# Patient Record
Sex: Male | Born: 1965 | Race: Black or African American | Hispanic: No | Marital: Single | Smoking: Former smoker
Health system: Southern US, Community
[De-identification: ages and names within clinical notes are randomized; demographics above are authoritative.]

## PROBLEM LIST (undated history)

## (undated) DIAGNOSIS — J449 Chronic obstructive pulmonary disease, unspecified: Secondary | ICD-10-CM

## (undated) DIAGNOSIS — I10 Essential (primary) hypertension: Secondary | ICD-10-CM

## (undated) DIAGNOSIS — F32A Depression, unspecified: Secondary | ICD-10-CM

## (undated) DIAGNOSIS — J45909 Unspecified asthma, uncomplicated: Secondary | ICD-10-CM

## (undated) DIAGNOSIS — T7840XA Allergy, unspecified, initial encounter: Secondary | ICD-10-CM

## (undated) HISTORY — DX: Chronic obstructive pulmonary disease, unspecified: J44.9

## (undated) HISTORY — DX: Depression, unspecified: F32.A

## (undated) HISTORY — DX: Unspecified asthma, uncomplicated: J45.909

## (undated) HISTORY — PX: HERNIA REPAIR: SHX51

## (undated) HISTORY — DX: Allergy, unspecified, initial encounter: T78.40XA

---

## 2003-01-01 ENCOUNTER — Emergency Department (HOSPITAL_COMMUNITY): Admission: EM | Admit: 2003-01-01 | Discharge: 2003-01-01 | Payer: Self-pay | Admitting: *Deleted

## 2003-01-02 ENCOUNTER — Inpatient Hospital Stay (HOSPITAL_COMMUNITY): Admission: EM | Admit: 2003-01-02 | Discharge: 2003-01-03 | Payer: Self-pay | Admitting: Internal Medicine

## 2003-03-10 ENCOUNTER — Emergency Department (HOSPITAL_COMMUNITY): Admission: EM | Admit: 2003-03-10 | Discharge: 2003-03-10 | Payer: Self-pay | Admitting: Emergency Medicine

## 2003-05-22 ENCOUNTER — Emergency Department (HOSPITAL_COMMUNITY): Admission: EM | Admit: 2003-05-22 | Discharge: 2003-05-22 | Payer: Self-pay | Admitting: Emergency Medicine

## 2003-05-23 ENCOUNTER — Emergency Department (HOSPITAL_COMMUNITY): Admission: EM | Admit: 2003-05-23 | Discharge: 2003-05-23 | Payer: Self-pay | Admitting: *Deleted

## 2003-05-30 ENCOUNTER — Emergency Department (HOSPITAL_COMMUNITY): Admission: EM | Admit: 2003-05-30 | Discharge: 2003-05-30 | Payer: Self-pay | Admitting: *Deleted

## 2004-05-16 ENCOUNTER — Emergency Department (HOSPITAL_COMMUNITY): Admission: EM | Admit: 2004-05-16 | Discharge: 2004-05-16 | Payer: Self-pay | Admitting: Emergency Medicine

## 2004-07-24 ENCOUNTER — Emergency Department (HOSPITAL_COMMUNITY): Admission: EM | Admit: 2004-07-24 | Discharge: 2004-07-24 | Payer: Self-pay | Admitting: Emergency Medicine

## 2004-09-13 ENCOUNTER — Emergency Department (HOSPITAL_COMMUNITY): Admission: EM | Admit: 2004-09-13 | Discharge: 2004-09-13 | Payer: Self-pay | Admitting: Emergency Medicine

## 2004-09-19 ENCOUNTER — Emergency Department (HOSPITAL_COMMUNITY): Admission: EM | Admit: 2004-09-19 | Discharge: 2004-09-19 | Payer: Self-pay | Admitting: Emergency Medicine

## 2005-03-17 ENCOUNTER — Emergency Department (HOSPITAL_COMMUNITY): Admission: EM | Admit: 2005-03-17 | Discharge: 2005-03-17 | Payer: Self-pay | Admitting: Emergency Medicine

## 2005-06-11 ENCOUNTER — Emergency Department (HOSPITAL_COMMUNITY): Admission: EM | Admit: 2005-06-11 | Discharge: 2005-06-11 | Payer: Self-pay | Admitting: Emergency Medicine

## 2005-10-19 ENCOUNTER — Emergency Department (HOSPITAL_COMMUNITY): Admission: EM | Admit: 2005-10-19 | Discharge: 2005-10-19 | Payer: Self-pay | Admitting: Emergency Medicine

## 2007-04-20 IMAGING — CR DG LUMBAR SPINE COMPLETE 4+V
5 series · 5 of 5 positions shown · non-contrast
Comparison: none

CLINICAL DATA: Low back pain for 3 days.  No known injury.
 THORACIC SPINE - 3 VIEW:
 There is no evidence of thoracic spine fracture.  Alignment is normal.  No other significant bone abnormalities are identified.

[view not recorded (1 of 5)]
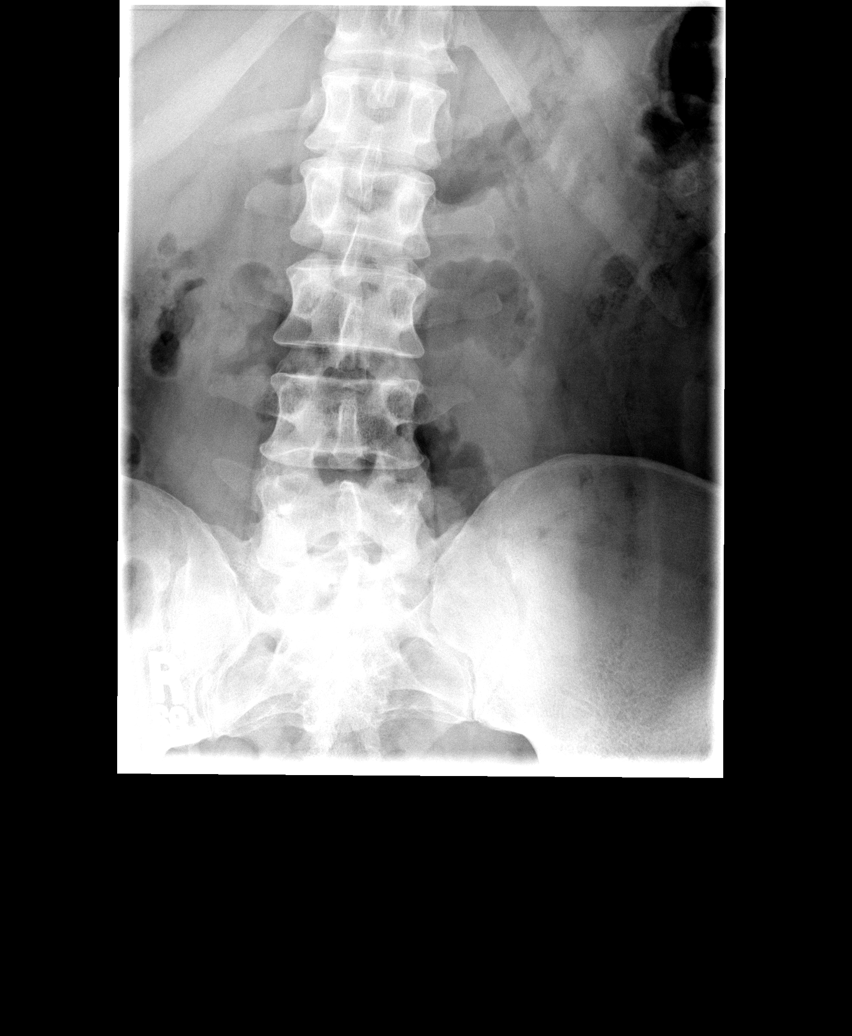

[view not recorded (2 of 5)]
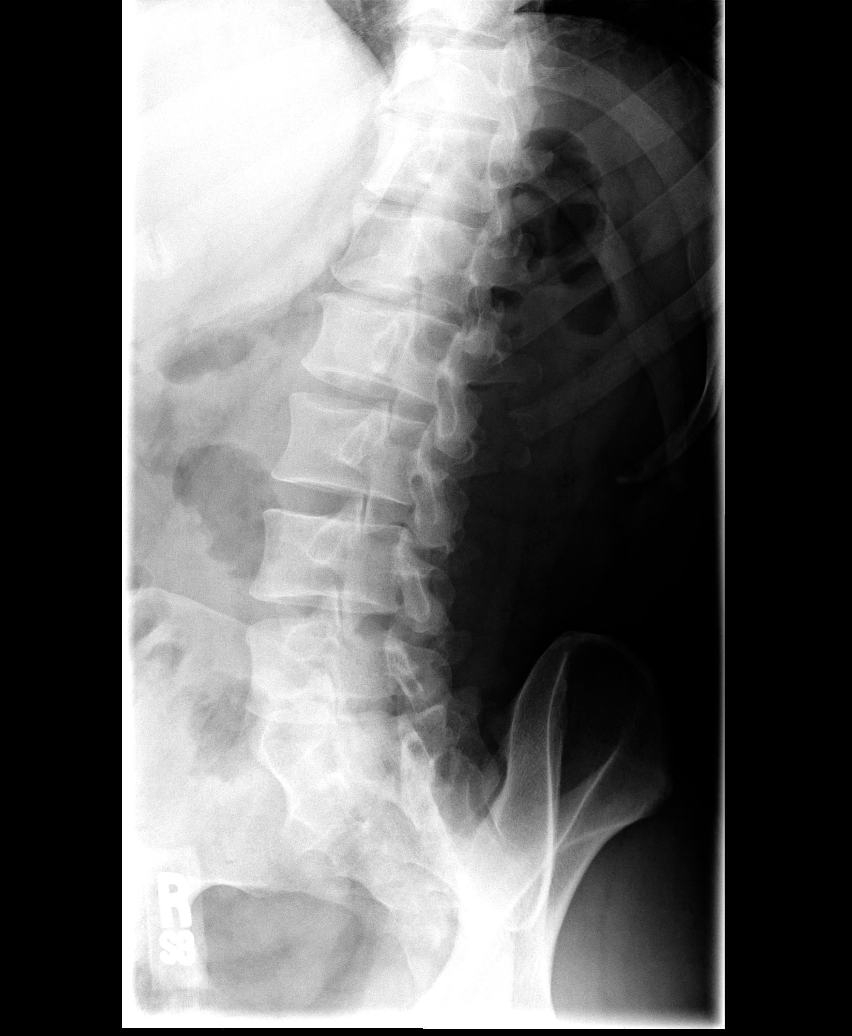

[view not recorded (3 of 5)]
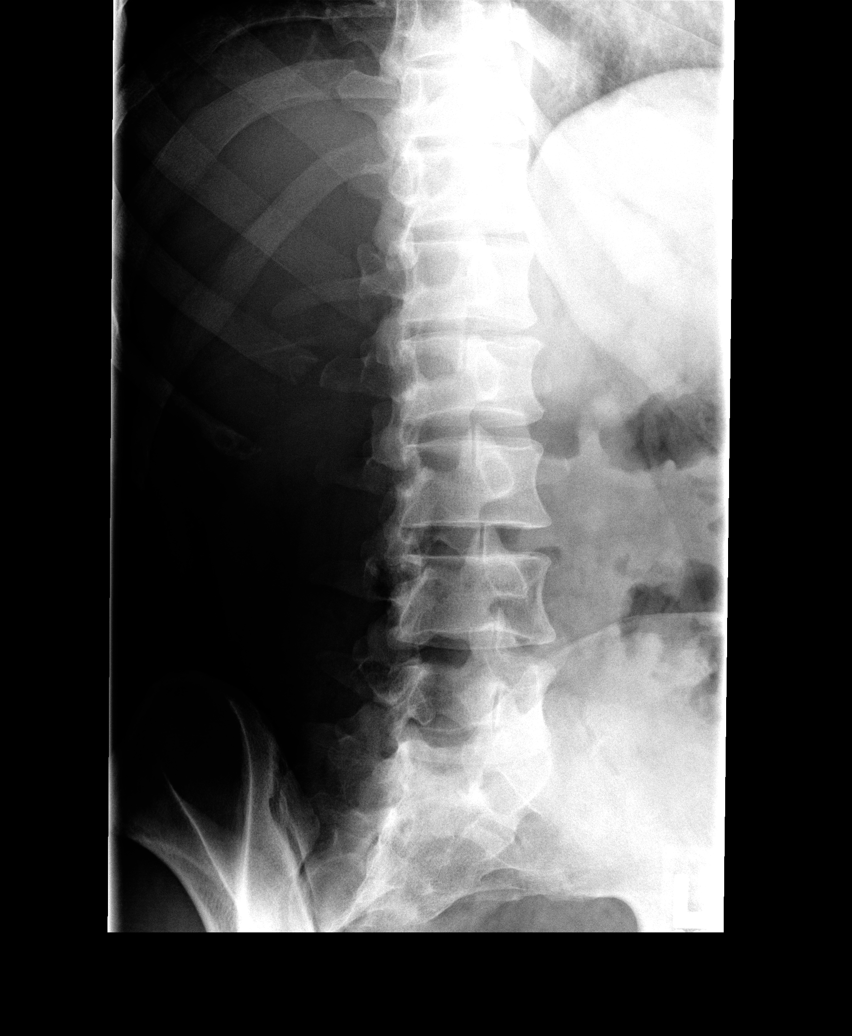

[view not recorded (4 of 5)]
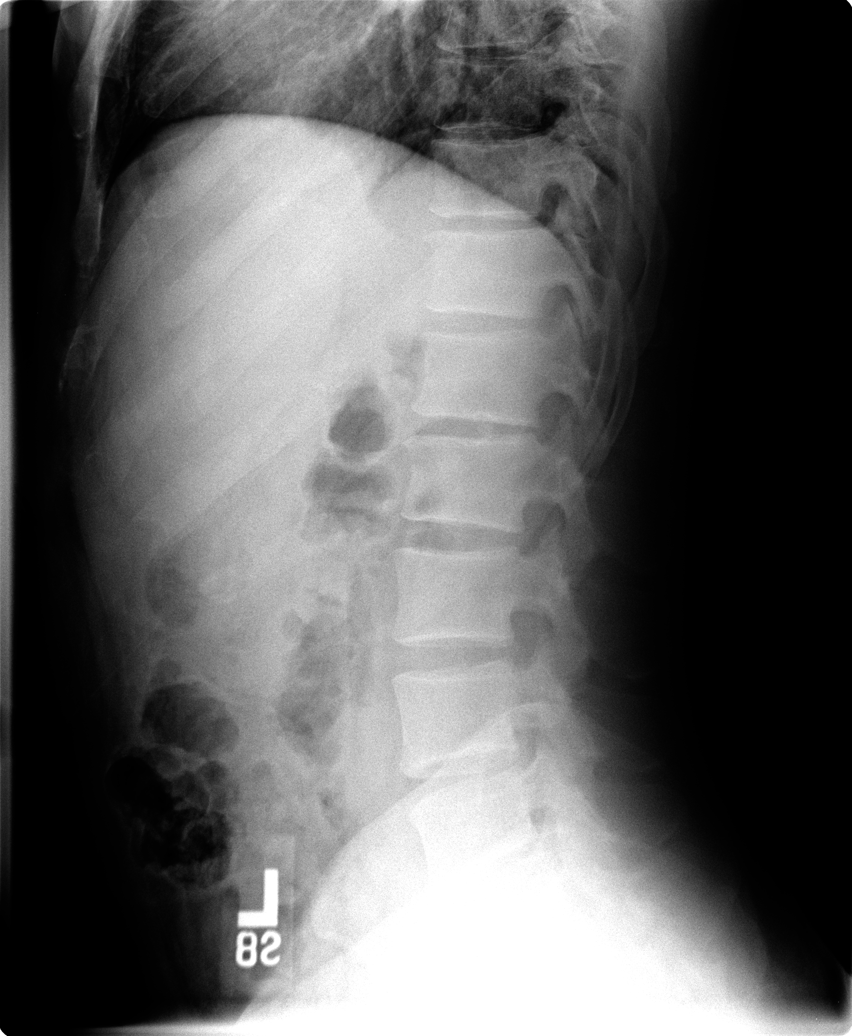

[view not recorded (5 of 5)]
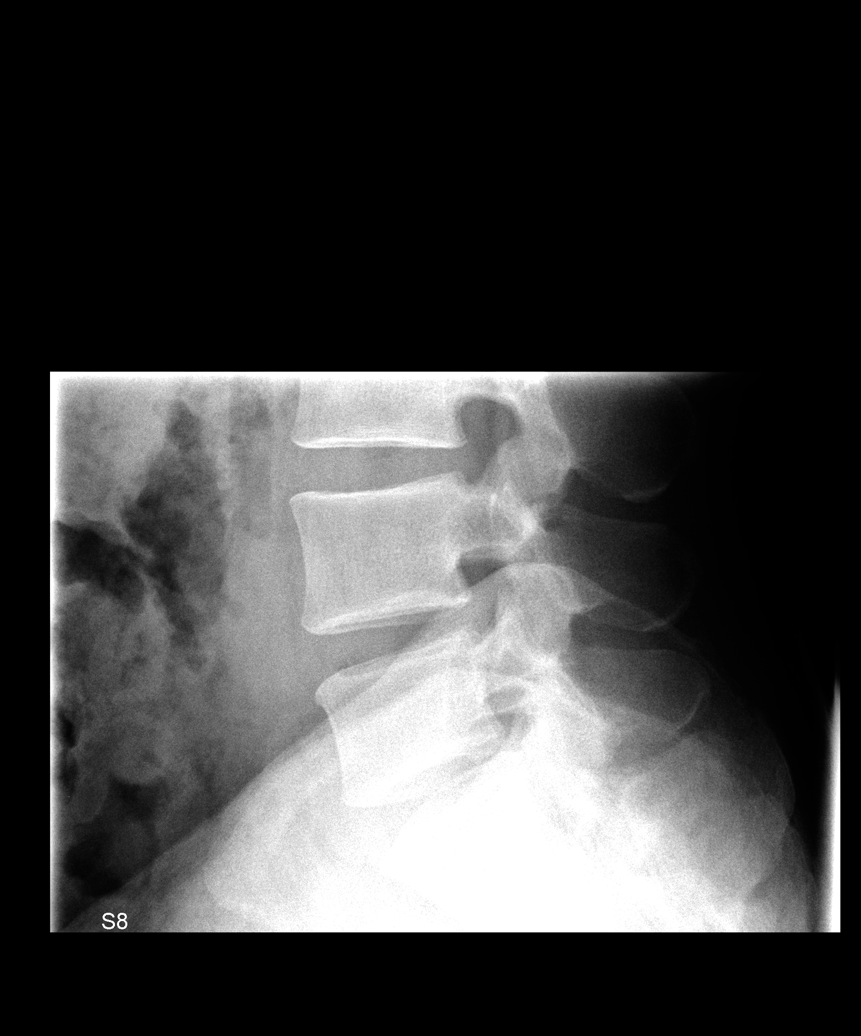

[5 of 5 positions shown; findings below may reference images not displayed]

IMPRESSION: Negative thoracic spine radiographs.
 LUMBAR SPINE - 5 VIEW:
FINDINGS: L1 has a small rudimentary rib on the right.  
 There is no evidence of lumbar spine fracture. Alignment is normal.  Intervertebral disc spaces are maintained, and no other significant bone abnormalities are identified.
IMPRESSION: Negative lumbar spine radiographs.

## 2022-01-04 DIAGNOSIS — I1A0 Resistant hypertension: Secondary | ICD-10-CM | POA: Diagnosis not present

## 2022-01-04 DIAGNOSIS — F4321 Adjustment disorder with depressed mood: Secondary | ICD-10-CM | POA: Diagnosis not present

## 2022-01-04 DIAGNOSIS — F17210 Nicotine dependence, cigarettes, uncomplicated: Secondary | ICD-10-CM | POA: Diagnosis not present

## 2022-01-04 DIAGNOSIS — G4733 Obstructive sleep apnea (adult) (pediatric): Secondary | ICD-10-CM | POA: Diagnosis not present

## 2022-01-04 DIAGNOSIS — M62830 Muscle spasm of back: Secondary | ICD-10-CM | POA: Diagnosis not present

## 2022-01-04 DIAGNOSIS — G8929 Other chronic pain: Secondary | ICD-10-CM | POA: Diagnosis not present

## 2022-01-04 DIAGNOSIS — M545 Low back pain, unspecified: Secondary | ICD-10-CM | POA: Diagnosis not present

## 2022-01-05 DIAGNOSIS — F1090 Alcohol use, unspecified, uncomplicated: Secondary | ICD-10-CM | POA: Diagnosis not present

## 2022-01-05 DIAGNOSIS — F1290 Cannabis use, unspecified, uncomplicated: Secondary | ICD-10-CM | POA: Diagnosis not present

## 2022-01-05 DIAGNOSIS — I1A0 Resistant hypertension: Secondary | ICD-10-CM | POA: Diagnosis not present

## 2022-01-05 DIAGNOSIS — N189 Chronic kidney disease, unspecified: Secondary | ICD-10-CM | POA: Diagnosis not present

## 2022-01-05 DIAGNOSIS — I10 Essential (primary) hypertension: Secondary | ICD-10-CM | POA: Diagnosis not present

## 2022-01-05 DIAGNOSIS — F419 Anxiety disorder, unspecified: Secondary | ICD-10-CM | POA: Diagnosis not present

## 2022-05-12 ENCOUNTER — Emergency Department: Payer: Medicaid - Out of State

## 2022-05-12 ENCOUNTER — Other Ambulatory Visit: Payer: Self-pay

## 2022-05-12 ENCOUNTER — Emergency Department
Admission: EM | Admit: 2022-05-12 | Discharge: 2022-05-12 | Disposition: A | Discharge status: Home / Self Care | Payer: Medicaid - Out of State | Attending: Emergency Medicine | Admitting: Emergency Medicine

## 2022-05-12 DIAGNOSIS — S39012A Strain of muscle, fascia and tendon of lower back, initial encounter: Secondary | ICD-10-CM | POA: Diagnosis not present

## 2022-05-12 DIAGNOSIS — S3992XA Unspecified injury of lower back, initial encounter: Secondary | ICD-10-CM | POA: Diagnosis present

## 2022-05-12 DIAGNOSIS — W1839XA Other fall on same level, initial encounter: Secondary | ICD-10-CM | POA: Diagnosis not present

## 2022-05-12 MED ORDER — KETOROLAC TROMETHAMINE 30 MG/ML IJ SOLN
30.0000 mg | Freq: Once | INTRAMUSCULAR | Status: AC
  Administered 2022-05-12: 30 mg via INTRAMUSCULAR
  Filled 2022-05-12: qty 1

## 2022-05-12 NOTE — Discharge Instructions (Signed)
Please take your Celebrex as prescribed

## 2022-05-12 NOTE — ED Provider Notes (Signed)
   [facility] Provider Note    Event Date/Time   First MD Initiated Contact with Patient 05/12/22 [phone]     (approximate)   History   Back Pain   HPI  Larry Maxwell is a 57 y.o. male who presents with complaints of low back pain.  Patient reports several days ago he had a fall off, fell backwards from standing onto concrete.  Complains of low back pain, does report he has been having back pain prior to that as well.  Occasionally has radiation into his right buttock.  No neurodeficits.  No urinary incontinence.  Complains of pain especially with twisting or bending.  No fevers     Physical Exam   Triage Vital Signs: ED Triage Vitals  Enc Vitals Group     BP 05/12/22 0801 (!) 156/112     Pulse Rate 05/12/22 0801 100     Resp 05/12/22 0801 16     Temp 05/12/22 0802 98.3 F (36.8 C)     Temp Source 05/12/22 0801 Oral     SpO2 05/12/22 0801 100 %     Weight 05/12/22 0800 82.1 kg (181 lb)     Height 05/12/22 0800 1.854 m ( )     Head Circumference --      Peak Flow --      Pain Score --      Pain Loc --      Pain Edu? --      Excl. in GC? --     Most recent vital signs: Vitals:   05/12/22 0801 05/12/22 0802  BP: (!) 156/112   Pulse: 100   Resp: 16   Temp:  98.3 F (36.8 C)  SpO2: 100%      General: Awake, no distress.  CV:  Good peripheral perfusion.  Resp:  Normal effort.  Abd:  No distention.  Other:  Patient ambulating well albeit somewhat stiffly, no vertebral tenderness to palpation, mild paraspinal lumbar tenderness.  Normal strength in the lower extremities.  No saddle anesthesia   ED Results / Procedures / Treatments   Labs (all labs ordered are listed, but only abnormal results are displayed) Labs Reviewed - No data to display   EKG     RADIOLOGY  Lumbar x-ray viewed interpret by me, no acute abnormality   PROCEDURES:  Critical Care performed:   Procedures   MEDICATIONS ORDERED IN ED: Medications   ketorolac (TORADOL) 30 MG/ML injection 30 mg (30 mg Intramuscular Given 05/12/22 0922)     IMPRESSION / MDM / ASSESSMENT AND PLAN / ED COURSE  I reviewed the triage vital signs and the nursing notes. Patient's presentation is most consistent with acute presentation with potential threat to life or bodily function.  Patient presents after a fall with low back pain.  Differential includes fracture, contusion, muscle strain.  Will treat with IM Toradol, obtain x-rays and reevaluate.  ----------------------------------------- 9:55 AM on 05/12/2022 ----------------------------------------- X-rays negative for fracture, patient feeling improved, he has Celebrex at home, he will continue to take this.      FINAL CLINICAL IMPRESSION(S) / ED DIAGNOSES   Final diagnoses:  Strain of lumbar region, initial encounter     Rx / DC Orders   ED Discharge Orders     None        Note:  This document was prepared using Dragon voice recognition software and may include unintentional dictation errors.   Larry Every, MD 05/12/22 [phone]

## 2022-05-12 NOTE — ED Triage Notes (Signed)
Pt states low back pain off and on for 12 years. Pt states he fell on Saturday and now has increased pain. Pt ambulatory without difficulty, denies hematuria, dysuria or fever.

## 2022-06-02 ENCOUNTER — Emergency Department
Admission: EM | Admit: 2022-06-02 | Discharge: 2022-06-02 | Disposition: A | Discharge status: Home / Self Care | Payer: Medicaid - Out of State | Attending: Emergency Medicine | Admitting: Emergency Medicine

## 2022-06-02 ENCOUNTER — Encounter: Payer: Self-pay | Admitting: Emergency Medicine

## 2022-06-02 ENCOUNTER — Other Ambulatory Visit: Payer: Self-pay

## 2022-06-02 ENCOUNTER — Emergency Department: Payer: Medicaid - Out of State

## 2022-06-02 DIAGNOSIS — M7989 Other specified soft tissue disorders: Secondary | ICD-10-CM | POA: Diagnosis present

## 2022-06-02 DIAGNOSIS — N62 Hypertrophy of breast: Secondary | ICD-10-CM | POA: Insufficient documentation

## 2022-06-02 DIAGNOSIS — I10 Essential (primary) hypertension: Secondary | ICD-10-CM | POA: Insufficient documentation

## 2022-06-02 DIAGNOSIS — M7122 Synovial cyst of popliteal space [Baker], left knee: Secondary | ICD-10-CM | POA: Diagnosis not present

## 2022-06-02 DIAGNOSIS — N63 Unspecified lump in unspecified breast: Secondary | ICD-10-CM

## 2022-06-02 HISTORY — DX: Essential (primary) hypertension: I10

## 2022-06-02 MED ORDER — NAPROXEN 500 MG PO TABS: 500.0000 mg | ORAL_TABLET | Freq: Two times a day (BID) | ORAL | 0 refills | Status: AC

## 2022-06-02 MED ORDER — AMLODIPINE BESYLATE 10 MG PO TABS: 10.0000 mg | ORAL_TABLET | Freq: Every day | ORAL | 0 refills | Status: DC

## 2022-06-02 MED ORDER — CHLORTHALIDONE 25 MG PO TABS: 25.0000 mg | ORAL_TABLET | Freq: Every day | ORAL | 0 refills | Status: DC

## 2022-06-02 MED ORDER — KETOROLAC TROMETHAMINE 60 MG/2ML IM SOLN
30.0000 mg | Freq: Once | INTRAMUSCULAR | Status: AC
  Administered 2022-06-02: 30 mg via INTRAMUSCULAR
  Filled 2022-06-02: qty 2

## 2022-06-02 MED ORDER — CARVEDILOL 12.5 MG PO TABS: 12.5000 mg | ORAL_TABLET | Freq: Two times a day (BID) | ORAL | 0 refills | Status: DC

## 2022-06-02 NOTE — ED Provider Notes (Signed)
[facility] Provider Note    Event Date/Time   First MD Initiated Contact with Patient 06/02/22 [phone]     (approximate)   History   Abscess and Leg Pain   HPI  Larry Maxwell is a 57 y.o. male presenting to the emergency department for evaluation of left breast swelling and left knee pain.  He reports that for several months he has noticed some swelling underneath the nipple over his left breast.  He does feel like the swelling is getting worse and he has associated tenderness.  Has not been seen for this previously.   In addition, a few weeks ago he noted some pain behind his left knee that he thinks is related to his "veins".  Denies history of similar.  No identifiable trauma.  Is still ambulatory.  No history of VTE.     Physical Exam   Triage Vital Signs: ED Triage Vitals  Enc Vitals Group     BP 06/02/22 0821 (!) 170/109     Pulse Rate 06/02/22 0821 74     Resp 06/02/22 0821 16     Temp 06/02/22 0821 98.5 F (36.9 C)     Temp Source 06/02/22 0821 Oral     SpO2 06/02/22 0821 99 %     Weight 06/02/22 0823 199 lb (90.3 kg)     Height 06/02/22 0823 6\' 1"  (1.854 m)     Head Circumference --      Peak Flow --      Pain Score 06/02/22 0822 8     Pain Loc --      Pain Edu? --      Excl. in GC? --     Most recent vital signs: Vitals:   06/02/22 0821 06/02/22 1318  BP: (!) 170/109 (!) 185/110  Pulse: 74 60  Resp: 16 15  Temp: 98.5 F (36.9 C) 98.2 F (36.8 C)  SpO2: 99% 100%     General: Awake, interactive  CV:  Regular rate, good peripheral perfusion.  Resp:  Lungs clear, unlabored respirations.  Breast:  There is a well-circumscribed area of swelling underneath the left nipple without significant associated asymmetry.  There is tenderness over the area.  No significant erythema, though limited exam due to patient's skin tone. Abd:  Soft, nondistended.  Neuro:  Symmetric facial movement, fluid speech MSK:   There is tenderness over  the posterior knee with some associated swelling.  Full range of motion.  No significant swelling over the distal extremity.  2+ DP pulses bilaterally.    ED Results / Procedures / Treatments   Labs (all labs ordered are listed, but only abnormal results are displayed) Labs Reviewed - No data to display   RADIOLOGY Imaging independently reviewed and interpreted by myself demonstrates: Lower extremity ultrasound without evidence of DVT.  Breast ultrasound with well-circumscribed area at the location of the pain.  Per radiology read, most consistent with benign gynecomastia, no evidence of abscess.  PROCEDURES:  Critical Care performed: No  Procedures   MEDICATIONS ORDERED IN ED: Medications  ketorolac (TORADOL) injection 30 mg (30 mg Intramuscular Given 06/02/22 1306)     IMPRESSION / MDM / ASSESSMENT AND PLAN / ED COURSE  I reviewed the triage vital signs and the nursing notes.  Differential diagnosis includes, but is not limited to, breast mass including both benign and malignant causes, abscess, for knee, differential includes DVT, SVT, Baker's cyst.   Patient's presentation is most consistent with acute presentation  with potential threat to life or bodily function.  57 year old male presenting with breast and knee pain.  Regarding breast pain, appears to be benign gynecomastia.  Per radiology recommendation, patient instructed to have outpatient diagnostic mammography.  Regarding knee pain, venous ultrasound without evidence of DVT.  Based on clinical location appearance suspect likely Baker's cyst.  Did discuss supportive care.  On reevaluation, patient reports that he has been out of multiple antihypertensive medications.  He was able to pull up the medications that he is supposed to be on his phone.  He was given a prescription for these.  He was also given a prescription for naproxen for management of his Baker's cyst.  Strict return precautions were provided.  Patient was  discharged in stable condition.     FINAL CLINICAL IMPRESSION(S) / ED DIAGNOSES   Final diagnoses:  Gynecomastia  Baker cyst, left  Uncontrolled hypertension     Rx / DC Orders   ED Discharge Orders          Ordered    naproxen (NAPROSYN) 500 MG tablet  2 times daily with meals        06/02/22 1334    chlorthalidone (HYGROTON) 25 MG tablet  Daily        06/02/22 1334    carvedilol (COREG) 12.5 MG tablet  2 times daily        06/02/22 1334    amLODipine (NORVASC) 10 MG tablet  Daily        06/02/22 1334             Note:  This document was prepared using Dragon voice recognition software and may include unintentional dictation errors.   Trinna Post, MD 06/02/22 [phone]

## 2022-06-02 NOTE — ED Notes (Signed)
E-signature pad not working. Pt verbalized understanding of MSE wavier.

## 2022-06-02 NOTE — ED Triage Notes (Signed)
Pt to ED via POV for an abscess on left breast that has been there for about 1 month. Pt reports that it has started to get larger. Pt is also c/o pain behind his left knee. Pt states that he feels like it is "broken veins". Pt reports that he has had the pain for about 1 week. Pt is in NAD.

## 2022-06-02 NOTE — Discharge Instructions (Addendum)
You were seen in the emergency room today for evaluation of swelling of your breast.  Your ultrasound showed that you have what appears to be benign swelling of the breast known as gynecomastia.  However, it is important that you use the number above to arrange follow-up for an outpatient mammogram.  Your ultrasound of your leg did not show signs of a DVT.  I suspect that you likely have pain secondary to what is known as a Baker's cyst.  This does usually improve with time.  I have sent a prescription for an anti-inflammatory to your pharmacy that you can take as needed.  I have also sent refills for your blood pressure medications.  Follow-up with your primary care doctor soon as possible for further evaluation.  Return to the ER for new or worsening symptoms.

## 2022-07-15 NOTE — Progress Notes (Deleted)
Larry Maxwell,acting as a Neurosurgeon for [employer], PA-C.,have documented all relevant documentation on the behalf of Debera Lat, PA-C,as directed by  [employer], PA-C while in the presence of [employer], PA-C.   New patient visit   Patient: Larry Maxwell   DOB: 02/08/1965   57 y.o. Male  MRN: [id number] Visit Date: 07/16/2022  Today's healthcare provider: Debera Lat, PA-C   No chief complaint on file.  Subjective    Larry Maxwell is a 57 y.o. male who presents today as a new patient to establish care.  HPI  ***  Past Medical History:  Diagnosis Date   Hypertension    No past surgical history on file. No family status information on file.   No family history on file. Social History   Socioeconomic History   Marital status: Single    Spouse name: Not on file   Number of children: Not on file   Years of education: Not on file   Highest education level: Not on file  Occupational History   Not on file  Tobacco Use   Smoking status: Never   Smokeless tobacco: Never  Substance and Sexual Activity   Alcohol use: Not Currently   Drug use: Not Currently   Sexual activity: Not on file  Other Topics Concern   Not on file  Social History Narrative   Not on file   Social Determinants of Health   Financial Resource Strain: Not on file  Food Insecurity: Not on file  Transportation Needs: Not on file  Physical Activity: Not on file  Stress: Not on file  Social Connections: Not on file   Outpatient Medications Prior to Visit  Medication Sig   amLODipine (NORVASC) 10 MG tablet Take 1 tablet (10 mg total) by mouth daily for 30 doses.   carvedilol (COREG) 12.5 MG tablet Take 1 tablet (12.5 mg total) by mouth 2 (two) times daily.   chlorthalidone (HYGROTON) 25 MG tablet Take 1 tablet (25 mg total) by mouth daily.   No facility-administered medications prior to visit.   Allergies  Allergen Reactions   Lisinopril      There is no immunization  history on file for this patient.  Health Maintenance  Topic Date Due   COVID-19 Vaccine (1) Never done   HIV Screening  Never done   Hepatitis C Screening  Never done   DTaP/Tdap/Td (1 - Tdap) Never done   Colonoscopy  Never done   Zoster Vaccines- Shingrix (1 of 2) Never done   INFLUENZA VACCINE  08/26/2022   HPV VACCINES  Aged Out    Patient Care Team: Pcp, No as PCP - General  Review of Systems  Constitutional: Negative.   HENT: Negative.    Eyes: Negative.   Respiratory: Negative.    Cardiovascular: Negative.   Gastrointestinal: Negative.   Endocrine: Negative.   Genitourinary: Negative.   Musculoskeletal: Negative.   Skin: Negative.   Allergic/Immunologic: Negative.   Neurological: Negative.   Hematological: Negative.   Psychiatric/Behavioral: Negative.      {Labs  Heme  Chem  Endocrine  Serology  Results Review (optional):23779}   Objective    There were no vitals taken for this visit. {Show previous vital signs (optional):23777}  Physical Exam ***  Depression Screen     No data to display         No results found for any visits on 07/16/22.  Assessment & Plan     ***  No follow-ups  on file.     {provider attestation***:1}   Debera Lat, PA-C  [facility] [phone] (phone) [phone] (fax)  [facility]

## 2022-07-16 ENCOUNTER — Ambulatory Visit: Payer: Self-pay | Admitting: Physician Assistant

## 2022-07-21 ENCOUNTER — Encounter: Payer: Self-pay | Admitting: Family Medicine

## 2022-07-21 ENCOUNTER — Other Ambulatory Visit: Payer: Self-pay | Admitting: Family Medicine

## 2022-07-21 ENCOUNTER — Ambulatory Visit (INDEPENDENT_AMBULATORY_CARE_PROVIDER_SITE_OTHER): Payer: BLUE CROSS/BLUE SHIELD | Admitting: Family Medicine

## 2022-07-21 VITALS — BP 151/92 | HR 80 | Temp 97.8°F | Resp 12 | Ht 73.5 in | Wt 187.5 lb

## 2022-07-21 DIAGNOSIS — F321 Major depressive disorder, single episode, moderate: Secondary | ICD-10-CM | POA: Diagnosis not present

## 2022-07-21 DIAGNOSIS — Z7689 Persons encountering health services in other specified circumstances: Secondary | ICD-10-CM

## 2022-07-21 DIAGNOSIS — N62 Hypertrophy of breast: Secondary | ICD-10-CM

## 2022-07-21 DIAGNOSIS — I10 Essential (primary) hypertension: Secondary | ICD-10-CM | POA: Diagnosis not present

## 2022-07-21 DIAGNOSIS — M5442 Lumbago with sciatica, left side: Secondary | ICD-10-CM | POA: Diagnosis not present

## 2022-07-21 DIAGNOSIS — J4530 Mild persistent asthma, uncomplicated: Secondary | ICD-10-CM | POA: Diagnosis not present

## 2022-07-21 DIAGNOSIS — M79609 Pain in unspecified limb: Secondary | ICD-10-CM

## 2022-07-21 DIAGNOSIS — F339 Major depressive disorder, recurrent, unspecified: Secondary | ICD-10-CM | POA: Insufficient documentation

## 2022-07-21 DIAGNOSIS — R202 Paresthesia of skin: Secondary | ICD-10-CM

## 2022-07-21 DIAGNOSIS — Z1211 Encounter for screening for malignant neoplasm of colon: Secondary | ICD-10-CM | POA: Diagnosis not present

## 2022-07-21 DIAGNOSIS — G8929 Other chronic pain: Secondary | ICD-10-CM

## 2022-07-21 DIAGNOSIS — E782 Mixed hyperlipidemia: Secondary | ICD-10-CM

## 2022-07-21 DIAGNOSIS — Z0000 Encounter for general adult medical examination without abnormal findings: Secondary | ICD-10-CM | POA: Diagnosis not present

## 2022-07-21 HISTORY — DX: Hypertrophy of breast: N62

## 2022-07-21 MED ORDER — ROSUVASTATIN CALCIUM 10 MG PO TABS
10.0000 mg | ORAL_TABLET | Freq: Every day | ORAL | 3 refills | Status: DC
Start: 2022-07-21 — End: 2023-10-02
  Filled 2022-12-20: qty 90, 90d supply, fill #0
  Filled 2023-03-26 – 2023-03-31 (×3): qty 90, 90d supply, fill #1
  Filled 2023-07-07: qty 90, 90d supply, fill #2

## 2022-07-21 MED ORDER — CETIRIZINE HCL 10 MG PO TABS
10.0000 mg | ORAL_TABLET | Freq: Every day | ORAL | 11 refills | Status: DC
Start: 2022-07-21 — End: 2023-03-29

## 2022-07-21 MED ORDER — VALSARTAN 40 MG PO TABS
40.0000 mg | ORAL_TABLET | Freq: Every day | ORAL | 0 refills | Status: DC
Start: 2022-07-21 — End: 2022-08-05

## 2022-07-21 MED ORDER — ESCITALOPRAM OXALATE 10 MG PO TABS
10.0000 mg | ORAL_TABLET | Freq: Every day | ORAL | 0 refills | Status: DC
Start: 2022-07-21 — End: 2022-08-05

## 2022-07-21 MED ORDER — CHLORTHALIDONE 25 MG PO TABS
25.0000 mg | ORAL_TABLET | Freq: Every day | ORAL | 0 refills | Status: DC
Start: 2022-07-21 — End: 2022-08-13

## 2022-07-21 MED ORDER — PREGABALIN 100 MG PO CAPS: 100.0000 mg | ORAL_CAPSULE | Freq: Two times a day (BID) | ORAL | 0 refills | Status: DC

## 2022-07-21 MED ORDER — CARVEDILOL 12.5 MG PO TABS
12.5000 mg | ORAL_TABLET | Freq: Two times a day (BID) | ORAL | 0 refills | Status: AC
Start: 2022-07-21 — End: 2022-08-20

## 2022-07-21 MED ORDER — AMLODIPINE BESYLATE 10 MG PO TABS
10.0000 mg | ORAL_TABLET | Freq: Every day | ORAL | 0 refills | Status: DC
Start: 2022-07-21 — End: 2022-08-05

## 2022-07-21 MED ORDER — ALBUTEROL SULFATE HFA 108 (90 BASE) MCG/ACT IN AERS
1.0000 | INHALATION_SPRAY | Freq: Four times a day (QID) | RESPIRATORY_TRACT | 0 refills | Status: AC | PRN
Start: 2022-07-21 — End: ?

## 2022-07-21 MED ORDER — BUDESONIDE-FORMOTEROL FUMARATE 80-4.5 MCG/ACT IN AERO
2.0000 | INHALATION_SPRAY | Freq: Two times a day (BID) | RESPIRATORY_TRACT | 3 refills | Status: DC
Start: 2022-07-21 — End: 2022-08-17

## 2022-07-21 NOTE — Patient Instructions (Signed)
Only start the valsartan if you blood pressure remains above 125 (top number) in 1 week.

## 2022-07-21 NOTE — Progress Notes (Signed)
I,Larry Maxwell,acting as a Neurosurgeon for [employer], DO.,have documented all relevant documentation on the behalf of [employer], DO,as directed by  [employer], DO while in the presence of Larry Malcomb N Jeremaine Maraj, DO.   New patient visit   Patient: Larry Maxwell   DOB: [birth year]   57 y.o. Male  MRN: [id number] Visit Date: 07/21/2022  Today's healthcare provider: Sherlyn Hay, DO   Chief Complaint  Patient presents with   New Patient (Initial Visit)   Subjective    Larry Maxwell is a 57 y.o. male who presents today as a new patient to establish care.  HPI  Patient moved here from [place] about 2 months ago.   He reports lumps behind his left breast. He reports very painful and swollen. He denies any rash or discharge from nipple. He reports going to ED and was advised to get a diagnostic mammography. First noticed around March 2024; hurts persistently. Alleviated somewhat with heat pack  Patient reports he has been out of all his medication for over a week. He then stated he has some amlodipine left but has not been taking it every day due to spacing it out to try to keep his blood pressure somewhat under control.  He did take it this morning.  Anx/dep: Married 18 years, then he found out approximately 6 to 7 months ago that his wife cheated on him.  - He went with his daughter to [place] to get a cat, which has helped a lot with emotional support -Denies SI/HI  Recent spironolactone use?  Yes until he ran out. Dr. Earl Maxwell in [place] prescribed it. Stays away form salt, doesn't eat pork.   Back pain: Worked for [employer] and lifted a lot of heavy things. Had a lot of back pain. Saw. Dr. Trevor Maxwell and got back injection; had sciatica done left leg as well. - Back hurts regularly; especially after laying down for three hours or more.  Back  injections lasted 20 days Hurts right in crease of leg (posterior to knee) when pain occurs from back  He doesn't remember if he saw  a heart doctor while in [place]  Asthma - at night, gets choked up and can't breathe, so he sits in front a of fan - has significant coughing episodes; tripods in front of fan to catch his breath.  - has not used any other inhalers; this problem occurs about three times per week. - Pollen seems to worsen it - Coughs a whole lot phlegm up ACT score - 16, poorly controlled asthma - Takes claritin right before going outside; sometimes takes two. Not helping  EVALUATE FOR SECONDARY HYPTENTENSION?????     Past Medical History:  Diagnosis Date   Allergy    Asthma    Depression    Hypertension    Past Surgical History:  Procedure Laterality Date   HERNIA REPAIR     No family status information on file.   History reviewed. No pertinent family history. Social History   Socioeconomic History   Marital status: Single    Spouse name: Not on file   Number of children: Not on file   Years of education: Not on file   Highest education level: Not on file  Occupational History   Not on file  Tobacco Use   Smoking status: Former    Packs/day: 1.50    Years: 20.00    Additional pack years: 0.00    Total pack years:  30.00    Types: Cigarettes    Quit date: 12/19/2021    Years since quitting: 0.5   Smokeless tobacco: Never  Vaping Use   Vaping Use: Never used  Substance and Sexual Activity   Alcohol use: Not Currently   Drug use: Not Currently   Sexual activity: Not on file  Other Topics Concern   Not on file  Social History Narrative   Not on file   Social Determinants of Health   Financial Resource Strain: Not on file  Food Insecurity: Not on file  Transportation Needs: Not on file  Physical Activity: Not on file  Stress: Not on file  Social Connections: Not on file   Outpatient Medications Prior to Visit  Medication Sig Note   fluticasone (FLONASE) 50 MCG/ACT nasal spray Place 1 spray into both nostrils daily.    methocarbamol (ROBAXIN) 500 MG tablet Take 500 mg  by mouth 4 (four) times daily.    [DISCONTINUED] albuterol (VENTOLIN HFA) 108 (90 Base) MCG/ACT inhaler Inhale 1 puff into the lungs every 6 (six) hours as needed for wheezing or shortness of breath.    [DISCONTINUED] amLODipine (NORVASC) 10 MG tablet Take 1 tablet (10 mg total) by mouth daily for 30 doses.    [DISCONTINUED] atorvastatin (LIPITOR) 20 MG tablet Take 20 mg by mouth daily.    [DISCONTINUED] carvedilol (COREG) 12.5 MG tablet Take 1 tablet (12.5 mg total) by mouth 2 (two) times daily.    [DISCONTINUED] celecoxib (CELEBREX) 100 MG capsule Take 100 mg by mouth 2 (two) times daily.    [DISCONTINUED] chlorthalidone (HYGROTON) 25 MG tablet Take 1 tablet (25 mg total) by mouth daily.    [DISCONTINUED] loratadine (CLARITIN) 10 MG tablet Take 10 mg by mouth daily.    [DISCONTINUED] naproxen (NAPROSYN) 500 MG tablet Take 500 mg by mouth 2 (two) times daily with a meal.    [DISCONTINUED] pregabalin (LYRICA) 100 MG capsule Take 100 mg by mouth daily.    [DISCONTINUED] spironolactone (ALDACTONE) 100 MG tablet Take 100 mg by mouth daily. 07/21/2022: gynecomastia   No facility-administered medications prior to visit.   Allergies  Allergen Reactions   Lisinopril Swelling    Angioedema and significant swelling in ankles as well     There is no immunization history on file for this patient.  Health Maintenance  Topic Date Due   COVID-19 Vaccine (1) Never done   HIV Screening  Never done   Hepatitis C Screening  Never done   DTaP/Tdap/Td (1 - Tdap) Never done   Colonoscopy  Never done   Lung Cancer Screening  Never done   Zoster Vaccines- Shingrix (1 of 2) Never done   INFLUENZA VACCINE  08/26/2022   HPV VACCINES  Aged Out    Patient Care Team: Larry Maxwell, Larry Blitz, DO as PCP - General (Family Medicine)  Review of Systems  Respiratory:  Positive for cough and choking.   Musculoskeletal:  Positive for back pain.  Allergic/Immunologic: Positive for environmental allergies.  Neurological:   Positive for headaches.  Psychiatric/Behavioral:  Positive for dysphoric mood and sleep disturbance. The patient is nervous/anxious.   All other systems reviewed and are negative.      Objective    BP (!) 151/92 (BP Location: Left Arm, Patient Position: Sitting, Cuff Size: Large)   Pulse 80   Temp 97.8 F (36.6 C) (Temporal)   Resp 12   Ht 6' 1.5" (1.867 m)   Wt 187 lb 8 oz (85 kg)   SpO2 98%  BMI 24.40 kg/m  BP Readings from Last 3 Encounters:  07/21/22 (!) 151/92  06/02/22 (!) 185/110  05/12/22 (!) 156/112   Wt Readings from Last 3 Encounters:  07/21/22 187 lb 8 oz (85 kg)  06/02/22 199 lb (90.3 kg)  05/12/22 181 lb (82.1 kg)      Physical Exam Vitals reviewed.  Constitutional:      General: He is not in acute distress.    Appearance: Normal appearance. He is well-developed. He is not diaphoretic.  HENT:     Head: Normocephalic and atraumatic.     Mouth/Throat:     Pharynx: No oropharyngeal exudate.  Eyes:     General: No scleral icterus.    Conjunctiva/sclera: Conjunctivae normal.  Neck:     Thyroid: No thyromegaly.  Cardiovascular:     Rate and Rhythm: Normal rate and regular rhythm.     Pulses: Normal pulses.     Heart sounds: Normal heart sounds. No murmur heard. Pulmonary:     Effort: Pulmonary effort is normal. No respiratory distress.     Breath sounds: Normal breath sounds. No wheezing, rhonchi or rales.  Chest:  Breasts:    Left: Tenderness present. No mass (Increased size relative to right).  Abdominal:     General: There is no distension.     Palpations: Abdomen is soft.     Tenderness: There is no abdominal tenderness.  Musculoskeletal:        General: No deformity.     Cervical back: Neck supple.     Right lower leg: No edema.     Left lower leg: No edema.  Lymphadenopathy:     Cervical: No cervical adenopathy.  Skin:    General: Skin is warm and dry.     Findings: No rash.  Neurological:     Mental Status: He is alert and  oriented to person, place, and time. Mental status is at baseline.     Gait: Gait normal.  Psychiatric:        Mood and Affect: Mood normal.        Behavior: Behavior normal.        Thought Content: Thought content normal.     Depression Screen    07/21/2022   10:46 AM  PHQ 2/9 Scores  PHQ - 2 Score 4  PHQ- 9 Score 14   No results found for any visits on 07/21/22.  Assessment & Plan     1. Establishing care with new doctor, encounter for  2. Annual physical exam Overall benign physical exam except as noted.  Check routine blood work and screening for HIV and hepatitis C today.  Will request records from previous physicians today. - Comprehensive metabolic panel - Lipid panel - TSH Rfx on Abnormal to Free T4 - HCV Ab w Reflex to Quant PCR - HIV Antibody (routine testing w rflx)  3. Primary hypertension Will check a urine microalbumin/creatinine ratio.  Refilled patient's medications as noted below.  Due to concerns surrounding potential compliance and desire to avoid causing patient to become hypotensive, advised patient to start valsartan only if his systolic pressure remains above 1 1:25 week. - Microalbumin / creatinine urine ratio - amLODipine (NORVASC) 10 MG tablet; Take 1 tablet (10 mg total) by mouth daily for 30 doses.  Dispense: 30 tablet; Refill: 0 - chlorthalidone (HYGROTON) 25 MG tablet; Take 1 tablet (25 mg total) by mouth daily.  Dispense: 30 tablet; Refill: 0 - carvedilol (COREG) 12.5 MG tablet; Take 1 tablet (12.5  mg total) by mouth 2 (two) times daily.  Dispense: 60 tablet; Refill: 0 - valsartan (DIOVAN) 40 MG tablet; Take 1 tablet (40 mg total) by mouth daily. Start taking after one week, if top BP number is >125  Dispense: 30 tablet; Refill: 0  4. Colon cancer screening - Ambulatory referral to Gastroenterology  5. Paresthesia and pain of left extremity Notes numbness behind his left leg. - Vitamin B12  6. Asthma, allergic, mild persistent,  uncomplicated Patient reports history of asthma but has only ever taken an albuterol inhaler.  Prescribed Symbicort for better control, given his frequent and persistent symptoms.  Also started him on cetirizine and advised him that he needs to take it every day to have adequate control, rather than as needed.  Also ordered pulmonary function test for further qualification of his symptoms. - cetirizine (ZYRTEC) 10 MG tablet; Take 1 tablet (10 mg total) by mouth daily.  Dispense: 30 tablet; Refill: 11 - Pulmonary function test - albuterol (VENTOLIN HFA) 108 (90 Base) MCG/ACT inhaler; Inhale 1 puff into the lungs every 6 (six) hours as needed for wheezing or shortness of breath.  Dispense: 1 each; Refill: 0 - budesonide-formoterol (SYMBICORT) 80-4.5 MCG/ACT inhaler; Inhale 2 puffs into the lungs 2 (two) times daily.  Dispense: 1 each; Refill: 3  7. Gynecomastia, male Patient has an area of tenderness with enlargement under his left breast.  Ordered mammogram as noted below. - MM 3D DIAGNOSTIC MAMMOGRAM BILATERAL BREAST; Future  8. Depression, major, single episode, moderate (HCC) Discussed options for treatment with patient, giving a longstanding nature of his experience/current depression.  Patient was amenable to referral for therapy, as well as starting on escitalopram as noted below.  Will reevaluate at next visit. - Ambulatory referral to Psychology - escitalopram (LEXAPRO) 10 MG tablet; Take 1 tablet (10 mg total) by mouth daily.  Dispense: 30 tablet; Refill: 0  9. Mixed hyperlipidemia Will address patient's cholesterol levels by sending rosuvastatin in place of atorvastatin due to potential for increased efficacy and reduced likelihood of side effects. - rosuvastatin (CRESTOR) 10 MG tablet; Take 1 tablet (10 mg total) by mouth daily.  Dispense: 90 tablet; Refill: 3  10. Chronic midline low back pain with left-sided sciatica Will send prescription for pregabalin to address patient's  significant low back pain.  Will also refer to orthopedics for additional treatment options. - pregabalin (LYRICA) 100 MG capsule; Take 1 capsule (100 mg total) by mouth 2 (two) times daily.  Dispense: 60 capsule; Refill: 0 - Ambulatory referral to Orthopedic Surgery   Return in about 2 weeks (around 08/04/2022).      Total time was 75 minutes. That includes chart review before the visit, the actual patient visit, and time spent on documentation after the visit.  This does not include time spent on the physical exam.   The entirety of the information documented in the History of Present Illness, Review of Systems and Physical Exam were personally obtained by me. Portions of this information were initially documented by the CMA, Hyacinth Meeker, and reviewed by me for thoroughness and accuracy.   I discussed the assessment and treatment plan with the patient  The patient was provided an opportunity to ask questions and all were answered. The patient agreed with the plan and demonstrated an understanding of the instructions.   The patient was advised to call back or seek an in-person evaluation if the symptoms worsen or if the condition fails to improve as anticipated.  Larry Hay, DO  [facility] : [facility] [phone] (phone) [phone] (fax)  [facility]

## 2022-07-22 ENCOUNTER — Encounter: Payer: Self-pay | Admitting: Family Medicine

## 2022-07-22 ENCOUNTER — Telehealth: Payer: Self-pay | Admitting: Family Medicine

## 2022-07-22 LAB — MICROALBUMIN / CREATININE URINE RATIO
Creatinine, Urine: 45.4 mg/dL
Microalb/Creat Ratio: <7 mg/g creat (ref 0–29)
Microalbumin, Urine: <3 ug/mL

## 2022-07-22 NOTE — Telephone Encounter (Signed)
Patient called in stating [facility] is still waiting for a prior authorization approval for pregabalin (LYRICA) 100 MG capsule. Spoke with Jaclynn Guarneri at [facility], and per Jaclynn Guarneri, all medications for patient will be ready for pick up in 2 hours with the exception of Lyrica. Per Jaclynn Guarneri, [facility] is still waiting for the prior auth for medication. Patient is aware of the above.  Please document the status of the PA for medication and please notify the patient of the update.    Patients contact #: [phone]     [id number] [place], [place] - [address]  Phone:   [phone] Fax:  [phone]

## 2022-07-23 DIAGNOSIS — M79609 Pain in unspecified limb: Secondary | ICD-10-CM | POA: Diagnosis not present

## 2022-07-23 DIAGNOSIS — Z0000 Encounter for general adult medical examination without abnormal findings: Secondary | ICD-10-CM | POA: Diagnosis not present

## 2022-07-23 DIAGNOSIS — R202 Paresthesia of skin: Secondary | ICD-10-CM | POA: Diagnosis not present

## 2022-07-24 LAB — COMPREHENSIVE METABOLIC PANEL
ALT: 19 IU/L (ref 0–44)
AST: 16 IU/L (ref 0–40)
Albumin: 4.4 g/dL (ref 3.8–4.9)
Alkaline Phosphatase: 98 IU/L (ref 44–121)
BUN/Creatinine Ratio: 11 (ref 9–20)
BUN: 18 mg/dL (ref 6–24)
Bilirubin Total: 0.3 mg/dL (ref 0.0–1.2)
CO2: 23 mmol/L (ref 20–29)
Calcium: 9.5 mg/dL (ref 8.7–10.2)
Chloride: 97 mmol/L (ref 96–106)
Creatinine, Ser: 1.58 mg/dL — ABNORMAL HIGH (ref 0.76–1.27)
Globulin, Total: 2.7 g/dL (ref 1.5–4.5)
Glucose: 115 mg/dL — ABNORMAL HIGH (ref 70–99)
Potassium: 4.3 mmol/L (ref 3.5–5.2)
Sodium: 135 mmol/L (ref 134–144)
Total Protein: 7.1 g/dL (ref 6.0–8.5)
eGFR: 51 mL/min/{1.73_m2} — ABNORMAL LOW (ref >59)

## 2022-07-24 LAB — LIPID PANEL
Chol/HDL Ratio: 4.6 ratio (ref 0.0–5.0)
Cholesterol, Total: 214 mg/dL — ABNORMAL HIGH (ref 100–199)
HDL: 47 mg/dL (ref >39)
LDL Chol Calc (NIH): 125 mg/dL — ABNORMAL HIGH (ref 0–99)
Triglycerides: 240 mg/dL — ABNORMAL HIGH (ref 0–149)
VLDL Cholesterol Cal: 42 mg/dL — ABNORMAL HIGH (ref 5–40)

## 2022-07-24 LAB — TSH RFX ON ABNORMAL TO FREE T4: TSH: 0.936 u[IU]/mL (ref 0.450–4.500)

## 2022-07-24 LAB — HCV INTERPRETATION

## 2022-07-24 LAB — HCV AB W REFLEX TO QUANT PCR: HCV Ab: NONREACTIVE

## 2022-07-24 LAB — HIV ANTIBODY (ROUTINE TESTING W REFLEX): HIV Screen 4th Generation wRfx: NONREACTIVE

## 2022-07-24 LAB — VITAMIN B12: Vitamin B-12: 539 pg/mL (ref 232–1245)

## 2022-07-28 ENCOUNTER — Other Ambulatory Visit: Payer: Self-pay | Admitting: Family Medicine

## 2022-07-28 ENCOUNTER — Ambulatory Visit
Admission: RE | Admit: 2022-07-28 | Discharge: 2022-07-28 | Discharge status: Home / Self Care | Payer: Medicaid - Out of State | Source: Ambulatory Visit | Attending: Family Medicine | Admitting: Family Medicine

## 2022-07-28 ENCOUNTER — Ambulatory Visit
Admission: RE | Admit: 2022-07-28 | Discharge: 2022-07-28 | Disposition: A | Discharge status: Home / Self Care | Payer: Medicaid - Out of State | Source: Ambulatory Visit | Attending: Family Medicine | Admitting: Family Medicine

## 2022-07-28 DIAGNOSIS — N62 Hypertrophy of breast: Secondary | ICD-10-CM | POA: Insufficient documentation

## 2022-07-28 DIAGNOSIS — J4530 Mild persistent asthma, uncomplicated: Secondary | ICD-10-CM

## 2022-08-02 ENCOUNTER — Encounter: Payer: Self-pay | Admitting: Family Medicine

## 2022-08-02 ENCOUNTER — Encounter: Payer: Self-pay | Admitting: *Deleted

## 2022-08-02 DIAGNOSIS — R7303 Prediabetes: Secondary | ICD-10-CM | POA: Insufficient documentation

## 2022-08-04 ENCOUNTER — Ambulatory Visit (INDEPENDENT_AMBULATORY_CARE_PROVIDER_SITE_OTHER): Payer: BLUE CROSS/BLUE SHIELD | Admitting: Family Medicine

## 2022-08-04 ENCOUNTER — Encounter: Payer: Self-pay | Admitting: Family Medicine

## 2022-08-04 ENCOUNTER — Other Ambulatory Visit: Payer: Self-pay | Admitting: Family Medicine

## 2022-08-04 VITALS — BP 136/80 | HR 84 | Temp 97.7°F | Resp 12 | Ht 73.5 in | Wt 189.6 lb

## 2022-08-04 DIAGNOSIS — F17200 Nicotine dependence, unspecified, uncomplicated: Secondary | ICD-10-CM | POA: Diagnosis not present

## 2022-08-04 DIAGNOSIS — Z122 Encounter for screening for malignant neoplasm of respiratory organs: Secondary | ICD-10-CM | POA: Diagnosis not present

## 2022-08-04 DIAGNOSIS — N62 Hypertrophy of breast: Secondary | ICD-10-CM | POA: Diagnosis not present

## 2022-08-04 DIAGNOSIS — Z8719 Personal history of other diseases of the digestive system: Secondary | ICD-10-CM

## 2022-08-04 DIAGNOSIS — R234 Changes in skin texture: Secondary | ICD-10-CM

## 2022-08-04 DIAGNOSIS — F321 Major depressive disorder, single episode, moderate: Secondary | ICD-10-CM

## 2022-08-04 DIAGNOSIS — I10 Essential (primary) hypertension: Secondary | ICD-10-CM

## 2022-08-04 DIAGNOSIS — J4530 Mild persistent asthma, uncomplicated: Secondary | ICD-10-CM | POA: Diagnosis not present

## 2022-08-04 DIAGNOSIS — M5442 Lumbago with sciatica, left side: Secondary | ICD-10-CM

## 2022-08-04 DIAGNOSIS — G8929 Other chronic pain: Secondary | ICD-10-CM

## 2022-08-04 MED ORDER — CELECOXIB 100 MG PO CAPS
100.0000 mg | ORAL_CAPSULE | Freq: Two times a day (BID) | ORAL | 2 refills | Status: DC
Start: 2022-08-04 — End: 2023-03-26
  Filled 2022-12-20: qty 60, 30d supply, fill #0
  Filled 2023-01-18: qty 60, 30d supply, fill #1

## 2022-08-04 MED ORDER — AIRSUPRA 90-80 MCG/ACT IN AERO
2.0000 | INHALATION_SPRAY | RESPIRATORY_TRACT | 1 refills | Status: DC | PRN
Start: 2022-08-04 — End: 2022-11-04

## 2022-08-04 MED ORDER — SPACER/AERO-HOLDING CHAMBERS DEVI
1.0000 | 1 refills | Status: DC
Start: 2022-08-04 — End: 2022-08-17

## 2022-08-04 NOTE — Progress Notes (Signed)
Established patient visit   Patient: Larry Maxwell   DOB: [birth year]   57 y.o. Male  MRN: [id number] Visit Date: 08/04/2022  Today's healthcare provider: Sherlyn Hay, DO   Chief Complaint  Patient presents with   Medical Management of Chronic Issues   Subjective    HPI HPI   2 week fu. Patient reports good tolerance and compliance with medications.  PHQ-9 15 Last edited by Myles Lipps, CMA on 08/04/2022  9:17 AM.     BP -  Started his medication with him systolic but pressures in the 782N per patient recollection, then trended down with more days of medication. Has not started valsartan yet.  08/18/22 - appointment with psych [facility]). Unsure if can go to [place]. Drives to appointments but doesn't think can go there due to gas needed to go Call to see if can be telehealth  He notes that the back of his left leg has been hurting.  He does note that this is in association with his low back pain and that it shoots down to that region of his leg. - He is concerned because he has varicose veins there and is worried they are related  connected to sciatica Varicose veins there PT at a [facility] in 2021 or 2022  Mom was somewhere between 38-40 when diagnosed with breast cancer Dad died of lung cancer   PPSV23 - received 10/25/2018   Medications: Outpatient Medications Prior to Visit  Medication Sig   albuterol (VENTOLIN HFA) 108 (90 Base) MCG/ACT inhaler Inhale 1 puff into the lungs every 6 (six) hours as needed for wheezing or shortness of breath.   amLODipine (NORVASC) 10 MG tablet Take 1 tablet (10 mg total) by mouth daily for 30 doses.   budesonide-formoterol (SYMBICORT) 80-4.5 MCG/ACT inhaler Inhale 2 puffs into the lungs 2 (two) times daily.   carvedilol (COREG) 12.5 MG tablet Take 1 tablet (12.5 mg total) by mouth 2 (two) times daily.   cetirizine (ZYRTEC) 10 MG tablet Take 1 tablet (10 mg total) by mouth daily.    chlorthalidone (HYGROTON) 25 MG tablet Take 1 tablet (25 mg total) by mouth daily.   escitalopram (LEXAPRO) 10 MG tablet Take 1 tablet (10 mg total) by mouth daily.   fluticasone (FLONASE) 50 MCG/ACT nasal spray Place 1 spray into both nostrils daily.   methocarbamol (ROBAXIN) 500 MG tablet Take 500 mg by mouth 4 (four) times daily.   pregabalin (LYRICA) 100 MG capsule Take 1 capsule (100 mg total) by mouth 2 (two) times daily.   rosuvastatin (CRESTOR) 10 MG tablet Take 1 tablet (10 mg total) by mouth daily.   valsartan (DIOVAN) 40 MG tablet Take 1 tablet (40 mg total) by mouth daily. Start taking after one week, if top BP number is >125   No facility-administered medications prior to visit.    Review of Systems  Constitutional:  Negative for appetite change, chills and fever.  Respiratory:  Negative for chest tightness, shortness of breath and wheezing.   Cardiovascular:  Negative for chest pain and palpitations.  Gastrointestinal:  Negative for abdominal pain, nausea and vomiting.  Skin:  Negative for color change and rash.  Psychiatric/Behavioral:  The patient is nervous/anxious.        Objective    BP 136/80 (BP Location: Left Arm, Cuff Size: Normal)   Pulse 84   Temp 97.7 F (36.5 C) (Temporal)   Resp 12   Ht 6' 1.5" (1.867  m)   Wt 189 lb 9.6 oz (86 kg)   SpO2 96%   BMI 24.68 kg/m    Physical Exam Vitals and nursing note reviewed.  Constitutional:      General: He is not in acute distress.    Appearance: Normal appearance.  HENT:     Head: Normocephalic and atraumatic.  Eyes:     General: No scleral icterus.    Conjunctiva/sclera: Conjunctivae normal.  Cardiovascular:     Rate and Rhythm: Normal rate.  Pulmonary:     Effort: Pulmonary effort is normal.  Skin:    General: Skin is warm and moist.     Coloration: Skin is not ashen or jaundiced.     Findings: No rash.          Comments: Varicose veins in locations noted Small 0.5 cm raised black plaque with  small open center (scab per pt; has been scratching at it)  Neurological:     Mental Status: He is alert and oriented to person, place, and time. Mental status is at baseline.  Psychiatric:        Mood and Affect: Mood normal.        Behavior: Behavior normal.      Results for orders placed or performed in visit on 08/04/22  Testosterone,Free and Total  Result Value Ref Range   Testosterone 418 264 - 916 ng/dL   Testosterone, Free WILL FOLLOW   Luteinizing hormone  Result Value Ref Range   LH 4.7 1.7 - 8.6 mIU/mL  Estradiol  Result Value Ref Range   Estradiol 22.2 7.6 - 42.6 pg/mL  hCG, serum, qualitative  Result Value Ref Range   hCG,Beta Subunit,Qual,Serum Negative mIU/mL    Assessment & Plan    1. Gynecomastia, male Discussed patient's reassuring mammogram result with him.  Also discussed that his spironolactone use is the most likely cause of his gynecomastia.  Discussed that the gynecomastia typically resolves after stopping spironolactone and that it should improve over time.  For thoroughness, we will go ahead and check hormone levels as noted below, to ensure this is not an alternative source of his gynecomastia. - Testosterone,Free and Total - Luteinizing hormone - Estradiol - hCG, serum, qualitative  2. Asthma, allergic, mild persistent, uncomplicated Patient has mild persistent asthma and was not able to get the Symbicort inhaler prescribed on his last visit.  Will prescribe air supra as rescue inhaler instead of albuterol as noted below.  Gave patient pharmacy coupon and a sample. - Albuterol-Budesonide (AIRSUPRA) 90-80 MCG/ACT AERO; Inhale 2 puffs into the lungs every 4 (four) hours as needed (shortness of breath/wheezing/coughing).  Dispense: 32.1 g; Refill: 1 - Spacer/Aero-Holding Chambers DEVI; 1 each by Does not apply route as directed. For use with inhalers  Dispense: 1 each; Refill: 1  3. Nicotine dependence with current use - Low Dose CT Chest w/o Contrast  for Lung Cancer Screening [[id number]]; Future  4. Screening for lung cancer Will screen patient for lung cancer given his longstanding use of inhaled tobacco.  5. Chronic midline low back pain with left-sided sciatica Refilled patient's celecoxib as noted below.  Of note, patient is seeking an evaluation for disability regarding his back.  Discussed that, given the relatively new patient-provider relationship, I am unable to provide this.  Advised that Social Security is able to provide appointments for disability evaluation for purposes of his paperwork.  Also encouraged him to contact both the Social Security office and his lawyer to figure out and get  the paperwork he was theoretically supposed to have received already. - celecoxib (CELEBREX) 100 MG capsule; Take 1 capsule (100 mg total) by mouth 2 (two) times daily.  Dispense: 60 capsule; Refill: 2  6. Primary hypertension Advised patient to go ahead and start his valsartan.  Will plan to have him follow-up in 2 weeks to recheck his pressure at that time.  7. Scab Patient has a small scab to the right side of his forehead.  Advised him to monitor it to ensure it heals properly and that it does not seem to be growing in size or becoming irregular in shape.  8.  History of diverticulosis Noted in old record patient showed on his phone.  No acute concerns.    Return in about 2 weeks (around 08/18/2022) for HTN, Anx/Dep.      The entirety of the information documented in the History of Present Illness, Review of Systems and Physical Exam were personally obtained by me. Portions of this information were initially documented by the CMA, Hyacinth Meeker, and reviewed by me for thoroughness and accuracy.   I discussed the assessment and treatment plan with the patient  The patient was provided an opportunity to ask questions and all were answered. The patient agreed with the plan and demonstrated an understanding of the instructions.   The  patient was advised to call back or seek an in-person evaluation if the symptoms worsen or if the condition fails to improve as anticipated.  Total time was 75 minutes. That includes chart review before the visit, the actual patient visit, and time spent on documentation after the visit.    Sherlyn Hay, DO  [facility] [phone] (phone) [phone] (fax)  [facility]

## 2022-08-04 NOTE — Patient Instructions (Addendum)
Contact your insurance to see if they'll cover the Shingrix (shingles) and (if you haven't had it) Hepatitis B vaccines.  Contact Social Security regarding the paperwork you were supposed to receive; also ask if they can arrange a consultative evaluation for disability.

## 2022-08-06 LAB — LUTEINIZING HORMONE: LH: 4.7 m[IU]/mL (ref 1.7–8.6)

## 2022-08-06 LAB — ESTRADIOL: Estradiol: 22.2 pg/mL (ref 7.6–42.6)

## 2022-08-06 LAB — HCG, SERUM, QUALITATIVE: hCG,Beta Subunit,Qual,Serum: NEGATIVE m[IU]/mL

## 2022-08-06 LAB — TESTOSTERONE,FREE AND TOTAL
Testosterone, Free: 4.7 pg/mL — ABNORMAL LOW (ref 7.2–24.0)
Testosterone: 418 ng/dL (ref 264–916)

## 2022-08-09 DIAGNOSIS — Z8719 Personal history of other diseases of the digestive system: Secondary | ICD-10-CM | POA: Insufficient documentation

## 2022-08-10 ENCOUNTER — Ambulatory Visit: Payer: PRIVATE HEALTH INSURANCE | Admitting: Family Medicine

## 2022-08-13 ENCOUNTER — Other Ambulatory Visit: Payer: Self-pay | Admitting: Family Medicine

## 2022-08-13 DIAGNOSIS — I10 Essential (primary) hypertension: Secondary | ICD-10-CM

## 2022-08-17 ENCOUNTER — Encounter: Payer: Self-pay | Admitting: Family Medicine

## 2022-08-17 ENCOUNTER — Ambulatory Visit (INDEPENDENT_AMBULATORY_CARE_PROVIDER_SITE_OTHER): Payer: 59 | Admitting: Family Medicine

## 2022-08-17 VITALS — BP 135/98 | HR 61 | Ht 73.0 in | Wt 186.0 lb

## 2022-08-17 DIAGNOSIS — G8929 Other chronic pain: Secondary | ICD-10-CM

## 2022-08-17 DIAGNOSIS — J4530 Mild persistent asthma, uncomplicated: Secondary | ICD-10-CM

## 2022-08-17 DIAGNOSIS — R202 Paresthesia of skin: Secondary | ICD-10-CM | POA: Diagnosis not present

## 2022-08-17 DIAGNOSIS — I10 Essential (primary) hypertension: Secondary | ICD-10-CM

## 2022-08-17 DIAGNOSIS — M5442 Lumbago with sciatica, left side: Secondary | ICD-10-CM | POA: Diagnosis not present

## 2022-08-17 DIAGNOSIS — F321 Major depressive disorder, single episode, moderate: Secondary | ICD-10-CM

## 2022-08-17 MED ORDER — GABAPENTIN 100 MG PO CAPS
100.0000 mg | ORAL_CAPSULE | Freq: Three times a day (TID) | ORAL | 3 refills | Status: DC
Start: 2022-08-17 — End: 2022-08-25

## 2022-08-17 NOTE — Assessment & Plan Note (Addendum)
Will start short-term treatment with gabapentin until pregabalin can be obtained as this is what patient has historically used with success; unclear if covered by insurance as system suggests it is not.

## 2022-08-17 NOTE — Patient Instructions (Addendum)
Contact [facility] this week about scheduling your visit.  This is time sensitive!  Follow-up with the GI doctor for your colonoscopy ([facility])   Contact [facility] to confirm insurance and reschedule.

## 2022-08-17 NOTE — Assessment & Plan Note (Addendum)
On amlodipine 10mg  daily, coreg 12.5mg  bid, chlorthalidone 25mg , and valsartan 40mg  daily. Patient's blood pressure does remain elevated today.  However, his significant discomfort in his arm may be the source of his elevated blood pressure,so I will not be making an adjustment to his regimen today and will reevaluate on the next visit after addressing this pain.

## 2022-08-17 NOTE — Progress Notes (Signed)
Established patient visit   Patient: Larry Maxwell   DOB: 10-17-1965   57 y.o. Male  MRN: [id number] Visit Date: 08/17/2022  Today's healthcare provider: Sherlyn Hay, DO   Chief Complaint  Patient presents with   Medical Management of Chronic Issues    Patient is present for 2 week f/u. Patient reports he last checked BP Saturday. He reports pain in arm since having labs drawn. Reports it is a aching pain and states it hurts bad like a tooth ache pain. Patient reports he has not taken anything for pain.   Subjective    Hypertension This is a chronic problem. Pertinent negatives include no chest pain, palpitations or shortness of breath. Past treatments include beta blockers, calcium channel blockers, angiotensin blockers and diuretics. The current treatment provides moderate improvement. Hypertensive end-organ damage includes kidney disease.   HPI     Medical Management of Chronic Issues    Additional comments: Patient is present for 2 week f/u. Patient reports he last checked BP Saturday. He reports pain in arm since having labs drawn. Reports it is a aching pain and states it hurts bad like a tooth ache pain. Patient reports he has not taken anything for pain.      Last edited by Acey Lav, CMA on 08/17/2022 10:45 AM.      Larey Seat last week down hill in front of sister's house (slip and fell because of dew on grass). Right forearm pain since blood draw 08/04/2022 - applied Bengay cream and wrapped it in an ACE bandage (careful to avoid it being too tight or too loose).  - cream did not help  - elevates it at night  - pain extends down anterior forearm and around to dorsal hand (no pain on ventral hand)  - tries to avoid tylenol, ibuprofen and aleve due to them causing stomach discomfort even when taken with food. Throbbing pain, pins and needles type sensation Soaked his arm in epsom salt in hot water, which helped it while it was in the  water.    Medications: Outpatient Medications Prior to Visit  Medication Sig   Albuterol-Budesonide (AIRSUPRA) 90-80 MCG/ACT AERO Inhale 2 puffs into the lungs every 4 (four) hours as needed (shortness of breath/wheezing/coughing).   amLODipine (NORVASC) 10 MG tablet TAKE 1 TABLET (10 MG TOTAL) BY MOUTH DAILY FOR 30 DOSES   carvedilol (COREG) 12.5 MG tablet TAKE 1 TABLET BY MOUTH 2 TIMES DAILY.   celecoxib (CELEBREX) 100 MG capsule Take 1 capsule (100 mg total) by mouth 2 (two) times daily.   cetirizine (ZYRTEC) 10 MG tablet Take 1 tablet (10 mg total) by mouth daily.   chlorthalidone (HYGROTON) 25 MG tablet TAKE 1 TABLET (25 MG TOTAL) BY MOUTH DAILY.   escitalopram (LEXAPRO) 10 MG tablet TAKE 1 TABLET BY MOUTH EVERY DAY   fluticasone (FLONASE) 50 MCG/ACT nasal spray Place 1 spray into both nostrils daily.   methocarbamol (ROBAXIN) 500 MG tablet Take 500 mg by mouth 4 (four) times daily.   pregabalin (LYRICA) 100 MG capsule Take 1 capsule (100 mg total) by mouth 2 (two) times daily.   rosuvastatin (CRESTOR) 10 MG tablet Take 1 tablet (10 mg total) by mouth daily.   valsartan (DIOVAN) 40 MG tablet TAKE 1 TABLET (40 MG TOTAL) BY MOUTH DAILY. START TAKING AFTER ONE WEEK, IF TOP BP NUMBER IS >125   albuterol (VENTOLIN HFA) 108 (90 Base) MCG/ACT inhaler Inhale 1 puff into the lungs every 6 (six)  hours as needed for wheezing or shortness of breath.   [DISCONTINUED] budesonide-formoterol (SYMBICORT) 80-4.5 MCG/ACT inhaler Inhale 2 puffs into the lungs 2 (two) times daily. (Patient not taking: Reported on 08/17/2022)   [DISCONTINUED] Spacer/Aero-Holding Deretha Emory DEVI 1 each by Does not apply route as directed. For use with inhalers (Patient not taking: Reported on 08/17/2022)   No facility-administered medications prior to visit.    Review of Systems  Constitutional:  Negative for appetite change, chills and fever.  Respiratory:  Negative for chest tightness, shortness of breath and wheezing.    Cardiovascular:  Negative for chest pain and palpitations.  Gastrointestinal:  Negative for abdominal pain, nausea and vomiting.  Skin:  Negative for color change, pallor, rash and wound.  Neurological:  Positive for numbness. Negative for weakness.         Objective    BP (!) 135/98 (BP Location: Right Arm, Patient Position: Sitting, Cuff Size: Normal)   Pulse 61   Ht 6\' 1"  (1.854 m)   Wt 186 lb (84.4 kg)   SpO2 100%   BMI 24.54 kg/m      Physical Exam Vitals and nursing note reviewed.  Constitutional:      General: He is not in acute distress.    Appearance: Normal appearance.  HENT:     Head: Normocephalic and atraumatic.  Eyes:     General: No scleral icterus.    Conjunctiva/sclera: Conjunctivae normal.  Cardiovascular:     Rate and Rhythm: Normal rate.  Pulmonary:     Effort: Pulmonary effort is normal.  Musculoskeletal:       Arms:     Comments: Altered sensation in areas noted Blue denotes general vicinity of blood draw access point  Neurological:     Mental Status: He is alert and oriented to person, place, and time. Mental status is at baseline.     Sensory: Sensory deficit (altered sensation in ventral aspect of forearm, down to dorsal hand) present.  Psychiatric:        Mood and Affect: Mood normal.        Behavior: Behavior normal.      No results found for any visits on 08/17/22.  Assessment & Plan    Primary hypertension Assessment & Plan: On amlodipine 10mg  daily, coreg 12.5mg  bid, chlorthalidone 25mg , and valsartan 40mg  daily. Patient's blood pressure does remain elevated today.  However, his significant discomfort in his arm may be the source of his elevated blood pressure,so I will not be making an adjustment to his regimen today and will reevaluate on the next visit after addressing this pain.     Paresthesia of right upper extremity Assessment & Plan: Will start short-term treatment with gabapentin until pregabalin can be obtained  as this is what patient has historically used with success; unclear if covered by insurance as system suggests it is not.  Orders: -     Gabapentin; Take 1 capsule (100 mg total) by mouth 3 (three) times daily.  Dispense: 90 capsule; Refill: 3  Asthma, allergic, mild persistent, uncomplicated Assessment & Plan: Patient's insurance did not cover the [place].  It also did not cover the Symbicort and/or had too high a co-pay.  Patient is currently using the years to provide samples given to him at the previous visit and reports good control.  Still awaiting patient's PFT at this time.  Patient does have albuterol prescription available at his pharmacy if needed, which she states he is aware of and will get prior to running  out of the [place] sample.   Chronic midline low back pain with left-sided sciatica Assessment & Plan: Orthopedics was not able to contact the patient.  Advised him (and included on AVS) that he needed to contact them after leaving the office today to schedule his appointment.  He expressed understanding.   Depression, major, single episode, moderate (HCC) Assessment & Plan: Patient was supposed to have an appointment with [facility], but they contacted him yesterday and told him that his insurance was not valid.  I encouraged him to call the office back and confirm that they have the correct information on file and to reschedule.     Return in about 2 months (around 10/25/2022) for HTN, Anx/Dep, paresthesia.      I discussed the assessment and treatment plan with the patient  The patient was provided an opportunity to ask questions and all were answered. The patient agreed with the plan and demonstrated an understanding of the instructions.   The patient was advised to call back or seek an in-person evaluation if the symptoms worsen or if the condition fails to improve as anticipated.    Sherlyn Hay, DO  [facility] [phone] (phone) [phone] (fax)  [facility]

## 2022-08-18 ENCOUNTER — Ambulatory Visit: Payer: PRIVATE HEALTH INSURANCE | Admitting: Family Medicine

## 2022-08-20 ENCOUNTER — Telehealth: Payer: Self-pay

## 2022-08-20 NOTE — Telephone Encounter (Signed)
PA for Lyrica has been denied

## 2022-08-24 ENCOUNTER — Ambulatory Visit: Payer: Medicaid - Out of State | Attending: Family Medicine

## 2022-08-24 DIAGNOSIS — R0609 Other forms of dyspnea: Secondary | ICD-10-CM | POA: Insufficient documentation

## 2022-08-24 DIAGNOSIS — Z87891 Personal history of nicotine dependence: Secondary | ICD-10-CM | POA: Insufficient documentation

## 2022-08-24 DIAGNOSIS — J4530 Mild persistent asthma, uncomplicated: Secondary | ICD-10-CM | POA: Diagnosis present

## 2022-08-24 MED ORDER — ALBUTEROL SULFATE (2.5 MG/3ML) 0.083% IN NEBU
2.5000 mg | INHALATION_SOLUTION | Freq: Once | RESPIRATORY_TRACT | Status: AC
  Administered 2022-08-24: 2.5 mg via RESPIRATORY_TRACT
  Filled 2022-08-24: qty 3

## 2022-08-25 ENCOUNTER — Ambulatory Visit (INDEPENDENT_AMBULATORY_CARE_PROVIDER_SITE_OTHER): Payer: BLUE CROSS/BLUE SHIELD | Admitting: Family Medicine

## 2022-08-25 ENCOUNTER — Encounter: Payer: Self-pay | Admitting: Family Medicine

## 2022-08-25 VITALS — BP 129/97 | HR 65 | Temp 98.6°F | Ht 73.0 in | Wt 187.0 lb

## 2022-08-25 DIAGNOSIS — F321 Major depressive disorder, single episode, moderate: Secondary | ICD-10-CM

## 2022-08-25 DIAGNOSIS — M5442 Lumbago with sciatica, left side: Secondary | ICD-10-CM | POA: Diagnosis not present

## 2022-08-25 DIAGNOSIS — R202 Paresthesia of skin: Secondary | ICD-10-CM | POA: Diagnosis not present

## 2022-08-25 DIAGNOSIS — J984 Other disorders of lung: Secondary | ICD-10-CM

## 2022-08-25 DIAGNOSIS — I10 Essential (primary) hypertension: Secondary | ICD-10-CM

## 2022-08-25 DIAGNOSIS — G8929 Other chronic pain: Secondary | ICD-10-CM

## 2022-08-25 MED ORDER — ESCITALOPRAM OXALATE 20 MG PO TABS
20.0000 mg | ORAL_TABLET | Freq: Every day | ORAL | 3 refills | Status: DC
Start: 2022-08-25 — End: 2022-11-04

## 2022-08-25 MED ORDER — GABAPENTIN 300 MG PO CAPS
ORAL_CAPSULE | ORAL | 0 refills | Status: DC
Start: 2022-08-25 — End: 2022-11-24

## 2022-08-25 NOTE — Progress Notes (Signed)
Established patient visit   Patient: Larry Maxwell   DOB: 05/06/1965   57 y.o. Male  MRN: [id number] Visit Date: 08/25/2022  Today's healthcare provider: Sherlyn Hay, DO   Chief Complaint  Patient presents with   Hypertension    Patient states he is feeling well other than being under a lot of stress.  He reports he does not check his blood pressure outside of our office.   While here he would like to get the results of his pulmonary testing that was done yesterday   Subjective    HPI HPI     Hypertension    Additional comments: Patient states he is feeling well other than being under a lot of stress.  He reports he does not check his blood pressure outside of our office.   While here he would like to get the results of his pulmonary testing that was done yesterday      Last edited by Adline Peals, CMA on 08/25/2022  3:21 PM.      Patient presents today for follow-up regarding his blood pressure.  It is significantly improved today. His numbness and tingling in his right upper extremity has also improved somewhat but continues to be uncomfortable.  He continues to treated by putting it in warm water.  Patient also inquired about the results of his pulmonary function test he had yesterday.  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 Worked there for two years, then worked at [employer] for 12 years.  He felt there was a lot of dust there as well, and that it was worse than his job in [employer].   He has his appointment scheduled at [facility] 09/02/2022 for his back and leg; he notes that the pain is very agrravating.  Depression/anxiety: His therapy appointment had to be rescheduled to late August, as the office originally canceled the appointment having possessed the wrong insurance information and thinking his insurance did not cover it.  He still  has a sample of the [place], which has relieved/improved his shortness of breath and cough, when needed.  The albuterol inhaler is still available at the pharmacy for him to pick up.     Medications: Outpatient Medications Prior to Visit  Medication Sig   albuterol (VENTOLIN HFA) 108 (90 Base) MCG/ACT inhaler Inhale 1 puff into the lungs every 6 (six) hours as needed for wheezing or shortness of breath.   Albuterol-Budesonide (AIRSUPRA) 90-80 MCG/ACT AERO Inhale 2 puffs into the lungs every 4 (four) hours as needed (shortness of breath/wheezing/coughing).   amLODipine (NORVASC) 10 MG tablet TAKE 1 TABLET (10 MG TOTAL) BY MOUTH DAILY FOR 30 DOSES   carvedilol (COREG) 12.5 MG tablet TAKE 1 TABLET BY MOUTH 2 TIMES DAILY.   celecoxib (CELEBREX) 100 MG capsule Take 1 capsule (100 mg total) by mouth 2 (two) times daily.   cetirizine (ZYRTEC) 10 MG tablet Take 1 tablet (10 mg total) by mouth daily.   chlorthalidone (HYGROTON) 25 MG tablet TAKE 1 TABLET (25 MG TOTAL) BY MOUTH DAILY.   fluticasone (FLONASE) 50 MCG/ACT nasal spray Place 1 spray into both nostrils daily.   methocarbamol (ROBAXIN) 500 MG tablet Take 500 mg by mouth 4 (four) times daily.   rosuvastatin (CRESTOR) 10 MG tablet Take 1 tablet (10 mg total) by mouth daily.  valsartan (DIOVAN) 40 MG tablet TAKE 1 TABLET (40 MG TOTAL) BY MOUTH DAILY. START TAKING AFTER ONE WEEK, IF TOP BP NUMBER IS >125   [DISCONTINUED] escitalopram (LEXAPRO) 10 MG tablet TAKE 1 TABLET BY MOUTH EVERY DAY   [DISCONTINUED] gabapentin (NEURONTIN) 100 MG capsule Take 1 capsule (100 mg total) by mouth 3 (three) times daily.   [DISCONTINUED] pregabalin (LYRICA) 100 MG capsule Take 1 capsule (100 mg total) by mouth 2 (two) times daily.   No facility-administered medications prior to visit.    Review of Systems  Constitutional:  Negative for appetite change, chills and fever.  Respiratory:  Negative for chest tightness, shortness of breath and wheezing.    Cardiovascular:  Negative for chest pain, palpitations and leg swelling.  Gastrointestinal:  Negative for abdominal pain, nausea and vomiting.  Musculoskeletal:  Positive for arthralgias and back pain.  Neurological:  Positive for numbness. Negative for dizziness, light-headedness and headaches.  Psychiatric/Behavioral:  Positive for dysphoric mood. The patient is nervous/anxious.           Objective    BP (!) 129/97 (BP Location: Left Arm, Patient Position: Sitting, Cuff Size: Normal)   Pulse 65   Temp 98.6 F (37 C) (Oral)   Ht 6\' 1"  (1.854 m)   Wt 187 lb (84.8 kg)   SpO2 100%   BMI 24.67 kg/m      Physical Exam Vitals and nursing note reviewed.  Constitutional:      General: He is not in acute distress.    Appearance: Normal appearance.  HENT:     Head: Normocephalic and atraumatic.  Eyes:     General: No scleral icterus.    Conjunctiva/sclera: Conjunctivae normal.  Cardiovascular:     Rate and Rhythm: Normal rate.  Pulmonary:     Effort: Pulmonary effort is normal.  Neurological:     Mental Status: He is alert and oriented to person, place, and time. Mental status is at baseline.  Psychiatric:        Mood and Affect: Mood normal.        Behavior: Behavior normal.      No results found for any visits on 08/25/22.  Assessment & Plan    Restrictive lung disease Assessment & Plan: I discussed with patient that his pulmonary function testing showed restrictive lung disease.  I am referring him to pulmonology for additional workup and treatment recommendations.  Orders: -     Ambulatory referral to Pulmonology  Primary hypertension Assessment & Plan: Significantly improved today.  Will continue patient on his current dosing.  Encouraged patient to get a blood pressure cuff and check his blood pressures at home.  Advised him to bring his log and the blood pressure cuff with him at the next visit, so we can compare it against our cuff in the clinic. May  consider increasing his valsartan dose at the next visit if his blood pressure remains at the same level, particularly if his home blood pressures are consistent with this.   Paresthesia of right upper extremity Assessment & Plan: Patient is still having significant pain.  His pregabalin was declined by his insurance company.  Will increase his gabapentin as noted for both his right upper extremity paresthesia and his back pain with sciatica.  Orders: -     Gabapentin; Take 1 tab morning, 1 tab at midday, and 2 tabs before bedtime  Dispense: 120 capsule; Refill: 0  Chronic midline low back pain with left-sided sciatica Assessment & Plan: Addressed  as noted above.  Patient will be seeing orthopedics on 09/02/2022.  Orders: -     Gabapentin; Take 1 tab morning, 1 tab at midday, and 2 tabs before bedtime  Dispense: 120 capsule; Refill: 0  Depression, major, single episode, moderate (HCC) Assessment & Plan: Patient feels he is deriving benefit from the escitalopram but feels there is room for improvement.  Will go ahead and increase his escitalopram to 20 mg today.  Orders: -     Escitalopram Oxalate; Take 1 tablet (20 mg total) by mouth daily.  Dispense: 30 tablet; Refill: 3    Return in about 2 months (around 10/25/2022).      I discussed the assessment and treatment plan with the patient  The patient was provided an opportunity to ask questions and all were answered. The patient agreed with the plan and demonstrated an understanding of the instructions.   The patient was advised to call back or seek an in-person evaluation if the symptoms worsen or if the condition fails to improve as anticipated.    Sherlyn Hay, DO  [facility] [phone] (phone) [phone] (fax)  [facility]

## 2022-08-25 NOTE — Assessment & Plan Note (Signed)
Patient's insurance did not cover the [place].  It also did not cover the Symbicort and/or had too high a co-pay.  Patient is currently using the years to provide samples given to him at the previous visit and reports good control.  Still awaiting patient's PFT at this time.  Patient does have albuterol prescription available at his pharmacy if needed, which she states he is aware of and will get prior to running out of the [place] sample.

## 2022-08-25 NOTE — Patient Instructions (Signed)
   Gabapentin: Day 1: Start by taking 1 tablet at lunch and 1 tab before bed Day 2: Take 1 tab at breakfast, 1 tab at lunch and 1 tab before bed Day 3: start dosing on bottle (1 tab at breakfast, 1 tab at lunch and 2 tabs before bed)

## 2022-08-25 NOTE — Assessment & Plan Note (Signed)
Patient was supposed to have an appointment with [facility], but they contacted him yesterday and told him that his insurance was not valid.  I encouraged him to call the office back and confirm that they have the correct information on file and to reschedule.

## 2022-08-25 NOTE — Assessment & Plan Note (Signed)
Orthopedics was not able to contact the patient.  Advised him (and included on AVS) that he needed to contact them after leaving the office today to schedule his appointment.  He expressed understanding.

## 2022-08-27 NOTE — Assessment & Plan Note (Addendum)
Significantly improved today.  Will continue patient on his current dosing.  Encouraged patient to get a blood pressure cuff and check his blood pressures at home.  Advised him to bring his log and the blood pressure cuff with him at the next visit, so we can compare it against our cuff in the clinic. May consider increasing his valsartan dose at the next visit if his blood pressure remains at the same level, particularly if his home blood pressures are consistent with this.

## 2022-08-27 NOTE — Assessment & Plan Note (Signed)
Patient feels he is deriving benefit from the escitalopram but feels there is room for improvement.  Will go ahead and increase his escitalopram to 20 mg today.

## 2022-08-27 NOTE — Assessment & Plan Note (Addendum)
Addressed as noted above.  Patient will be seeing orthopedics on 09/02/2022.

## 2022-08-27 NOTE — Assessment & Plan Note (Signed)
Patient is still having significant pain.  His pregabalin was declined by his insurance company.  Will increase his gabapentin as noted for both his right upper extremity paresthesia and his back pain with sciatica.

## 2022-08-27 NOTE — Assessment & Plan Note (Signed)
I discussed with patient that his pulmonary function testing showed restrictive lung disease.  I am referring him to pulmonology for additional workup and treatment recommendations.

## 2022-08-31 ENCOUNTER — Encounter: Payer: Self-pay | Admitting: *Deleted

## 2022-09-01 ENCOUNTER — Encounter: Payer: Self-pay | Admitting: Pulmonary Disease

## 2022-09-01 ENCOUNTER — Ambulatory Visit (INDEPENDENT_AMBULATORY_CARE_PROVIDER_SITE_OTHER): Payer: 59 | Admitting: Pulmonary Disease

## 2022-09-01 ENCOUNTER — Other Ambulatory Visit
Admission: RE | Admit: 2022-09-01 | Discharge: 2022-09-01 | Disposition: A | Discharge status: Home / Self Care | Payer: 59 | Attending: Pulmonary Disease | Admitting: Pulmonary Disease

## 2022-09-01 VITALS — BP 122/82 | HR 65 | Temp 97.9°F | Ht 73.0 in | Wt 187.2 lb

## 2022-09-01 DIAGNOSIS — J984 Other disorders of lung: Secondary | ICD-10-CM

## 2022-09-01 LAB — CBC WITH DIFFERENTIAL/PLATELET
Abs Immature Granulocytes: 0.03 10*3/uL (ref 0.00–0.07)
Basophils Absolute: 0 10*3/uL (ref 0.0–0.1)
Basophils Relative: 1 %
Eosinophils Absolute: 0.2 10*3/uL (ref 0.0–0.5)
Eosinophils Relative: 3 %
HCT: 46.3 % (ref 39.0–52.0)
Hemoglobin: 16.5 g/dL (ref 13.0–17.0)
Immature Granulocytes: 0 %
Lymphocytes Relative: 32 %
Lymphs Abs: 2.3 10*3/uL (ref 0.7–4.0)
MCH: 33.7 pg (ref 26.0–34.0)
MCHC: 35.6 g/dL (ref 30.0–36.0)
MCV: 94.5 fL (ref 80.0–100.0)
Monocytes Absolute: 0.5 10*3/uL (ref 0.1–1.0)
Monocytes Relative: 7 %
Neutro Abs: 4.3 10*3/uL (ref 1.7–7.7)
Neutrophils Relative %: 57 %
Platelets: 206 10*3/uL (ref 150–400)
RBC: 4.9 MIL/uL (ref 4.22–5.81)
RDW: 11.8 % (ref 11.5–15.5)
WBC: 7.4 10*3/uL (ref 4.0–10.5)
nRBC: 0 % (ref 0.0–0.2)

## 2022-09-01 LAB — CK: Total CK: 205 U/L (ref 49–397)

## 2022-09-01 LAB — SEDIMENTATION RATE: Sed Rate: 4 mm/hr (ref 0–20)

## 2022-09-01 LAB — C-REACTIVE PROTEIN: CRP: 0.5 mg/dL (ref <1.0)

## 2022-09-01 MED ORDER — BREZTRI AEROSPHERE 160-9-4.8 MCG/ACT IN AERO: 2.0000 | INHALATION_SPRAY | Freq: Two times a day (BID) | RESPIRATORY_TRACT | Status: DC

## 2022-09-01 NOTE — Addendum Note (Signed)
Addended by: Lajoyce Lauber A on: 09/01/2022 09:39 AM   Modules accepted: Orders

## 2022-09-01 NOTE — Addendum Note (Signed)
Addended by: Lajoyce Lauber A on: 09/01/2022 11:01 AM   Modules accepted: Orders

## 2022-09-01 NOTE — Progress Notes (Addendum)
Synopsis: Referred in by Sherlyn Hay, DO   Subjective:   PATIENT ID: Larry Maxwell GENDER: male DOB: Oct 22, 1965, MRN: [id number]  Chief Complaint  Patient presents with   pulmonary consult    SOB with exertion, prod cough with yellow sputum and wheezing.     HPI Larry Maxwell is a 57 years old male patient with a past medical history of essential hypertension, HLD, MDD and sciatica presenting today to the pulmonary clinic after a pulmonary function test showing restrictive lung disease.   He is complaining from shortness of breath for about a year now. Worse on exertion and uphill. Also worse when laying flat. It is associated with a productive cough of clear sputum for also about year. He does report some chest pain when he coughs a lot. He denies any chills or nightsweats. Denies any dysphagia or acid reflux. Also denies any stiff joints or rashes but does have chronic back pain with sciatica. He had PFTs done by his primary care provider that revealed restrictive lung disease with reduced DLCO. He has a cxr from 2006 on file that shows reticular opacities at the bases bilaterally.    FH: His Father passed away of lung cancer. His mother had breastcancer.   SH: Exsmoker quit 1 year ago smoked about 1 PPD for 42 years. He denies alcohol use. Denies any illicit drug use but smokes marijuana occasionally. Currently unemployed, worked in a wearhouse for about 15 years. He has 1 cat at home. No birds or dogs. No travel outside the [place].   ROS All systems were reviewed and are negative except for the above.  Objective:   Vitals:   09/01/22 0829  BP: 122/82  Pulse: 65  Temp: 97.9 F (36.6 C)  TempSrc: Temporal  SpO2: 97%  Weight: 187 lb 3.2 oz (84.9 kg)  Height: 6\' 1"  (1.854 m)   97% on RA BMI Readings from Last 3 Encounters:  09/01/22 24.70 kg/m  08/25/22 24.67 kg/m  08/17/22 24.54 kg/m   Wt Readings from Last 3 Encounters:  09/01/22 187 lb 3.2 oz (84.9 kg)  08/25/22  187 lb (84.8 kg)  08/17/22 186 lb (84.4 kg)    Physical Exam GEN: NAD HEENT: Supple Neck, Reactive Pupils, EOMI  CVS: Normal S1, Normal S2, RRR, No murmurs or ES appreciated  Lungs: Decrease air entry at the bases with faint crackles. No wheezing appreciated.  Abdomen: Soft, non tender, non distended, + BS  Extremities: Warm and well perfused, No edema  Skin: No suspicious lesions appreciated  Psych: Normal Affect  Ancillary Information   CBC No results found for: "WBC", "RBC", "HGB", "HCT", "PLT", "MCV", "MCH", "MCHC", "RDW", "LYMPHSABS", "MONOABS", "EOSABS", "BASOSABS"  Imaging  CXR 2006: Chronic lung changes - no acute process.   PFTs 08/24/2022: FVC 2.3 L 43% of predicted FEV1 1.19 L 28% of predicted FEV1 to FVC ratio 51 with lower limit of normal 67.  Total lung capacity was reduced at 4.59 L 60% of predicted.  DLCO was reduced at 50% of predicted.     Latest Ref Rng & Units 08/24/2022    3:28 PM  PFT Results  FVC-Pre L 2.34   FVC-Predicted Pre % 43   FVC-Post L 2.90   FVC-Predicted Post % 53   Pre FEV1/FVC % % 51   Post FEV1/FCV % % 61   FEV1-Pre L 1.19   FEV1-Predicted Pre % 28   FEV1-Post L 1.78   DLCO uncorrected ml/min/mmHg 15.57   DLCO  [facility]% % 50   DLVA Predicted % 90   TLC L 4.59   TLC % Predicted % 60   RV % Predicted % 97      Assessment & Plan:  Larry Maxwell is a 57 years old male patient with a past medical history of essential hypertension, HLD, MDD, tobacco use in the past and sciatica presenting today to the pulmonary clinic after a pulmonary function test showing restrictive lung disease.   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 Possible UIP but does not fall in the correct age range, fibrotic NSIP is possible however no apparent risk factors. Furthermore CTD-ILD is possible however also no apparent systemic  symptoms.   []  High-resolution CT chest.  []  6 min walk test.  []  ANA, RF, CCP, CRP, ESR, CK, Myomarker panel, Sjogren Antibodies and Scleroderma Antibodies.  []  Start budesonide-glycopyrrolate-formoterol [Breztri] 160-9- 4.8 2 puffs twice a day. Advised to rinse mouth with water after each use.   #Tobacco use disorder Quit a year ago. Counseled on importance of continuing with abstinence from smoking.  Return in about 4 weeks (around 09/29/2022).  I spent 60 minutes caring for this patient today, including preparing to see the patient, obtaining a medical history , reviewing a separately obtained history, performing a medically appropriate examination and/or evaluation, counseling and educating the patient/family/caregiver, ordering medications, tests, or procedures, documenting clinical information in the electronic health record, and independently interpreting results (not separately reported/billed) and communicating results to the patient/family/caregiver  Janann Colonel, MD [facility] 09/01/2022 9:08 AM

## 2022-09-05 ENCOUNTER — Encounter: Payer: Self-pay | Admitting: Pulmonary Disease

## 2022-09-06 ENCOUNTER — Telehealth: Payer: Self-pay | Admitting: Pulmonary Disease

## 2022-09-06 NOTE — Telephone Encounter (Signed)
Patient has Larry Maxwell of [place] plan and [facility] is not in network. I have left a message asking the patient to call to get the Provider phone number from the back of the card. I have CXL the CT appt on 09/08/22 since we are not in network

## 2022-09-07 ENCOUNTER — Telehealth: Payer: Self-pay

## 2022-09-07 NOTE — Telephone Encounter (Signed)
Did you call this patient?

## 2022-09-07 NOTE — Telephone Encounter (Signed)
Patient returned call today.  When asked about his arm he said it still get numb and at times it hurts really bad.  He describes that at night he sleeps with a fan and if his arm gets cold he has to wrap it up and keep it warm or it will hurt bad.  I asked him about any trauma to that arm in the past that may have set him up for some arthritis but he said no.  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  He has an appointment in mid Sept.  Do you need to see him sooner.

## 2022-09-07 NOTE — Telephone Encounter (Signed)
We technically are not in network with his insurance either.  He is CXL the [employer] plan which will term 09/25/22. We have now rescheduled his CT for 09/28/22 @ 8:00am [facility] [employer]

## 2022-09-07 NOTE — Telephone Encounter (Signed)
Copied from CRM [id number]. Topic: General - Call Back - No Documentation >> Sep 07, 2022 12:32 PM Ja-Kwan M wrote: Reason for CRM: Pt stated he had a missed call from the office so he was returning the call. Pt stated he thinks the call may have been from Maralyn Sago

## 2022-09-08 ENCOUNTER — Ambulatory Visit: Payer: 59

## 2022-09-08 ENCOUNTER — Telehealth: Payer: Self-pay

## 2022-09-08 NOTE — Telephone Encounter (Signed)
Message left for patient to return my call.  

## 2022-09-08 NOTE — Telephone Encounter (Signed)
Pt received the message about scheduling his colonoscopy. Please call patient today or tomorrow between 9:00am - 11:00am.

## 2022-09-10 NOTE — Telephone Encounter (Signed)
Message left for patient to return my call.  

## 2022-09-13 ENCOUNTER — Encounter: Payer: Self-pay | Admitting: Pulmonary Disease

## 2022-09-14 ENCOUNTER — Encounter: Payer: Self-pay | Admitting: *Deleted

## 2022-09-15 ENCOUNTER — Encounter: Payer: Self-pay | Admitting: Pulmonary Disease

## 2022-09-15 ENCOUNTER — Telehealth: Payer: Self-pay

## 2022-09-15 NOTE — Telephone Encounter (Signed)
Copied from CRM [id number]. Topic: General - Other >> Sep 14, 2022  8:50 AM Franchot Heidelberg wrote: Reason for CRM:   Angie calling from [place] partners called to report that they have been unable to contact the patient. No answer when they call and voicemail box is full.   Best contact: [phone]

## 2022-09-16 NOTE — Telephone Encounter (Signed)
FYI

## 2022-09-28 ENCOUNTER — Ambulatory Visit
Admission: RE | Admit: 2022-09-28 | Discharge: 2022-09-28 | Disposition: A | Discharge status: Home / Self Care | Payer: 59 | Source: Ambulatory Visit | Attending: Pulmonary Disease | Admitting: Pulmonary Disease

## 2022-09-28 ENCOUNTER — Other Ambulatory Visit: Payer: Self-pay | Admitting: Family Medicine

## 2022-09-28 DIAGNOSIS — F321 Major depressive disorder, single episode, moderate: Secondary | ICD-10-CM

## 2022-09-28 DIAGNOSIS — I7121 Aneurysm of the ascending aorta, without rupture: Secondary | ICD-10-CM | POA: Diagnosis not present

## 2022-09-28 DIAGNOSIS — J984 Other disorders of lung: Secondary | ICD-10-CM

## 2022-09-28 DIAGNOSIS — I10 Essential (primary) hypertension: Secondary | ICD-10-CM

## 2022-09-28 DIAGNOSIS — R918 Other nonspecific abnormal finding of lung field: Secondary | ICD-10-CM | POA: Diagnosis not present

## 2022-09-28 DIAGNOSIS — J439 Emphysema, unspecified: Secondary | ICD-10-CM | POA: Diagnosis not present

## 2022-09-28 DIAGNOSIS — R0602 Shortness of breath: Secondary | ICD-10-CM | POA: Diagnosis not present

## 2022-09-28 DIAGNOSIS — J432 Centrilobular emphysema: Secondary | ICD-10-CM | POA: Diagnosis not present

## 2022-09-30 NOTE — Telephone Encounter (Signed)
Requested Prescriptions  Pending Prescriptions Disp Refills   carvedilol (COREG) 12.5 MG tablet [Pharmacy Med Name: CARVEDILOL 12.5 MG TABLET] 60 tablet 0    Sig: TAKE 1 TABLET BY MOUTH TWICE A DAY     Cardiovascular: Beta Blockers 3 Failed - 09/28/2022  4:18 PM      Failed - Cr in normal range and within 360 days    Creatinine, Ser  Date Value Ref Range Status  07/23/2022 1.58 (H) 0.76 - 1.27 mg/dL Final         Passed - AST in normal range and within 360 days    AST  Date Value Ref Range Status  07/23/2022 16 0 - 40 IU/L Final         Passed - ALT in normal range and within 360 days    ALT  Date Value Ref Range Status  07/23/2022 19 0 - 44 IU/L Final         Passed - Last BP in normal range    BP Readings from Last 1 Encounters:  09/01/22 122/82         Passed - Last Heart Rate in normal range    Pulse Readings from Last 1 Encounters:  09/01/22 65         Passed - Valid encounter within last 6 months    Recent Outpatient Visits           1 month ago Restrictive lung disease   [facility] [place], Monico Blitz, DO   1 month ago Primary hypertension   [place] [facility] [place], Monico Blitz, DO   1 month ago Gynecomastia, male   [facility] [place], Monico Blitz, DO   2 months ago Annual physical exam   [facility] [place], Monico Blitz, DO       Future Appointments             In 3 weeks [place], Monico Blitz, DO [place]             escitalopram (LEXAPRO) 10 MG tablet [Pharmacy Med Name: ESCITALOPRAM 10 MG TABLET] 30 tablet     Sig: TAKE 1 TABLET BY MOUTH EVERY DAY     Psychiatry:  Antidepressants - SSRI Passed - 09/28/2022  4:18 PM      Passed - Completed PHQ-2 or PHQ-9 in the last 360 days      Passed - Valid encounter within last 6 months    Recent Outpatient Visits           1 month ago Restrictive lung disease   [place] [facility] [place], Monico Blitz, DO   1 month ago Primary hypertension   [place] [facility] [place], Monico Blitz, DO   1 month ago Gynecomastia, male   [facility] [place], Monico Blitz, DO   2 months ago Annual physical exam   [facility] [place], Monico Blitz, DO       Future Appointments             In 3 weeks [place], Monico Blitz, DO [facility]             amLODipine (NORVASC) 10 MG tablet [Pharmacy Med Name: AMLODIPINE BESYLATE 10 MG TAB] 30 tablet     Sig: TAKE 1 TABLET (10 MG TOTAL) BY MOUTH DAILY FOR 30 DOSES  Cardiovascular: Calcium Channel Blockers 2 Passed - 09/28/2022  4:18 PM      Passed - Last BP in normal range    BP Readings from Last 1 Encounters:  09/01/22 122/82         Passed - Last Heart Rate in normal range    Pulse Readings from Last 1 Encounters:  09/01/22 65         Passed - Valid encounter within last 6 months    Recent Outpatient Visits           1 month ago Restrictive lung disease   [facility] [place], Monico Blitz, DO   1 month ago Primary hypertension   [place] [facility] [place], Monico Blitz, DO   1 month ago Gynecomastia, male   [facility] [place], Monico Blitz, DO   2 months ago Annual physical exam   [facility] [place], Monico Blitz, DO       Future Appointments             In 3 weeks [place], Monico Blitz, DO [facility], [facility]

## 2022-09-30 NOTE — Telephone Encounter (Signed)
Requested medication (s) are due for refill today: ?  Requested medication (s) are on the active medication list: present but end date was written for 09/04/22  Last refill:  08/05/22  Future visit scheduled: yes  Notes to clinic:  limited amount ordered- NT unable to reorder   Requested Prescriptions  Pending Prescriptions Disp Refills   amLODipine (NORVASC) 10 MG tablet [Pharmacy Med Name: AMLODIPINE BESYLATE 10 MG TAB] 30 tablet     Sig: TAKE 1 TABLET (10 MG TOTAL) BY MOUTH DAILY FOR 30 DOSES     Cardiovascular: Calcium Channel Blockers 2 Passed - 09/28/2022  4:18 PM      Passed - Last BP in normal range    BP Readings from Last 1 Encounters:  09/01/22 122/82         Passed - Last Heart Rate in normal range    Pulse Readings from Last 1 Encounters:  09/01/22 65         Passed - Valid encounter within last 6 months    Recent Outpatient Visits           1 month ago Restrictive lung disease   [place] [facility] [place], Monico Blitz, DO   1 month ago Primary hypertension   [place] [facility] [place], Monico Blitz, DO   1 month ago Gynecomastia, male   [facility] [place], Monico Blitz, DO   2 months ago Annual physical exam   [facility] [place], Monico Blitz, DO       Future Appointments             In 3 weeks [place], Monico Blitz, DO [place] [facility], [facility]            Signed Prescriptions Disp Refills   carvedilol (COREG) 12.5 MG tablet 60 tablet 0    Sig: TAKE 1 TABLET BY MOUTH TWICE A DAY     Cardiovascular: Beta Blockers 3 Failed - 09/28/2022  4:18 PM      Failed - Cr in normal range and within 360 days    Creatinine, Ser  Date Value Ref Range Status  07/23/2022 1.58 (H) 0.76 - 1.27 mg/dL Final         Passed - AST in normal range and within 360 days    AST  Date Value Ref Range Status  07/23/2022 16 0 - 40 IU/L Final         Passed - ALT in normal range and  within 360 days    ALT  Date Value Ref Range Status  07/23/2022 19 0 - 44 IU/L Final         Passed - Last BP in normal range    BP Readings from Last 1 Encounters:  09/01/22 122/82         Passed - Last Heart Rate in normal range    Pulse Readings from Last 1 Encounters:  09/01/22 65         Passed - Valid encounter within last 6 months    Recent Outpatient Visits           1 month ago Restrictive lung disease   [place] [facility], Monico Blitz, DO   1 month ago Primary hypertension   [facility] [place], Monico Blitz, DO   1 month ago Gynecomastia, male   [facility] [place], Monico Blitz, DO   2 months ago  Annual physical exam   [facility] [place], Monico Blitz, DO       Future Appointments             In 3 weeks [place], Monico Blitz, DO [place] [facility]            Refused Prescriptions Disp Refills   escitalopram (LEXAPRO) 10 MG tablet [Pharmacy Med Name: ESCITALOPRAM 10 MG TABLET] 30 tablet     Sig: TAKE 1 TABLET BY MOUTH EVERY DAY     Psychiatry:  Antidepressants - SSRI Passed - 09/28/2022  4:18 PM      Passed - Completed PHQ-2 or PHQ-9 in the last 360 days      Passed - Valid encounter within last 6 months    Recent Outpatient Visits           1 month ago Restrictive lung disease   [facility] [place], Monico Blitz, DO   1 month ago Primary hypertension   [facility] [place], Monico Blitz, DO   1 month ago Gynecomastia, male   [facility] [place], Monico Blitz, DO   2 months ago Annual physical exam   [facility] [place], Monico Blitz, DO       Future Appointments             In 3 weeks [place], Monico Blitz, DO [facility], [facility]

## 2022-10-01 NOTE — Telephone Encounter (Signed)
Patient change insurance to [facility] [place] approved Drucie Opitz # [id number] valid 09/28/22 to 03/28/2023

## 2022-10-14 ENCOUNTER — Ambulatory Visit (INDEPENDENT_AMBULATORY_CARE_PROVIDER_SITE_OTHER): Payer: 59 | Admitting: Pulmonary Disease

## 2022-10-14 ENCOUNTER — Encounter: Payer: Self-pay | Admitting: Pulmonary Disease

## 2022-10-14 VITALS — BP 106/68 | HR 78 | Temp 97.6°F | Ht 73.0 in | Wt 187.2 lb

## 2022-10-14 DIAGNOSIS — J441 Chronic obstructive pulmonary disease with (acute) exacerbation: Secondary | ICD-10-CM | POA: Diagnosis not present

## 2022-10-14 MED ORDER — TRELEGY ELLIPTA 100-62.5-25 MCG/ACT IN AEPB: 1.0000 | INHALATION_SPRAY | Freq: Every day | RESPIRATORY_TRACT | Status: DC

## 2022-10-14 MED ORDER — TRELEGY ELLIPTA 100-62.5-25 MCG/ACT IN AEPB
1.0000 | INHALATION_SPRAY | Freq: Every day | RESPIRATORY_TRACT | 6 refills | Status: DC
Start: 2022-10-14 — End: 2022-11-04

## 2022-10-14 NOTE — Progress Notes (Signed)
Synopsis: Referred in by Sherlyn Hay, DO   Subjective:   PATIENT ID: Larry Maxwell GENDER: male DOB: [birth year], MRN: [id number]  Chief Complaint  Patient presents with   Follow-up    Wheezing and shortness of breath at night. Reports using Airsupra and Ventolin, reports moderate symptom control.     HPI Larry Maxwell is a 57 years old male patient with a past medical history of essential hypertension, HLD, MDD and sciatica presenting today to the pulmonary clinic for a follow up visit regarding his mixed restrictive obstructive lung disease.   He presented on 08/07 with complaints of shortness of breath for about a year now. Worse on exertion and uphill. Also worse when laying flat. It is associated with a productive cough of clear sputum for also about year. He does report some chest pain when he coughs a lot. He denies any chills or nightsweats. Denies any dysphagia or acid reflux. Also denies any stiff joints or rashes but does have chronic back pain with sciatica. He had PFTs done by his primary care provider that revealed mixed obstructive and restrictive lung disease with reduced DLCO. He has a cxr from 2006 on file that shows reticular opacities at the bases bilaterally.   In the interim he underwent a High Res CT chest on 09/28/2022 that showed Mild Centrilobular and paraseptal emphysema. Negative for ILD. ANA mildly positive but profile negative. Sjogren antibodies negative, scleroderma antibodies negative. Myomarker panel negative. Anti-CCP and rheumatoid factor negative.  Today, he reports having significant shortness of breath specifically when he tries to lay down at night. He has to sleep with a fan on. Sleep is poor. He does report cough with sputum production specifically in the morning.    FH: His Father passed away of lung cancer. His mother had breastcancer.   SH: Exsmoker quit 1 year ago smoked about 1 PPD for 42 years. He denies alcohol use. Denies any illicit  drug use but smokes marijuana occasionally. Currently unemployed, worked in a wearhouse for about 15 years. He has 1 cat at home. No birds or dogs. No travel outside the [place].   ROS All systems were reviewed and are negative except for the above.  Objective:   Vitals:   10/14/22 0839  Weight: 187 lb 3.2 oz (84.9 kg)  Height: 6\' 1"  (1.854 m)     on RA BMI Readings from Last 3 Encounters:  10/14/22 24.70 kg/m  09/01/22 24.70 kg/m  08/25/22 24.67 kg/m   Wt Readings from Last 3 Encounters:  10/14/22 187 lb 3.2 oz (84.9 kg)  09/01/22 187 lb 3.2 oz (84.9 kg)  08/25/22 187 lb (84.8 kg)    Physical Exam GEN: NAD HEENT: Supple Neck, Reactive Pupils, EOMI  CVS: Normal S1, Normal S2, RRR, No murmurs or ES appreciated  Lungs: Decrease air entry at the bases with faint crackles. No wheezing appreciated.  Abdomen: Soft, non tender, non distended, + BS  Extremities: Warm and well perfused, No edema  Skin: No suspicious lesions appreciated  Psych: Normal Affect  Ancillary Information   CBC    Component Value Date/Time   WBC 7.4 09/01/2022 0950   RBC 4.90 09/01/2022 0950   HGB 16.5 09/01/2022 0950   HCT 46.3 09/01/2022 0950   PLT 206 09/01/2022 0950   MCV 94.5 09/01/2022 0950   MCH 33.7 09/01/2022 0950   MCHC 35.6 09/01/2022 0950   RDW 11.8 09/01/2022 0950   LYMPHSABS 2.3 09/01/2022 0950   MONOABS 0.5 09/01/2022  0950   EOSABS 0.2 09/01/2022 0950   BASOSABS 0.0 09/01/2022 0950    Imaging  CXR 2006: Chronic lung changes - no acute process.   High Res CT chest 09/03  Mild centrilobular and paraseptal emphysema. Negative for subpleural reticulation, traction bronchiectasis/bronchiolectasis, ground glass, architectural distortion or honeycombing. 4 mm nodular density in the peripheral left lower lobe (7/92). Probable subpleural lymph nodes along the left major fissure. No pleural fluid. Airway is unremarkable. Minimal air trapping.  PFTs 08/24/2022: FVC 2.3 L 43% of predicted  FEV1 1.19 L 28% of predicted FEV1 to FVC ratio 51 with lower limit of normal 67.  Total lung capacity was reduced at 4.59 L 60% of predicted.  DLCO was reduced at 50% of predicted.     Latest Ref Rng & Units 08/24/2022    3:28 PM  PFT Results  FVC-Pre L 2.34   FVC-Predicted Pre % 43   FVC-Post L 2.90   FVC-Predicted Post % 53   Pre FEV1/FVC % % 51   Post FEV1/FCV % % 61   FEV1-Pre L 1.19   FEV1-Predicted Pre % 28   FEV1-Post L 1.78   DLCO uncorrected ml/min/mmHg 15.57   DLCO UNC% % 50   DLVA Predicted % 90   TLC L 4.59   TLC % Predicted % 60   RV % Predicted % 97      Assessment & Plan:  Larry Maxwell is a 57 years old male patient with a past medical history of essential hypertension, HLD, MDD, tobacco use in the past and sciatica presenting today to the pulmonary clinic after a pulmonary function test showing restrictive lung disease.   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   ANA mildly positive but profile negative. Sjogren antibodies negative, scleroderma antibodies negative.  []  Start Fluticasone-Umeclidinum-Vilanterol [Trelegy Elipta] 100-62.5-25 1 puff daily. []  6 min walk test.  []  Referral to pulmonary rehab []  Plan to repeat PFTs and CXR in 6 months to assess restrictive lung disease progression.   #Tobacco use disorder Quit a year ago. Counseled on importance of continuing with abstinence from smoking. []  Will discuss Enrolling in low dose CT chest for lung cancer screening during next visit.   RTC 3 months.   I spent 45 minutes caring for this patient today, including preparing to see the patient, obtaining a medical history , reviewing a separately obtained history, performing a medically appropriate examination and/or evaluation, counseling and educating the patient/family/caregiver, ordering medications, tests, or procedures, documenting  clinical information in the electronic health record, and independently interpreting results (not separately reported/billed) and communicating results to the patient/family/caregiver  Janann Colonel, MD [facility] 10/14/2022 8:40 AM

## 2022-10-19 ENCOUNTER — Encounter: Payer: Self-pay | Admitting: Pulmonary Disease

## 2022-10-19 DIAGNOSIS — J441 Chronic obstructive pulmonary disease with (acute) exacerbation: Secondary | ICD-10-CM

## 2022-10-20 ENCOUNTER — Telehealth: Payer: 59 | Admitting: Family Medicine

## 2022-10-20 DIAGNOSIS — M545 Low back pain, unspecified: Secondary | ICD-10-CM

## 2022-10-20 MED ORDER — PREDNISONE 20 MG PO TABS
40.0000 mg | ORAL_TABLET | Freq: Every day | ORAL | 0 refills | Status: AC
Start: 2022-10-20 — End: 2022-10-25

## 2022-10-20 MED ORDER — AZITHROMYCIN 500 MG PO TABS
500.0000 mg | ORAL_TABLET | Freq: Every day | ORAL | 0 refills | Status: AC
Start: 2022-10-20 — End: 2022-10-23

## 2022-10-20 MED ORDER — BREZTRI AEROSPHERE 160-9-4.8 MCG/ACT IN AERO
2.0000 | INHALATION_SPRAY | Freq: Two times a day (BID) | RESPIRATORY_TRACT | 3 refills | Status: DC
Start: 2022-10-20 — End: 2022-11-04

## 2022-10-20 NOTE — Progress Notes (Signed)
Based on what you shared with me, I feel your condition warrants further evaluation as soon as possible at an Emergency department.  Back pain with numbness and weakness after an injury should be examined in person.   NOTE: There will be NO CHARGE for this eVisit   If you are having a true medical emergency please call 911.

## 2022-10-21 ENCOUNTER — Emergency Department: Payer: 59

## 2022-10-21 ENCOUNTER — Emergency Department
Admission: EM | Admit: 2022-10-21 | Discharge: 2022-10-22 | Disposition: A | Discharge status: Home / Self Care | Payer: 59 | Attending: Emergency Medicine | Admitting: Emergency Medicine

## 2022-10-21 ENCOUNTER — Other Ambulatory Visit: Payer: Self-pay

## 2022-10-21 DIAGNOSIS — J449 Chronic obstructive pulmonary disease, unspecified: Secondary | ICD-10-CM | POA: Diagnosis not present

## 2022-10-21 DIAGNOSIS — J45901 Unspecified asthma with (acute) exacerbation: Secondary | ICD-10-CM | POA: Insufficient documentation

## 2022-10-21 DIAGNOSIS — M79601 Pain in right arm: Secondary | ICD-10-CM | POA: Diagnosis not present

## 2022-10-21 DIAGNOSIS — R0602 Shortness of breath: Secondary | ICD-10-CM | POA: Diagnosis not present

## 2022-10-21 LAB — CBC WITH DIFFERENTIAL/PLATELET
Abs Immature Granulocytes: 0.04 10*3/uL (ref 0.00–0.07)
Basophils Absolute: 0.1 10*3/uL (ref 0.0–0.1)
Basophils Relative: 1 %
Eosinophils Absolute: 0.2 10*3/uL (ref 0.0–0.5)
Eosinophils Relative: 2 %
HCT: 50.7 % (ref 39.0–52.0)
Hemoglobin: 17.5 g/dL — ABNORMAL HIGH (ref 13.0–17.0)
Immature Granulocytes: 0 %
Lymphocytes Relative: 41 %
Lymphs Abs: 3.7 10*3/uL (ref 0.7–4.0)
MCH: 33.4 pg (ref 26.0–34.0)
MCHC: 34.5 g/dL (ref 30.0–36.0)
MCV: 96.8 fL (ref 80.0–100.0)
Monocytes Absolute: 0.7 10*3/uL (ref 0.1–1.0)
Monocytes Relative: 8 %
Neutro Abs: 4.5 10*3/uL (ref 1.7–7.7)
Neutrophils Relative %: 48 %
Platelets: 235 10*3/uL (ref 150–400)
RBC: 5.24 MIL/uL (ref 4.22–5.81)
RDW: 13.2 % (ref 11.5–15.5)
WBC: 9.2 10*3/uL (ref 4.0–10.5)
nRBC: 0 % (ref 0.0–0.2)

## 2022-10-21 LAB — COMPREHENSIVE METABOLIC PANEL
ALT: 19 U/L (ref 0–44)
AST: 24 U/L (ref 15–41)
Albumin: 3.4 g/dL — ABNORMAL LOW (ref 3.5–5.0)
Alkaline Phosphatase: 68 U/L (ref 38–126)
Anion gap: 11 (ref 5–15)
BUN: 12 mg/dL (ref 6–20)
CO2: 27 mmol/L (ref 22–32)
Calcium: 8.8 mg/dL — ABNORMAL LOW (ref 8.9–10.3)
Chloride: 99 mmol/L (ref 98–111)
Creatinine, Ser: 1.39 mg/dL — ABNORMAL HIGH (ref 0.61–1.24)
GFR, Estimated: 59 mL/min — ABNORMAL LOW (ref >60)
Glucose, Bld: 100 mg/dL — ABNORMAL HIGH (ref 70–99)
Potassium: 3.8 mmol/L (ref 3.5–5.1)
Sodium: 137 mmol/L (ref 135–145)
Total Bilirubin: 0.8 mg/dL (ref 0.3–1.2)
Total Protein: 6.3 g/dL — ABNORMAL LOW (ref 6.5–8.1)

## 2022-10-21 MED ORDER — IPRATROPIUM-ALBUTEROL 0.5-2.5 (3) MG/3ML IN SOLN
6.0000 mL | Freq: Once | RESPIRATORY_TRACT | Status: AC
  Administered 2022-10-21: 6 mL via RESPIRATORY_TRACT
  Filled 2022-10-21: qty 3

## 2022-10-21 MED ORDER — NAPROXEN 500 MG PO TABS
500.0000 mg | ORAL_TABLET | Freq: Once | ORAL | Status: DC
  Filled 2022-10-21: qty 1

## 2022-10-21 MED ORDER — PREDNISONE 20 MG PO TABS
40.0000 mg | ORAL_TABLET | Freq: Once | ORAL | Status: AC
  Administered 2022-10-21: 40 mg via ORAL
  Filled 2022-10-21: qty 2

## 2022-10-21 NOTE — ED Provider Notes (Signed)
[facility] Provider Note    Event Date/Time   First MD Initiated Contact with Patient 10/21/22 2331     (approximate)   History   Arm Pain and Shortness of Breath   HPI  Larry Maxwell is a 57 year old male presenting to the emergency department for evaluation of arm pain.  Patient reports that a few months ago he had blood drawn from his right arm.  Shortly after he began to develop a dull pulsing pain in his arm.  He saw his primary care doctor who placed him on gabapentin, but reports he has not had any improvement with this.  He is concerned about possible blood clot in his arm.  He additionally reports shortness of breath.  While he was in our waiting room he reports the patient walked by that had cologne on and he began to have difficulty breathing.  He was noted to have labored respirations and was brought back to a room.  Does report a history of asthma and COPD on multiple medications.    Physical Exam   Triage Vital Signs: ED Triage Vitals  Encounter Vitals Group     BP 10/21/22 1930 (!) 163/109     Systolic BP Percentile --      Diastolic BP Percentile --      Pulse Rate 10/21/22 1930 94     Resp 10/21/22 1930 (!) 27     Temp 10/21/22 2013 97.9 F (36.6 C)     Temp Source 10/21/22 2013 Axillary     SpO2 10/21/22 1931 100 %     Weight 10/21/22 1931 187 lb (84.8 kg)     Height 10/21/22 1931 6\' 1"  (1.854 m)     Head Circumference --      Peak Flow --      Pain Score 10/21/22 1931 0     Pain Loc --      Pain Education --      Exclude from Growth Chart --     Most recent vital signs: Vitals:   10/21/22 2200 10/21/22 2209  BP:  (!) 121/91  Pulse: 70   Resp: 15   Temp:    SpO2:       General: Awake, interactive  CV:  Regular rate, good peripheral perfusion.  Resp:  Tight lung sounds bilaterally with diffuse expiratory wheezing, labored respirations, no stridor noted Abd:  Soft, nondistended.  Neuro:  Symmetric facial movement,  fluid speech MSK:   Tenderness to palpation over the right forearm without significant swelling.  2+ radial pulses bilaterally.  No overlying erythema, warmth.   ED Results / Procedures / Treatments   Labs (all labs ordered are listed, but only abnormal results are displayed) Labs Reviewed  CBC WITH DIFFERENTIAL/PLATELET - Abnormal; Notable for the following components:      Result Value   Hemoglobin 17.5 (*)    All other components within normal limits  COMPREHENSIVE METABOLIC PANEL - Abnormal; Notable for the following components:   Glucose, Bld 100 (*)    Creatinine, Ser 1.39 (*)    Calcium 8.8 (*)    Total Protein 6.3 (*)    Albumin 3.4 (*)    GFR, Estimated 59 (*)    All other components within normal limits     EKG EKG independently reviewed interpreted by myself (ER attending) demonstrates:  EKG demonstrates sinus rhythm at a rate of 90, PR 174, QRS 80, QTc 438, no acute ST changes  RADIOLOGY Imaging independently  reviewed and interpreted by myself demonstrates:  DVT ultrasound pending  PROCEDURES:  Critical Care performed: No  Procedures   MEDICATIONS ORDERED IN ED: Medications  naproxen (NAPROSYN) tablet 500 mg (500 mg Oral Not Given 10/21/22 2101)  ipratropium-albuterol (DUONEB) 0.5-2.5 (3) MG/3ML nebulizer solution 6 mL (6 mLs Nebulization Given 10/21/22 1933)  ipratropium-albuterol (DUONEB) 0.5-2.5 (3) MG/3ML nebulizer solution 6 mL (6 mLs Nebulization Given 10/21/22 2102)  predniSONE (DELTASONE) tablet 40 mg (40 mg Oral Given 10/21/22 2101)     IMPRESSION / MDM / ASSESSMENT AND PLAN / ED COURSE  I reviewed the triage vital signs and the nursing notes.  Differential diagnosis includes, but is not limited to, asthma/COPD flare secondary to recent trigger, no hypotension, rash, GI symptom suggestive of anaphylaxis very low suspicion for other cardiorespiratory process, regarding arm pain no evidence of cellulitis, consideration for possible DVT, clinical  history not suggestive of acute traumatic injury  Patient's presentation is most consistent with acute presentation with potential threat to life or bodily function.  57 year old male presenting to the emergency department initially for arm pain but developed shortness of breath in the waiting room.  He was treated with breathing treatments with improvement in his work of breathing.  He did have ongoing wheezing for which she was ordered for additional breathing treatments as well as steroids.  On reevaluation, patient appears much improved.  DVT ultrasound returned without acute abnormality.  Patient is comfortable discharge home and outpatient follow-up.  Will DC with steroid course in the setting of his asthma exacerbation.  Strict return precautions provided.    FINAL CLINICAL IMPRESSION(S) / ED DIAGNOSES   Final diagnoses:  Right arm pain  Exacerbation of asthma, unspecified asthma severity, unspecified whether persistent     Rx / DC Orders   ED Discharge Orders          Ordered    predniSONE (DELTASONE) 20 MG tablet  Daily with breakfast        10/22/22 0014             Note:  This document was prepared using Dragon voice recognition software and may include unintentional dictation errors.   Trinna Post, MD 10/22/22 [phone]

## 2022-10-21 NOTE — ED Notes (Signed)
Introduced self to patient. Pt denies needs at this time. Patient resting in bed free from sign of distress. Breathing unlabored speaking in full sentences with symmetric chest rise and fall. Bed low and locked with side rails raised x2. Call bell in reach and monitor in place.

## 2022-10-21 NOTE — ED Triage Notes (Signed)
Pt presented for L arm pain. Upon calling for triage. Pt noted to be having stridor and looking unsteady on feet. Pt reports hx of COPD and asthma and became stridorous after another pt in lobby came in with cologne on. Pt speaking in short sentences. Labored breathing noted. Pt alert and oriented. MD at bedside. Denies chest pain.   Reports concern for "messed up vein" in R arm since IV stick a couple months ago. Tenderness noted. No redness.

## 2022-10-22 MED ORDER — PREDNISONE 20 MG PO TABS: 40.0000 mg | ORAL_TABLET | Freq: Every day | ORAL | 0 refills | Status: DC

## 2022-10-22 NOTE — ED Notes (Signed)
Patient discharged at this time. Ambulated to lobby with independent and steady gait. Breathing unlabored speaking in full sentences. Verbalized understanding of all discharge, follow up, and medication teaching. Discharged homed with all belongings.   

## 2022-10-22 NOTE — Discharge Instructions (Addendum)
You were seen in the emergency department today for evaluation of arm pain and shortness of breath.  I am glad your breathing is improved.  Your ultrasound today was reassuring.  Please continue to follow with your primary care doctor for further evaluation.  Return to the ER for new or worsening symptoms.

## 2022-10-25 ENCOUNTER — Emergency Department: Payer: 59

## 2022-10-25 ENCOUNTER — Ambulatory Visit (INDEPENDENT_AMBULATORY_CARE_PROVIDER_SITE_OTHER): Payer: 59 | Admitting: Family Medicine

## 2022-10-25 ENCOUNTER — Ambulatory Visit: Payer: PRIVATE HEALTH INSURANCE | Admitting: Family Medicine

## 2022-10-25 ENCOUNTER — Other Ambulatory Visit: Payer: Self-pay

## 2022-10-25 ENCOUNTER — Emergency Department
Admission: EM | Admit: 2022-10-25 | Discharge: 2022-10-25 | Disposition: A | Discharge status: Home / Self Care | Payer: 59 | Attending: Emergency Medicine | Admitting: Emergency Medicine

## 2022-10-25 VITALS — BP 143/93 | HR 102 | Resp 30 | Wt 187.0 lb

## 2022-10-25 DIAGNOSIS — J439 Emphysema, unspecified: Secondary | ICD-10-CM | POA: Insufficient documentation

## 2022-10-25 DIAGNOSIS — Z20822 Contact with and (suspected) exposure to covid-19: Secondary | ICD-10-CM | POA: Insufficient documentation

## 2022-10-25 DIAGNOSIS — I10 Essential (primary) hypertension: Secondary | ICD-10-CM | POA: Insufficient documentation

## 2022-10-25 DIAGNOSIS — R079 Chest pain, unspecified: Secondary | ICD-10-CM | POA: Diagnosis not present

## 2022-10-25 DIAGNOSIS — J984 Other disorders of lung: Secondary | ICD-10-CM | POA: Diagnosis not present

## 2022-10-25 DIAGNOSIS — G4489 Other headache syndrome: Secondary | ICD-10-CM | POA: Diagnosis not present

## 2022-10-25 DIAGNOSIS — R0789 Other chest pain: Secondary | ICD-10-CM | POA: Diagnosis not present

## 2022-10-25 DIAGNOSIS — J441 Chronic obstructive pulmonary disease with (acute) exacerbation: Secondary | ICD-10-CM | POA: Insufficient documentation

## 2022-10-25 DIAGNOSIS — R0602 Shortness of breath: Secondary | ICD-10-CM | POA: Diagnosis not present

## 2022-10-25 DIAGNOSIS — J449 Chronic obstructive pulmonary disease, unspecified: Secondary | ICD-10-CM

## 2022-10-25 DIAGNOSIS — J4489 Other specified chronic obstructive pulmonary disease: Secondary | ICD-10-CM

## 2022-10-25 DIAGNOSIS — M79603 Pain in arm, unspecified: Secondary | ICD-10-CM | POA: Diagnosis not present

## 2022-10-25 DIAGNOSIS — R0603 Acute respiratory distress: Secondary | ICD-10-CM | POA: Diagnosis not present

## 2022-10-25 DIAGNOSIS — I491 Atrial premature depolarization: Secondary | ICD-10-CM | POA: Diagnosis not present

## 2022-10-25 DIAGNOSIS — R059 Cough, unspecified: Secondary | ICD-10-CM | POA: Diagnosis not present

## 2022-10-25 LAB — CBC WITH DIFFERENTIAL/PLATELET
Abs Immature Granulocytes: 0.08 10*3/uL — ABNORMAL HIGH (ref 0.00–0.07)
Basophils Absolute: 0 10*3/uL (ref 0.0–0.1)
Basophils Relative: 0 %
Eosinophils Absolute: 0 10*3/uL (ref 0.0–0.5)
Eosinophils Relative: 0 %
HCT: 43.7 % (ref 39.0–52.0)
Hemoglobin: 15 g/dL (ref 13.0–17.0)
Immature Granulocytes: 1 %
Lymphocytes Relative: 18 %
Lymphs Abs: 2.1 10*3/uL (ref 0.7–4.0)
MCH: 33 pg (ref 26.0–34.0)
MCHC: 34.3 g/dL (ref 30.0–36.0)
MCV: 96 fL (ref 80.0–100.0)
Monocytes Absolute: 0.5 10*3/uL (ref 0.1–1.0)
Monocytes Relative: 5 %
Neutro Abs: 9.1 10*3/uL — ABNORMAL HIGH (ref 1.7–7.7)
Neutrophils Relative %: 76 %
Platelets: 227 10*3/uL (ref 150–400)
RBC: 4.55 MIL/uL (ref 4.22–5.81)
RDW: 13.2 % (ref 11.5–15.5)
WBC: 11.9 10*3/uL — ABNORMAL HIGH (ref 4.0–10.5)
nRBC: 0 % (ref 0.0–0.2)

## 2022-10-25 LAB — RESP PANEL BY RT-PCR (RSV, FLU A&B, COVID)  RVPGX2
Influenza A by PCR: NEGATIVE
Influenza B by PCR: NEGATIVE
Resp Syncytial Virus by PCR: NEGATIVE
SARS Coronavirus 2 by RT PCR: NEGATIVE

## 2022-10-25 LAB — TROPONIN I (HIGH SENSITIVITY): Troponin I (High Sensitivity): 4 ng/L (ref <18)

## 2022-10-25 LAB — BASIC METABOLIC PANEL
Anion gap: 11 (ref 5–15)
BUN: 16 mg/dL (ref 6–20)
CO2: 22 mmol/L (ref 22–32)
Calcium: 8.7 mg/dL — ABNORMAL LOW (ref 8.9–10.3)
Chloride: 106 mmol/L (ref 98–111)
Creatinine, Ser: 1.29 mg/dL — ABNORMAL HIGH (ref 0.61–1.24)
GFR, Estimated: >60 mL/min (ref >60)
Glucose, Bld: 120 mg/dL — ABNORMAL HIGH (ref 70–99)
Potassium: 3.7 mmol/L (ref 3.5–5.1)
Sodium: 139 mmol/L (ref 135–145)

## 2022-10-25 MED ORDER — IPRATROPIUM-ALBUTEROL 0.5-2.5 (3) MG/3ML IN SOLN
3.0000 mL | Freq: Once | RESPIRATORY_TRACT | Status: AC
  Administered 2022-10-25: 3 mL via RESPIRATORY_TRACT
  Filled 2022-10-25: qty 3

## 2022-10-25 MED ORDER — PREDNISONE 20 MG PO TABS
60.0000 mg | ORAL_TABLET | Freq: Once | ORAL | Status: AC
  Administered 2022-10-25: 60 mg via ORAL
  Filled 2022-10-25: qty 3

## 2022-10-25 MED ORDER — LIDOCAINE 5 % EX PTCH
1.0000 | MEDICATED_PATCH | Freq: Once | CUTANEOUS | Status: DC
  Administered 2022-10-25: 1 via TRANSDERMAL
  Filled 2022-10-25: qty 1

## 2022-10-25 MED ORDER — TRELEGY ELLIPTA 100-62.5-25 MCG/ACT IN AEPB
1.0000 | INHALATION_SPRAY | Freq: Every day | RESPIRATORY_TRACT | Status: DC
Start: 2022-10-25 — End: 2022-11-04

## 2022-10-25 MED ORDER — ALBUTEROL SULFATE HFA 108 (90 BASE) MCG/ACT IN AERS: 2.0000 | INHALATION_SPRAY | Freq: Four times a day (QID) | RESPIRATORY_TRACT | 0 refills | Status: DC | PRN

## 2022-10-25 MED ORDER — NITROGLYCERIN 0.4 MG SL SUBL
0.4000 mg | SUBLINGUAL_TABLET | Freq: Once | SUBLINGUAL | Status: AC
Start: 2022-10-25 — End: 2022-10-25
  Administered 2022-10-25: 0.4 mg via SUBLINGUAL

## 2022-10-25 MED ORDER — ASPIRIN 81 MG PO CHEW
324.0000 mg | CHEWABLE_TABLET | Freq: Once | ORAL | Status: AC
Start: 2022-10-25 — End: 2022-10-25
  Administered 2022-10-25: 324 mg via ORAL

## 2022-10-25 MED ORDER — PREDNISONE 20 MG PO TABS: 60.0000 mg | ORAL_TABLET | Freq: Every day | ORAL | 0 refills | Status: AC

## 2022-10-25 MED ORDER — ASPIRIN 81 MG PO CHEW
81.0000 mg | CHEWABLE_TABLET | Freq: Once | ORAL | Status: DC
Start: 2022-10-25 — End: 2022-10-25

## 2022-10-25 MED ORDER — ASPIRIN 81 MG PO CHEW
81.0000 mg | CHEWABLE_TABLET | Freq: Once | ORAL | Status: AC
Start: 2022-10-25 — End: 2022-10-25
  Administered 2022-10-25: 81 mg via ORAL

## 2022-10-25 NOTE — Assessment & Plan Note (Addendum)
Unable to address today due to the emergent nature of patient's presentation. Did give patient two samples of Trelegy to help in the interim until he can obtain the long-term prescription sent by his pulmonologist. Did advise him, as I had planned for this visit, to stop carvedilol due to his history of asthma and potential for increased bronchospasm in relation to this.

## 2022-10-25 NOTE — Assessment & Plan Note (Signed)
Patient presented to clinic with severe chest pain which had been worsening since Saturday.  Given concern that this was cardiac related, an EKG was obtained which showed ventricular trigeminy and ST depression in V4, V5 and V6. 324 mg of aspirin given, as well as 0.4 mg of sublingual nitroglycerin. EMS services were called and patient was transported to the emergency department.

## 2022-10-25 NOTE — ED Triage Notes (Signed)
Pt arrives via [facility] from PCP appointment with c/o CP and SOB for 2-3 days. Pt denies taking any medication for the cough. CP is on the right side and pt is tender on the upper abdomen. Pt is coughing and unable to cough anything up. Pt received 324 of ASA and 1 nitroglycerine tablet at the PCP's office. Pt has a hx copd, asthma, and HTN.

## 2022-10-25 NOTE — Assessment & Plan Note (Deleted)
Group B, CAT 35 per pulm 10/14/2022

## 2022-10-25 NOTE — Progress Notes (Unsigned)
Established patient visit   Patient: Larry Maxwell   DOB: [birth year]   57 y.o. Male  MRN: [id number] Visit Date: 10/25/2022  Today's healthcare provider: Sherlyn Hay, DO   Chief Complaint  Patient presents with   Chest Pain    Seen in ER for right arm pain Thursday, 10/21/22.  Chest pain started Saturday and became progressively worse. Had been unable to obtain Trelegy prescription due to high cost. Was sent home from ER with Air Supra which he has been taking twice daily.    Subjective    HPI Seen in ER for right arm pain related to previous blood draw on 10/21/2022 and experienced acute asthma exacerbation after a patient wearing cologne walked by.  Ultrasound negative for DVT; patient discharged on prednisone for asthma exacerbation.  TODAY: Started having chest pain Saturday which has been worsening.  Has also had persistent cough since then. Had previously been unable to get Trelegy prescription was given the Airsupra in the ER which he has been taking twice daily since then. Has had multiple course of prednisone in past two weeks.  Complains of chest pain as well as headache with persistent non-productive cough  No relief with gabapentin? No, taking five 100 mg capsules at a time   {History (Optional):23778}  Medications: Outpatient Medications Prior to Visit  Medication Sig   Albuterol-Budesonide (AIRSUPRA) 90-80 MCG/ACT AERO Inhale 2 puffs into the lungs every 4 (four) hours as needed (shortness of breath/wheezing/coughing).   amLODipine (NORVASC) 10 MG tablet TAKE 1 TABLET (10 MG TOTAL) BY MOUTH DAILY FOR 30 DOSES   Budeson-Glycopyrrol-Formoterol (BREZTRI AEROSPHERE) 160-9-4.8 MCG/ACT AERO Inhale 2 puffs into the lungs in the morning and at bedtime.   carvedilol (COREG) 12.5 MG tablet TAKE 1 TABLET BY MOUTH TWICE A DAY   celecoxib (CELEBREX) 100 MG capsule Take 1 capsule (100 mg total) by mouth 2 (two) times daily.   cetirizine (ZYRTEC) 10 MG tablet Take 1  tablet (10 mg total) by mouth daily.   chlorthalidone (HYGROTON) 25 MG tablet TAKE 1 TABLET (25 MG TOTAL) BY MOUTH DAILY.   escitalopram (LEXAPRO) 20 MG tablet Take 1 tablet (20 mg total) by mouth daily.   fluticasone (FLONASE) 50 MCG/ACT nasal spray Place 1 spray into both nostrils daily.   Fluticasone-Umeclidin-Vilant (TRELEGY ELLIPTA) 100-62.5-25 MCG/ACT AEPB Inhale 1 puff into the lungs daily.   Fluticasone-Umeclidin-Vilant (TRELEGY ELLIPTA) 100-62.5-25 MCG/ACT AEPB Inhale 1 puff into the lungs daily.   gabapentin (NEURONTIN) 300 MG capsule Take 1 tab morning, 1 tab at midday, and 2 tabs before bedtime   methocarbamol (ROBAXIN) 500 MG tablet Take 500 mg by mouth 4 (four) times daily.   rosuvastatin (CRESTOR) 10 MG tablet Take 1 tablet (10 mg total) by mouth daily.   valsartan (DIOVAN) 40 MG tablet TAKE 1 TABLET (40 MG TOTAL) BY MOUTH DAILY. START TAKING AFTER ONE WEEK, IF TOP BP NUMBER IS >125   [DISCONTINUED] albuterol (VENTOLIN HFA) 108 (90 Base) MCG/ACT inhaler Inhale 1 puff into the lungs every 6 (six) hours as needed for wheezing or shortness of breath.   [DISCONTINUED] predniSONE (DELTASONE) 20 MG tablet Take 2 tablets (40 mg total) by mouth daily with breakfast for 5 days.   [DISCONTINUED] predniSONE (DELTASONE) 20 MG tablet Take 2 tablets (40 mg total) by mouth daily with breakfast for 4 days.   No facility-administered medications prior to visit.    Review of Systems  Respiratory:  Positive for cough, chest tightness and shortness  of breath. Negative for wheezing.   Cardiovascular:  Positive for chest pain.     {Insert previous labs (optional):23779} {See past labs  Heme  Chem  Endocrine  Serology  Results Review (optional):1}   Objective    BP (!) 143/93 (BP Location: Left Arm)   Pulse (!) 102   Resp (!) 30   Wt 187 lb (84.8 kg)   SpO2 100%   BMI 24.67 kg/m  {Insert last BP/Wt (optional):23777}{See vitals history (optional):1}   Physical Exam Constitutional:       General: He is in acute distress.     Appearance: He is ill-appearing. He is not diaphoretic.  Eyes:     General: No scleral icterus.    Comments: Redness to sclera bilaterally (R>L)  Cardiovascular:     Rate and Rhythm: Tachycardia present. Rhythm irregular.  Pulmonary:     Effort: Respiratory distress present.     Breath sounds: Rhonchi present. No wheezing.  Musculoskeletal:     Right lower leg: No edema.     Left lower leg: No edema.  Skin:    General: Skin is warm and dry.  Neurological:     General: No focal deficit present.     Mental Status: He is alert and oriented to person, place, and time.  Psychiatric:        Behavior: Behavior normal.       Assessment & Plan    Chest pain with high risk of acute coronary syndrome -     Nitroglycerin -     Aspirin -     EKG 12-Lead -     Aspirin  Mixed restrictive and obstructive lung disease (HCC) Assessment & Plan: Unable to address today due to the emergent nature of patient's presentation.   Asthma-COPD overlap syndrome Assessment & Plan: Unable to address today due to the emergent nature of patient's presentation. Did give patient two samples of Trelegy to help in the interim until he can obtain the long-term prescription sent by his pulmonologist.  Orders: -     Trelegy Ellipta; Inhale 1 puff into the lungs daily for 28 doses.  Stage 4 very severe COPD by GOLD classification [facility]) Assessment & Plan: Unable to address today due to the emergent nature of patient's presentation.   Primary hypertension    Did advise him, as I'd planned for this visit, to stop carvedilol due to my concern regarding him being on nonselective betablocker with history of asthma   ECG showed trigeminy Sent via EMS to ER [phone] gave nitroglycerin 0.4 SL ASA 324mg  - chewed   Gave (low-dose) trelegy sample  No follow-ups on file.      I discussed the assessment and treatment plan with the patient  The patient was provided  an opportunity to ask questions and all were answered. The patient agreed with the plan and demonstrated an understanding of the instructions.   The patient was advised to call back or seek an in-person evaluation if the symptoms worsen or if the condition fails to improve as anticipated.    Sherlyn Hay, DO  [facility] [phone] (phone) [phone] (fax)  [facility]

## 2022-10-25 NOTE — ED Provider Notes (Signed)
[facility] Provider Note    Event Date/Time   First MD Initiated Contact with Patient 10/25/22 [phone]     (approximate)   History   Chief Complaint Chest Pain and Shortness of Breath   HPI  Larry Maxwell is a 57 y.o. male with past medical history of hypertension and COPD who presents to the ED complaining of chest pain and shortness of breath.  Patient reports that for the past 2 days he has been dealing with a frequent cough, feeling like there is something in his chest that he needs to clear but cannot quite do it.  This been associated with dull aching pain in his chest that is worse when he goes to cough.  He is also felt slightly short of breath but denies any fevers.  He has been using his albuterol inhaler without significant relief, initially went to his PCPs office today who contacted EMS for transport to the ED.     Physical Exam   Triage Vital Signs: ED Triage Vitals  Encounter Vitals Group     BP      Systolic BP Percentile      Diastolic BP Percentile      Pulse      Resp      Temp      Temp src      SpO2      Weight      Height      Head Circumference      Peak Flow      Pain Score      Pain Loc      Pain Education      Exclude from Growth Chart     Most recent vital signs: Vitals:   10/25/22 1130 10/25/22 1200  BP: (!) 129/99 (!) 129/91  Pulse: 71 78  Resp: 19 17  Temp:    SpO2: 93% 94%    Constitutional: Alert and oriented. Eyes: Conjunctivae are normal. Head: Atraumatic. Nose: No congestion/rhinnorhea. Mouth/Throat: Mucous membranes are moist.  Cardiovascular: Normal rate, regular rhythm. Grossly normal heart sounds.  2+ radial pulses bilaterally. Respiratory: Normal respiratory effort.  No retractions. Lungs with expiratory wheezing bilaterally.  Anterior chest wall tenderness to palpation noted. Gastrointestinal: Soft and nontender. No distention. Musculoskeletal: No lower extremity tenderness nor edema.   Neurologic:  Normal speech and language. No gross focal neurologic deficits are appreciated.    ED Results / Procedures / Treatments   Labs (all labs ordered are listed, but only abnormal results are displayed) Labs Reviewed  CBC WITH DIFFERENTIAL/PLATELET - Abnormal; Notable for the following components:      Result Value   WBC 11.9 (*)    Neutro Abs 9.1 (*)    Abs Immature Granulocytes 0.08 (*)    All other components within normal limits  BASIC METABOLIC PANEL - Abnormal; Notable for the following components:   Glucose, Bld 120 (*)    Creatinine, Ser 1.29 (*)    Calcium 8.7 (*)    All other components within normal limits  RESP PANEL BY RT-PCR (RSV, FLU A&B, COVID)  RVPGX2  TROPONIN I (HIGH SENSITIVITY)     EKG  ED ECG REPORT I, Chesley Noon, the attending physician, personally viewed and interpreted this ECG.   Date: 10/25/2022  EKG Time: 9:14  Rate: 99  Rhythm: normal sinus rhythm, frequent PVCs  Axis: LAD  Intervals:none  ST&T Change: None  RADIOLOGY Chest x-ray reviewed and interpreted by me with no infiltrate,  edema, or effusion.  PROCEDURES:  Critical Care performed: No  Procedures   MEDICATIONS ORDERED IN ED: Medications  lidocaine (LIDODERM) 5 % 1 patch (1 patch Transdermal Patch Applied 10/25/22 0936)  ipratropium-albuterol (DUONEB) 0.5-2.5 (3) MG/3ML nebulizer solution 3 mL (3 mLs Nebulization Given 10/25/22 0937)  predniSONE (DELTASONE) tablet 60 mg (60 mg Oral Given 10/25/22 0935)  ipratropium-albuterol (DUONEB) 0.5-2.5 (3) MG/3ML nebulizer solution 3 mL (3 mLs Nebulization Given 10/25/22 1050)  ipratropium-albuterol (DUONEB) 0.5-2.5 (3) MG/3ML nebulizer solution 3 mL (3 mLs Nebulization Given 10/25/22 1233)     IMPRESSION / MDM / ASSESSMENT AND PLAN / ED COURSE  I reviewed the triage vital signs and the nursing notes.                              57 y.o. male with past medical history of hypertension and COPD who presents to the ED  complaining of cough, chest pain, and increasing difficulty breathing over the past 2 days.  Patient's presentation is most consistent with acute presentation with potential threat to life or bodily function.  Differential diagnosis includes, but is not limited to, ACS, PE, pneumonia, pneumothorax, musculoskeletal pain, GERD, influenza, COVID-19, anemia, electrolyte abnormality.  Patient nontoxic-appearing and in no acute distress, vital signs remarkable for mild tachypnea but otherwise reassuring.  Patient is not in any respiratory distress and maintaining oxygen saturations on room air.  He does have some expiratory wheezing and we will treat with DuoNeb as well as dose of prednisone.  EKG shows no evidence of arrhythmia or ischemia, troponin pending at this time but low suspicion for ACS or PE given symptoms seem more consistent with infectious etiology.  Chest x-ray and COVID testing are also pending at this time.  Troponin within normal limits and I doubt ACS or PE, chest x-ray is also unremarkable.  Remainder of labs reassuring with no significant anemia, leukocytosis, tract abnormality, or AKI.  COVID and flu testing is negative, patient reports feeling much better following dose of prednisone and multiple DuoNebs.  Wheezing resolved on reassessment and tachypnea is also resolved.  Patient appropriate for outpatient management, was recently prescribed prednisone and we will restart this at 60 mg instead of 40 mg.  He was prescribed a rescue inhaler and counseled to follow-up with his PCP, otherwise return to the ED for new or worsening symptoms.  Patient agrees with plan.      FINAL CLINICAL IMPRESSION(S) / ED DIAGNOSES   Final diagnoses:  COPD exacerbation (HCC)  Atypical chest pain     Rx / DC Orders   ED Discharge Orders          Ordered    predniSONE (DELTASONE) 20 MG tablet  Daily with breakfast        10/25/22 1239    albuterol (VENTOLIN HFA) 108 (90 Base) MCG/ACT inhaler   Every 6 hours PRN       Note to Pharmacy: Please supply with spacer   10/25/22 1239             Note:  This document was prepared using Dragon voice recognition software and may include unintentional dictation errors.   Chesley Noon, MD 10/25/22 [phone]

## 2022-10-25 NOTE — Assessment & Plan Note (Signed)
Unable to address today due to the emergent nature of patient's presentation.

## 2022-10-26 ENCOUNTER — Telehealth: Payer: Self-pay | Admitting: Pulmonary Disease

## 2022-10-26 ENCOUNTER — Ambulatory Visit (INDEPENDENT_AMBULATORY_CARE_PROVIDER_SITE_OTHER): Payer: 59 | Admitting: Pulmonary Disease

## 2022-10-26 ENCOUNTER — Encounter: Payer: Self-pay | Admitting: Pulmonary Disease

## 2022-10-26 VITALS — BP 138/80 | HR 70 | Temp 98.1°F | Ht 73.0 in | Wt 187.8 lb

## 2022-10-26 DIAGNOSIS — J441 Chronic obstructive pulmonary disease with (acute) exacerbation: Secondary | ICD-10-CM

## 2022-10-26 MED ORDER — IPRATROPIUM-ALBUTEROL 0.5-2.5 (3) MG/3ML IN SOLN
3.0000 mL | Freq: Four times a day (QID) | RESPIRATORY_TRACT | Status: DC
Start: 2022-10-26 — End: 2022-11-04

## 2022-10-26 NOTE — Telephone Encounter (Signed)
I have spoke with the patient. He said he is still having SOB. He was given Albuterol at the ED and he is having to use it.  I offered him an Acute visit today. He will come in at 2:30pm.  Nothing further needed.

## 2022-10-26 NOTE — Telephone Encounter (Signed)
Pt states he was in ED on 9/26 and 9/30 has tight chest, unproductive cough, feels his symptoms are getting worse not better even with change in inhaler. Gets SOB just walking from car to door.

## 2022-10-26 NOTE — Progress Notes (Signed)
Synopsis: Referred in by Sherlyn Hay, DO   Subjective:   PATIENT ID: Larry Maxwell GENDER: male DOB: 11/10/1965, MRN: [id number]  Chief Complaint  Patient presents with   Follow-up    Wheezing and shortness of breath at night. Reports using Airsupra and Ventolin, reports moderate symptom control.     HPI Mr. Capozzi is a 57 years old male patient with a past medical history of essential hypertension, HLD, MDD and sciatica presenting today to the pulmonary clinic for a follow up visit regarding his mixed restrictive obstructive lung disease.   He presented on 08/07 with complaints of shortness of breath for about a year now. Worse on exertion and uphill. Also worse when laying flat. It is associated with a productive cough of clear sputum for also about year. He does report some chest pain when he coughs a lot. He denies any chills or nightsweats. Denies any dysphagia or acid reflux. Also denies any stiff joints or rashes but does have chronic back pain with sciatica. He had PFTs done by his primary care provider that revealed mixed obstructive and restrictive lung disease with reduced DLCO. He has a cxr from 2006 on file that shows reticular opacities at the bases bilaterally.   In the interim he underwent a High Res CT chest on 09/28/2022 that showed Mild Centrilobular and paraseptal emphysema. Negative for ILD. ANA mildly positive but profile negative. Sjogren antibodies negative, scleroderma antibodies negative. Myomarker panel negative. Anti-CCP and rheumatoid factor negative.  Presented today as acute visit, with worsening shortness of breath and gasping for air. He called 3 days ago with similar complaints. Started him on prednisone 40mg  for 5 days and azithromycin. Slightly improving. He continues with [place] and Trelegy. Unfortunately continues to smoke.    FH: His Father passed away of lung cancer. His mother had breastcancer.   SH: Exsmoker quit 1 year ago smoked about 1  PPD for 42 years. He denies alcohol use. Denies any illicit drug use but smokes marijuana occasionally. Currently unemployed, worked in a wearhouse for about 15 years. He has 1 cat at home. No birds or dogs. No travel outside the [place].   ROS All systems were reviewed and are negative except for the above.  Objective:   Vitals:   10/14/22 0839  Weight: 187 lb 3.2 oz (84.9 kg)  Height: 6\' 1"  (1.854 m)     on RA BMI Readings from Last 3 Encounters:  10/14/22 24.70 kg/m  09/01/22 24.70 kg/m  08/25/22 24.67 kg/m   Wt Readings from Last 3 Encounters:  10/14/22 187 lb 3.2 oz (84.9 kg)  09/01/22 187 lb 3.2 oz (84.9 kg)  08/25/22 187 lb (84.8 kg)    Physical Exam GEN: NAD HEENT: Supple Neck, Reactive Pupils, EOMI  CVS: Normal S1, Normal S2, RRR, No murmurs or ES appreciated  Lungs: Decrease air entry at the bases with faint crackles. No wheezing appreciated.  Abdomen: Soft, non tender, non distended, + BS  Extremities: Warm and well perfused, No edema  Skin: No suspicious lesions appreciated  Psych: Normal Affect  Ancillary Information   CBC    Component Value Date/Time   WBC 7.4 09/01/2022 0950   RBC 4.90 09/01/2022 0950   HGB 16.5 09/01/2022 0950   HCT 46.3 09/01/2022 0950   PLT 206 09/01/2022 0950   MCV 94.5 09/01/2022 0950   MCH 33.7 09/01/2022 0950   MCHC 35.6 09/01/2022 0950   RDW 11.8 09/01/2022 0950   LYMPHSABS 2.3 09/01/2022 0950  MONOABS 0.5 09/01/2022 0950   EOSABS 0.2 09/01/2022 0950   BASOSABS 0.0 09/01/2022 0950    Imaging  CXR 2006: Chronic lung changes - no acute process.   High Res CT chest 09/03  Mild centrilobular and paraseptal emphysema. Negative for subpleural reticulation, traction bronchiectasis/bronchiolectasis, ground glass, architectural distortion or honeycombing. 4 mm nodular density in the peripheral left lower lobe (7/92). Probable subpleural lymph nodes along the left major fissure. No pleural fluid. Airway is unremarkable. Minimal  air trapping.  PFTs 08/24/2022: FVC 2.3 L 43% of predicted FEV1 1.19 L 28% of predicted FEV1 to FVC ratio 51 with lower limit of normal 67.  Total lung capacity was reduced at 4.59 L 60% of predicted.  DLCO was reduced at 50% of predicted.     Latest Ref Rng & Units 08/24/2022    3:28 PM  PFT Results  FVC-Pre L 2.34   FVC-Predicted Pre % 43   FVC-Post L 2.90   FVC-Predicted Post % 53   Pre FEV1/FVC % % 51   Post FEV1/FCV % % 61   FEV1-Pre L 1.19   FEV1-Predicted Pre % 28   FEV1-Post L 1.78   DLCO uncorrected ml/min/mmHg 15.57   DLCO UNC% % 50   DLVA Predicted % 90   TLC L 4.59   TLC % Predicted % 60   RV % Predicted % 97      Assessment & Plan:  Mr. Huy is a 58 years old male patient with a past medical history of essential hypertension, HLD, MDD, tobacco use in the past and sciatica presenting today to the pulmonary clinic after a pulmonary function test showing restrictive lung disease.   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   ANA mildly positive but profile negative. Sjogren antibodies negative, scleroderma antibodies negative.  One of the concerns is that he is unable to afford his inhalers and more improtantly concern for inability to take strong deep breath for medication delivery is raised. Therefore we will proceed with switching to nebulizer treatment.   []  c/w Fluticasone-Umeclidinum-Vilanterol [Trelegy Elipta] 100-62.5-25 1 puff daily for now until neb treatment received.  []  c/w Albuterol 2puffs Q6H  []  Prescribed Duonebs Q6H Sched once received will stop trelegy elipta.  []  c/w Prednisone 40mg  PO daily and call back in 48 hours to let us know how you feel. Might need to extend course.  []  Referral to pulmonary rehab []  Plan to repeat PFTs and CXR in 6 months to assess restrictive lung disease progression.    #Tobacco use disorder Quit a year ago. Counseled on importance of continuing with abstinence from smoking. []  Will discuss Enrolling in low dose CT chest for lung cancer screening during next visit.   RTC 3 months.   I spent 60 minutes caring for this patient today, including preparing to see the patient, obtaining a medical history , reviewing a separately obtained history, performing a medically appropriate examination and/or evaluation, counseling and educating the patient/family/caregiver, ordering medications, tests, or procedures, documenting clinical information in the electronic health record, and independently interpreting results (not separately reported/billed) and communicating results to the patient/family/caregiver  Janann Colonel, MD [facility] 10/14/2022 8:40 AM

## 2022-10-27 ENCOUNTER — Encounter: Payer: Self-pay | Admitting: Family Medicine

## 2022-10-27 ENCOUNTER — Other Ambulatory Visit: Payer: Self-pay | Admitting: Family Medicine

## 2022-10-27 ENCOUNTER — Ambulatory Visit: Payer: 59

## 2022-10-27 DIAGNOSIS — R0603 Acute respiratory distress: Secondary | ICD-10-CM | POA: Insufficient documentation

## 2022-10-27 DIAGNOSIS — I10 Essential (primary) hypertension: Secondary | ICD-10-CM

## 2022-10-27 NOTE — Assessment & Plan Note (Signed)
Unable to address today due to the emergent nature of patient's presentation. Did advise him, as I had planned for this visit, to stop carvedilol due to his history of asthma and potential for increased bronchospasm in relation to this. Will reassess on follow-up and consider starting patient on a selective beta-blocker, such as metoprolol or bisoprolol, if his blood pressure remains elevated in the absence of carvedilol.

## 2022-10-27 NOTE — Assessment & Plan Note (Signed)
Patient also appeared to be in moderate respiratory distress, with a respiratory rate in the 30s, though his oxygen saturation remained at 100% during his visit.

## 2022-10-28 ENCOUNTER — Telehealth: Payer: Self-pay | Admitting: Pulmonary Disease

## 2022-10-28 DIAGNOSIS — J449 Chronic obstructive pulmonary disease, unspecified: Secondary | ICD-10-CM | POA: Diagnosis not present

## 2022-10-28 DIAGNOSIS — J4489 Other specified chronic obstructive pulmonary disease: Secondary | ICD-10-CM | POA: Diagnosis not present

## 2022-10-28 MED ORDER — IPRATROPIUM-ALBUTEROL 0.5-2.5 (3) MG/3ML IN SOLN
3.0000 mL | Freq: Four times a day (QID) | RESPIRATORY_TRACT | 3 refills | Status: DC | PRN
  Filled 2022-12-20: qty 90, 8d supply, fill #0
  Filled 2022-12-21: qty 270, 22d supply, fill #0
  Filled 2023-07-07: qty 360, 30d supply, fill #1
  Filled 2023-10-02: qty 360, 30d supply, fill #2

## 2022-10-28 NOTE — Telephone Encounter (Signed)
Noted  

## 2022-10-28 NOTE — Telephone Encounter (Signed)
Pt is feeling better and he has not rcvd his neb machine and will like to know when to pick it up

## 2022-10-28 NOTE — Telephone Encounter (Signed)
Pt is aware of rx pick up at [facility] and Christoper Allegra has been in touch. Nothing further needed.

## 2022-10-28 NOTE — Telephone Encounter (Signed)
Order placed to [facility] on 10/26/2022 for nebulizer machine.  Lm for patient. Recommend that he contact [facility] for update at  [phone].

## 2022-10-28 NOTE — Addendum Note (Signed)
Addended by: Lajoyce Lauber A on: 10/28/2022 02:41 PM   Modules accepted: Orders

## 2022-10-29 ENCOUNTER — Other Ambulatory Visit: Payer: Self-pay | Admitting: Family Medicine

## 2022-10-29 DIAGNOSIS — Z1211 Encounter for screening for malignant neoplasm of colon: Secondary | ICD-10-CM

## 2022-10-29 DIAGNOSIS — Z1212 Encounter for screening for malignant neoplasm of rectum: Secondary | ICD-10-CM

## 2022-11-01 ENCOUNTER — Ambulatory Visit: Payer: 59 | Admitting: Family Medicine

## 2022-11-03 ENCOUNTER — Encounter: Payer: Self-pay | Admitting: Pulmonary Disease

## 2022-11-04 ENCOUNTER — Encounter: Payer: Self-pay | Admitting: Family Medicine

## 2022-11-04 ENCOUNTER — Encounter: Payer: Self-pay | Admitting: *Deleted

## 2022-11-04 ENCOUNTER — Ambulatory Visit (INDEPENDENT_AMBULATORY_CARE_PROVIDER_SITE_OTHER): Payer: 59 | Admitting: Family Medicine

## 2022-11-04 VITALS — BP 162/114 | HR 107 | Ht 73.0 in | Wt 191.4 lb

## 2022-11-04 DIAGNOSIS — J4489 Other specified chronic obstructive pulmonary disease: Secondary | ICD-10-CM

## 2022-11-04 DIAGNOSIS — F321 Major depressive disorder, single episode, moderate: Secondary | ICD-10-CM | POA: Diagnosis not present

## 2022-11-04 DIAGNOSIS — J449 Chronic obstructive pulmonary disease, unspecified: Secondary | ICD-10-CM | POA: Diagnosis not present

## 2022-11-04 DIAGNOSIS — J984 Other disorders of lung: Secondary | ICD-10-CM | POA: Diagnosis not present

## 2022-11-04 DIAGNOSIS — M5442 Lumbago with sciatica, left side: Secondary | ICD-10-CM

## 2022-11-04 DIAGNOSIS — I10 Essential (primary) hypertension: Secondary | ICD-10-CM

## 2022-11-04 DIAGNOSIS — G8929 Other chronic pain: Secondary | ICD-10-CM

## 2022-11-04 DIAGNOSIS — R053 Chronic cough: Secondary | ICD-10-CM | POA: Diagnosis not present

## 2022-11-04 DIAGNOSIS — J439 Emphysema, unspecified: Secondary | ICD-10-CM

## 2022-11-04 MED ORDER — SERTRALINE HCL 50 MG PO TABS
50.0000 mg | ORAL_TABLET | Freq: Every day | ORAL | 3 refills | Status: DC
Start: 2022-11-04 — End: 2023-07-07
  Filled 2022-12-20: qty 30, 30d supply, fill #0
  Filled 2023-01-18: qty 30, 30d supply, fill #1
  Filled 2023-03-26 – 2023-03-31 (×3): qty 30, 30d supply, fill #2
  Filled 2023-05-05: qty 30, 30d supply, fill #3

## 2022-11-04 MED ORDER — VALSARTAN 40 MG PO TABS
40.0000 mg | ORAL_TABLET | Freq: Every day | ORAL | 0 refills | Status: DC
Start: 2022-11-04 — End: 2022-11-24

## 2022-11-04 MED ORDER — MONTELUKAST SODIUM 10 MG PO TABS
10.0000 mg | ORAL_TABLET | Freq: Every day | ORAL | 0 refills | Status: DC
Start: 2022-11-04 — End: 2023-01-13
  Filled 2022-12-20: qty 30, 30d supply, fill #0

## 2022-11-04 MED ORDER — BENZONATATE 100 MG PO CAPS
100.0000 mg | ORAL_CAPSULE | Freq: Two times a day (BID) | ORAL | 0 refills | Status: DC | PRN
Start: 2022-11-04 — End: 2022-11-24

## 2022-11-04 MED ORDER — METOPROLOL SUCCINATE ER 25 MG PO TB24
25.0000 mg | ORAL_TABLET | Freq: Every day | ORAL | 0 refills | Status: DC
Start: 2022-11-04 — End: 2022-11-24

## 2022-11-04 NOTE — Progress Notes (Signed)
Established patient visit   Patient: Larry Maxwell   DOB: [birth year]   57 y.o. Male  MRN: [id number] Visit Date: 11/04/2022  Today's healthcare provider: Sherlyn Hay, DO   Chief Complaint  Patient presents with   Medical Management of Chronic Issues    Follow up on severe COPD, would like a letter stating issues for disability    Subjective    HPI  COPD, Follow up  He was last seen for this 9 days ago. Changes made include prescribed DuoNebs to be taken every 6 hours by pulmonology (patient was to stop Trelegy upon receiving this).  Patient was also to continue with 40 mg prednisone daily (extended course, finished Monday).   -Patient was referred to pulmonary rehab   -Plan by pulmonology to repeat PFTs and chest x-ray in 6 months to assess restrictive lung disease progression, Using nebulizer every four hours  Chokes all the time Goes to bed 8:30 and is awake by 2:30am. Unable to sleep any further for the rest of the day Breaks down all the time and cries all the time Has chest pain all the time and coughs  Did the sleep study last night. Attorney saids he needs a letter from PCP Becomes exhausted trying to clean his room   He reports excellent compliance with treatment. He is not having side effects.  He IS experiencing dyspnea, cough, and fatigue. He is NOT experiencing weight increase, weight loss, chills, fever, or increased sputum. he reports breathing is Improved. However, he does continue to experience significant dyspnea on exertion  Pulmonary Functions Testing Results:  TLC  Date Value Ref Range Status  08/24/2022 4.59 L Final   Patient does follow-up with pulmonology and last saw them 10/26/2022.  Next on12/19/24 -----------------------------------------------------------------------------------------  Not able to follow-up with [place] [facility] for counseling due to it being in [place], as he lacks transportation and financial  resources.     Medications: Outpatient Medications Prior to Visit  Medication Sig   albuterol (VENTOLIN HFA) 108 (90 Base) MCG/ACT inhaler Inhale 2 puffs into the lungs every 6 (six) hours as needed for wheezing or shortness of breath.   celecoxib (CELEBREX) 100 MG capsule Take 1 capsule (100 mg total) by mouth 2 (two) times daily.   cetirizine (ZYRTEC) 10 MG tablet Take 1 tablet (10 mg total) by mouth daily.   gabapentin (NEURONTIN) 300 MG capsule Take 1 tab morning, 1 tab at midday, and 2 tabs before bedtime   ipratropium-albuterol (DUONEB) 0.5-2.5 (3) MG/3ML SOLN Take 3 mLs by nebulization every 6 (six) hours as needed.   methocarbamol (ROBAXIN) 500 MG tablet Take 500 mg by mouth 4 (four) times daily.   rosuvastatin (CRESTOR) 10 MG tablet Take 1 tablet (10 mg total) by mouth daily.   [DISCONTINUED] Albuterol-Budesonide (AIRSUPRA) 90-80 MCG/ACT AERO Inhale 2 puffs into the lungs every 4 (four) hours as needed (shortness of breath/wheezing/coughing).   [DISCONTINUED] Budeson-Glycopyrrol-Formoterol (BREZTRI AEROSPHERE) 160-9-4.8 MCG/ACT AERO Inhale 2 puffs into the lungs in the morning and at bedtime.   [DISCONTINUED] escitalopram (LEXAPRO) 20 MG tablet Take 1 tablet (20 mg total) by mouth daily.   [DISCONTINUED] Fluticasone-Umeclidin-Vilant (TRELEGY ELLIPTA) 100-62.5-25 MCG/ACT AEPB Inhale 1 puff into the lungs daily.   [DISCONTINUED] Fluticasone-Umeclidin-Vilant (TRELEGY ELLIPTA) 100-62.5-25 MCG/ACT AEPB Inhale 1 puff into the lungs daily.   [DISCONTINUED] Fluticasone-Umeclidin-Vilant (TRELEGY ELLIPTA) 100-62.5-25 MCG/ACT AEPB Inhale 1 puff into the lungs daily for 28 doses.   [DISCONTINUED] valsartan (DIOVAN) 40 MG tablet TAKE 1  TABLET (40 MG TOTAL) BY MOUTH DAILY. START TAKING AFTER ONE WEEK, IF TOP BP NUMBER IS >125   amLODipine (NORVASC) 10 MG tablet TAKE 1 TABLET (10 MG TOTAL) BY MOUTH DAILY FOR 30 DOSES   chlorthalidone (HYGROTON) 25 MG tablet TAKE 1 TABLET (25 MG TOTAL) BY MOUTH  DAILY.   [DISCONTINUED] carvedilol (COREG) 12.5 MG tablet TAKE 1 TABLET BY MOUTH TWICE A DAY (Patient not taking: Reported on 11/04/2022)   [DISCONTINUED] fluticasone (FLONASE) 50 MCG/ACT nasal spray Place 1 spray into both nostrils daily. (Patient not taking: Reported on 11/04/2022)   [DISCONTINUED] ipratropium-albuterol (DUONEB) 0.5-2.5 (3) MG/3ML nebulizer solution 3 mL    No facility-administered medications prior to visit.    Review of Systems  Constitutional:  Positive for chills (stays cold) and fatigue. Negative for fever.  HENT:  Negative for sore throat.   Respiratory:  Positive for cough (dry,dull) and shortness of breath.   Cardiovascular:  Positive for chest pain (aching mid chest, worse with cough) and palpitations.  Psychiatric/Behavioral:  Positive for sleep disturbance.          Objective    BP (!) 162/114 (BP Location: Right Arm, Patient Position: Sitting, Cuff Size: Normal)   Pulse (!) 107   Ht 6\' 1"  (1.854 m)   Wt 191 lb 6.4 oz (86.8 kg)   SpO2 100%   BMI 25.25 kg/m     Physical Exam Constitutional:      Appearance: Normal appearance.  HENT:     Head: Normocephalic and atraumatic.  Eyes:     General: No scleral icterus.    Extraocular Movements: Extraocular movements intact.     Conjunctiva/sclera: Conjunctivae normal.  Cardiovascular:     Rate and Rhythm: Normal rate and regular rhythm.     Pulses: Normal pulses.     Heart sounds: Normal heart sounds.  Pulmonary:     Effort: Pulmonary effort is normal. No respiratory distress.     Breath sounds: Rhonchi (at bases) present. No wheezing.  Musculoskeletal:     Right lower leg: Edema (trace) present.     Left lower leg: Edema (trace) present.  Skin:    General: Skin is warm and dry.  Neurological:     Mental Status: He is alert and oriented to person, place, and time. Mental status is at baseline.  Psychiatric:        Attention and Perception: Attention normal.        Mood and Affect: Mood is  anxious and depressed. Affect is tearful.        Behavior: Behavior normal. Behavior is cooperative.        Thought Content: Thought content normal.        Cognition and Memory: Cognition normal.        Judgment: Judgment normal.     No results found for any visits on 11/04/22.  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  Dispense: 30 tablet; Refill: 0 -     Benzonatate; Take 1 capsule (100 mg total) by mouth 2 (two) times daily as needed for cough.  Dispense: 20 capsule; Refill: 0 -     AMB Referral [facility]  Care Management  Asthma-COPD overlap syndrome (HCC) -     AMB Referral [facility] Care Management  Mixed restrictive and obstructive lung disease (HCC) -     AMB Referral [facility] Care Management  Primary hypertension Assessment & Plan: Discontinued carvedilol at previous visit but was unable to start patient on new medication at that visit due to need for transfer to ER; start metoprolol XL 25 mg daily Continue chlorthalidone 25 mg daily Continue amlodipine 10 mg daily Start valsartan 40 mg daily  Orders: -     Valsartan; Take 1 tablet (40 mg total) by mouth daily. Start taking after one week, if top BP number is >125  Dispense: 30 tablet; Refill: 0 -     Metoprolol Succinate ER; Take 1 tablet (25 mg total) by mouth daily.  Dispense: 90 tablet; Refill: 0  Depression, major, single episode, moderate (HCC) Assessment & Plan: Continue Zoloft 50 mg daily Referred patient to alternative psychiatrist  Orders: -     Ambulatory referral to Psychiatry -     Sertraline HCl; Take 1 tablet (50 mg total) by mouth daily.  Dispense: 30 tablet; Refill: 3 -     AMB Referral [facility]  Care Management  Chronic cough -     Benzonatate; Take 1 capsule (100 mg total) by mouth 2 (two) times daily as needed for cough.  Dispense: 20 capsule; Refill: 0  Chronic midline low back pain with left-sided sciatica Assessment & Plan: Continue celecoxib 100 mg twice daily Continue gabapentin 300 mg, 1 tablet in the morning and at midday, and 2 tablets before bedtime  Orders: -     AMB Referral [facility] Care Management    Return in about 4 weeks (around 12/02/2022) for HTN.      I discussed the assessment and treatment plan with the patient  The patient was provided an opportunity to ask questions and all were answered. The patient agreed with the plan and demonstrated an understanding of the instructions.   The patient was advised to call back or seek an in-person evaluation if the symptoms worsen or if the condition fails to improve as anticipated.  Total time was 40 minutes. That includes chart review before the visit, the actual patient visit, and time spent on documentation after the visit.    Sherlyn Hay, DO  [facility] [phone] (phone) [phone] (fax)  [facility]

## 2022-11-04 NOTE — Patient Instructions (Addendum)
Take a 1/2 tablet escitalopram daily for three days, then stop escitalopram and start sertraline.

## 2022-11-08 ENCOUNTER — Other Ambulatory Visit: Payer: Self-pay

## 2022-11-08 ENCOUNTER — Encounter: Payer: 59 | Attending: Pulmonary Disease

## 2022-11-08 ENCOUNTER — Encounter: Payer: Self-pay | Admitting: Family Medicine

## 2022-11-08 DIAGNOSIS — J441 Chronic obstructive pulmonary disease with (acute) exacerbation: Secondary | ICD-10-CM | POA: Insufficient documentation

## 2022-11-08 DIAGNOSIS — J449 Chronic obstructive pulmonary disease, unspecified: Secondary | ICD-10-CM

## 2022-11-08 NOTE — Progress Notes (Signed)
Virtual Visit completed. Patient informed on EP and RD appointment and 6 Minute walk test. Patient also informed of patient health questionnaires on My Chart. Patient Verbalizes understanding. Visit diagnosis can be found in [facility] 10/14/2022.

## 2022-11-08 NOTE — Telephone Encounter (Signed)
Dr. Larinda Buttery, please advise. Thanks

## 2022-11-09 ENCOUNTER — Encounter: Payer: Self-pay | Admitting: Pulmonary Disease

## 2022-11-09 DIAGNOSIS — Z1211 Encounter for screening for malignant neoplasm of colon: Secondary | ICD-10-CM | POA: Diagnosis not present

## 2022-11-09 DIAGNOSIS — Z1212 Encounter for screening for malignant neoplasm of rectum: Secondary | ICD-10-CM | POA: Diagnosis not present

## 2022-11-10 ENCOUNTER — Encounter: Payer: Self-pay | Admitting: Family Medicine

## 2022-11-10 NOTE — Assessment & Plan Note (Signed)
Continue DuoNebs as prescribed by pulmonology Continue albuterol inhaler 2 puffs every 6 hours as needed Start montelukast 10 mg daily Start benzonatate 100 mg 2 times daily as needed

## 2022-11-10 NOTE — Assessment & Plan Note (Signed)
Continue Zoloft 50 mg daily Referred patient to alternative psychiatrist

## 2022-11-10 NOTE — Assessment & Plan Note (Addendum)
Discontinued carvedilol at previous visit but was unable to start patient on new medication at that visit due to need for transfer to ER; start metoprolol XL 25 mg daily Continue chlorthalidone 25 mg daily Continue amlodipine 10 mg daily Start valsartan 40 mg daily

## 2022-11-10 NOTE — Assessment & Plan Note (Signed)
Continue celecoxib 100 mg twice daily Continue gabapentin 300 mg, 1 tablet in the morning and at midday, and 2 tablets before bedtime

## 2022-11-11 ENCOUNTER — Telehealth: Payer: Self-pay | Admitting: *Deleted

## 2022-11-11 VITALS — Ht 73.0 in | Wt 190.5 lb

## 2022-11-11 DIAGNOSIS — J441 Chronic obstructive pulmonary disease with (acute) exacerbation: Secondary | ICD-10-CM | POA: Diagnosis present

## 2022-11-11 DIAGNOSIS — J449 Chronic obstructive pulmonary disease, unspecified: Secondary | ICD-10-CM

## 2022-11-11 NOTE — Progress Notes (Signed)
  Care Coordination  Outreach Note  11/11/2022 Name: Larry Maxwell MRN: [id number] DOB: 1965-10-11   Care Coordination Outreach Attempts: An unsuccessful telephone outreach was attempted today to offer the patient information about available care coordination services.  Follow Up Plan:  Additional outreach attempts will be made to offer the patient care coordination information and services.   Encounter Outcome:  No Answer  Burman Nieves, CCMA Care Coordination Care Guide Direct Dial: [phone]

## 2022-11-11 NOTE — Progress Notes (Signed)
Pulmonary Individual Treatment Plan  Patient Details  Name: Larry Maxwell MRN: [id number] Date of Birth: Jan 13, 1966 Referring Provider:   Flowsheet Row Pulmonary Rehab from 11/11/2022 in [facility] Cardiac and Pulmonary Rehab  Referring Provider Jean-Pierre Assaker       Initial Encounter Date:  Flowsheet Row Pulmonary Rehab from 11/11/2022 in [facility] Cardiac and Pulmonary Rehab  Date 11/11/22       Visit Diagnosis: Chronic obstructive pulmonary disease, unspecified COPD type (HCC)  Patient's Home Medications on Admission:  Current Outpatient Medications:    albuterol (VENTOLIN HFA) 108 (90 Base) MCG/ACT inhaler, Inhale 2 puffs into the lungs every 6 (six) hours as needed for wheezing or shortness of breath., Disp: 8 g, Rfl: 0   amLODipine (NORVASC) 10 MG tablet, TAKE 1 TABLET (10 MG TOTAL) BY MOUTH DAILY FOR 30 DOSES, Disp: 30 tablet, Rfl: 2   benzonatate (TESSALON) 100 MG capsule, Take 1 capsule (100 mg total) by mouth 2 (two) times daily as needed for cough., Disp: 20 capsule, Rfl: 0   celecoxib (CELEBREX) 100 MG capsule, Take 1 capsule (100 mg total) by mouth 2 (two) times daily., Disp: 60 capsule, Rfl: 2   cetirizine (ZYRTEC) 10 MG tablet, Take 1 tablet (10 mg total) by mouth daily., Disp: 30 tablet, Rfl: 11   chlorthalidone (HYGROTON) 25 MG tablet, TAKE 1 TABLET (25 MG TOTAL) BY MOUTH DAILY., Disp: 90 tablet, Rfl: 0   gabapentin (NEURONTIN) 300 MG capsule, Take 1 tab morning, 1 tab at midday, and 2 tabs before bedtime, Disp: 120 capsule, Rfl: 0   ipratropium-albuterol (DUONEB) 0.5-2.5 (3) MG/3ML SOLN, Take 3 mLs by nebulization every 6 (six) hours as needed., Disp: 360 mL, Rfl: 3   methocarbamol (ROBAXIN) 500 MG tablet, Take 500 mg by mouth 4 (four) times daily., Disp: , Rfl:    metoprolol succinate (TOPROL-XL) 25 MG 24 hr tablet, Take 1 tablet (25 mg total) by mouth daily., Disp: 90 tablet, Rfl: 0   montelukast (SINGULAIR) 10 MG tablet, Take 1 tablet (10 mg total) by mouth at  bedtime., Disp: 30 tablet, Rfl: 0   rosuvastatin (CRESTOR) 10 MG tablet, Take 1 tablet (10 mg total) by mouth daily., Disp: 90 tablet, Rfl: 3   sertraline (ZOLOFT) 50 MG tablet, Take 1 tablet (50 mg total) by mouth daily., Disp: 30 tablet, Rfl: 3   valsartan (DIOVAN) 40 MG tablet, Take 1 tablet (40 mg total) by mouth daily. Start taking after one week, if top BP number is >125, Disp: 30 tablet, Rfl: 0  Past Medical History: Past Medical History:  Diagnosis Date   Allergy    Asthma    Depression    Gynecomastia, male 07/21/2022   Hypertension     Tobacco Use: Social History   Tobacco Use  Smoking Status Former   Current packs/day: 0.00   Average packs/day: 1.5 packs/day for 20.0 years (30.0 ttl pk-yrs)   Types: Cigarettes   Start date: 12/19/2001   Quit date: 12/19/2021   Years since quitting: 0.8  Smokeless Tobacco Never    Labs: Review Flowsheet       Latest Ref Rng & Units 07/23/2022  Labs for ITP Cardiac and Pulmonary Rehab  Cholestrol 100 - 199 mg/dL 409   LDL (calc) 0 - 99 mg/dL 811   HDL-C >91 mg/dL 47   Trlycerides 0 - 478 mg/dL 295     Details             Pulmonary Assessment Scores:  Pulmonary Assessment Scores  Row Name 11/11/22 1325         ADL UCSD   ADL Phase Entry     SOB Score total 53     Rest 4     Walk 3     Stairs 3     Bath 4     Dress 2     Shop 0       CAT Score   CAT Score 31       mMRC Score   mMRC Score 2              UCSD: Self-administered rating of dyspnea associated with activities of daily living (ADLs) 6-point scale (0 = "not at all" to 5 = "maximal or unable to do because of breathlessness")  Scoring Scores range from 0 to 120.  Minimally important difference is 5 units  CAT: CAT can identify the health impairment of COPD patients and is better correlated with disease progression.  CAT has a scoring range of zero to 40. The CAT score is classified into four groups of low (less than 10), medium (10 -  20), high (21-30) and very high (31-40) based on the impact level of disease on health status. A CAT score over 10 suggests significant symptoms.  A worsening CAT score could be explained by an exacerbation, poor medication adherence, poor inhaler technique, or progression of COPD or comorbid conditions.  CAT MCID is 2 points  mMRC: mMRC (Modified Medical Research Council) Dyspnea Scale is used to assess the degree of baseline functional disability in patients of respiratory disease due to dyspnea. No minimal important difference is established. A decrease in score of 1 point or greater is considered a positive change.   Pulmonary Function Assessment:  Pulmonary Function Assessment - 11/08/22 0938       Breath   Shortness of Breath Yes             Exercise Target Goals: Exercise Program Goal: Individual exercise prescription set using results from initial 6 min walk test and THRR while considering  patient's activity barriers and safety.   Exercise Prescription Goal: Initial exercise prescription builds to 30-45 minutes a day of aerobic activity, 2-3 days per week.  Home exercise guidelines will be given to patient during program as part of exercise prescription that the participant will acknowledge.  Education: Aerobic Exercise: - Group verbal and visual presentation on the components of exercise prescription. Introduces F.I.T.T principle from ACSM for exercise prescriptions.  Reviews F.I.T.T. principles of aerobic exercise including progression. Written material given at graduation.   Education: Resistance Exercise: - Group verbal and visual presentation on the components of exercise prescription. Introduces F.I.T.T principle from ACSM for exercise prescriptions  Reviews F.I.T.T. principles of resistance exercise including progression. Written material given at graduation.    Education: Exercise & Equipment Safety: - Individual verbal instruction and demonstration of equipment  use and safety with use of the equipment. Flowsheet Row Pulmonary Rehab from 11/11/2022 in [facility] Cardiac and Pulmonary Rehab  Date 11/11/22  Educator NT  Instruction Review Code 1- Verbalizes Understanding       Education: Exercise Physiology & General Exercise Guidelines: - Group verbal and written instruction with models to review the exercise physiology of the cardiovascular system and associated critical values. Provides general exercise guidelines with specific guidelines to those with heart or lung disease.    Education: Flexibility, Balance, Mind/Body Relaxation: - Group verbal and visual presentation with interactive activity on the components of exercise prescription. Introduces  F.I.T.T principle from ACSM for exercise prescriptions. Reviews F.I.T.T. principles of flexibility and balance exercise training including progression. Also discusses the mind body connection.  Reviews various relaxation techniques to help reduce and manage stress (i.e. Deep breathing, progressive muscle relaxation, and visualization). Balance handout provided to take home. Written material given at graduation.   Activity Barriers & Risk Stratification:  Activity Barriers & Cardiac Risk Stratification - 11/11/22 1332       Activity Barriers & Cardiac Risk Stratification   Activity Barriers Back Problems;Shortness of Breath;Other (comment)    Comments Sciatica L nerve             6 Minute Walk:  6 Minute Walk     Row Name 11/11/22 1329         6 Minute Walk   Phase Initial     Distance 1030 feet     Walk Time 6 minutes     # of Rest Breaks 0     MPH 1.95     METS 3.65     RPE 12     Perceived Dyspnea  2     VO2 Peak 12.78     Symptoms Yes (comment)     Comments cough, chest toghtness/pain 7/10     Resting HR 78 bpm     Resting BP 124/78     Resting Oxygen Saturation  98 %     Exercise Oxygen Saturation  during 6 min walk 94 %     Max Ex. HR 107 bpm     Max Ex. BP 148/84     2  Minute Post BP 128/76       Interval HR   1 Minute HR 107     2 Minute HR 100     3 Minute HR 103     4 Minute HR 99     5 Minute HR 103     6 Minute HR 96     2 Minute Post HR 87     Interval Heart Rate? Yes       Interval Oxygen   Interval Oxygen? Yes     Baseline Oxygen Saturation % 98 %     1 Minute Oxygen Saturation % 98 %     1 Minute Liters of Oxygen 0 L  RA     2 Minute Oxygen Saturation % 99 %     2 Minute Liters of Oxygen 0 L     3 Minute Oxygen Saturation % 98 %     3 Minute Liters of Oxygen 0 L     4 Minute Oxygen Saturation % 94 %     4 Minute Liters of Oxygen 0 L     5 Minute Oxygen Saturation % 97 %     5 Minute Liters of Oxygen 0 L     6 Minute Oxygen Saturation % 98 %     6 Minute Liters of Oxygen 0 L     2 Minute Post Oxygen Saturation % 99 %     2 Minute Post Liters of Oxygen 0 L             Oxygen Initial Assessment:  Oxygen Initial Assessment - 11/08/22 0938       Home Oxygen   Home Oxygen Device None    Sleep Oxygen Prescription None    Home Exercise Oxygen Prescription None    Home Resting Oxygen Prescription None      Initial 6 min Walk  Oxygen Used None      Program Oxygen Prescription   Program Oxygen Prescription None      Intervention   Short Term Goals To learn and understand importance of maintaining oxygen saturations>88%;To learn and understand importance of monitoring SPO2 with pulse oximeter and demonstrate accurate use of the pulse oximeter.;To learn and demonstrate proper pursed lip breathing techniques or other breathing techniques. ;To learn and demonstrate proper use of respiratory medications    Long  Term Goals Verbalizes importance of monitoring SPO2 with pulse oximeter and return demonstration;Maintenance of O2 saturations>88%;Exhibits proper breathing techniques, such as pursed lip breathing or other method taught during program session;Compliance with respiratory medication;Demonstrates proper use of MDI's              Oxygen Re-Evaluation:   Oxygen Discharge (Final Oxygen Re-Evaluation):   Initial Exercise Prescription:  Initial Exercise Prescription - 11/11/22 1300       Date of Initial Exercise RX and Referring Provider   Date 11/11/22    Referring Provider [place] Assaker      Oxygen   Maintain Oxygen Saturation 88% or higher      Treadmill   MPH 1.8    Grade 0.5    Minutes 15    METs 2.5      NuStep   Level 2   T6 nustep   SPM 80    Minutes 15    METs 3.65      REL-XR   Level 3    Speed 50    Minutes 15    METs 3.65      Prescription Details   Frequency (times per week) 2    Duration Progress to 30 minutes of continuous aerobic without signs/symptoms of physical distress      Intensity   THRR 40-80% of Max Heartrate 112-146    Ratings of Perceived Exertion 11-13    Perceived Dyspnea 0-4      Progression   Progression Continue to progress workloads to maintain intensity without signs/symptoms of physical distress.      Resistance Training   Training Prescription Yes    Weight 4 lb    Reps 10-15             Perform Capillary Blood Glucose checks as needed.  Exercise Prescription Changes:   Exercise Prescription Changes     Row Name 11/11/22 1300             Response to Exercise   Blood Pressure (Admit) 124/78       Blood Pressure (Exercise) 148/84       Blood Pressure (Exit) 128/76       Heart Rate (Admit) 78 bpm       Heart Rate (Exercise) 107 bpm       Heart Rate (Exit) 87 bpm       Oxygen Saturation (Admit) 98 %       Oxygen Saturation (Exercise) 94 %       Oxygen Saturation (Exit) 99 %       Rating of Perceived Exertion (Exercise) 12       Perceived Dyspnea (Exercise) 2       Symptoms cogh, chest tightness 7/10       Comments Results                Exercise Comments:   Exercise Goals and Review:   Exercise Goals     Row Name 11/11/22 1332  Exercise Goals   Increase Physical Activity Yes        Intervention Provide advice, education, support and counseling about physical activity/exercise needs.;Develop an individualized exercise prescription for aerobic and resistive training based on initial evaluation findings, risk stratification, comorbidities and participant's personal goals.       Expected Outcomes Short Term: Attend rehab on a regular basis to increase amount of physical activity.;Long Term: Add in home exercise to make exercise part of routine and to increase amount of physical activity.;Long Term: Exercising regularly at least 3-5 days a week.       Increase Strength and Stamina Yes       Intervention Provide advice, education, support and counseling about physical activity/exercise needs.;Develop an individualized exercise prescription for aerobic and resistive training based on initial evaluation findings, risk stratification, comorbidities and participant's personal goals.       Expected Outcomes Short Term: Increase workloads from initial exercise prescription for resistance, speed, and METs.;Short Term: Perform resistance training exercises routinely during rehab and add in resistance training at home;Long Term: Improve cardiorespiratory fitness, muscular endurance and strength as measured by increased METs and functional capacity ( )       Able to understand and use rate of perceived exertion (RPE) scale Yes       Intervention Provide education and explanation on how to use RPE scale       Expected Outcomes Short Term: Able to use RPE daily in rehab to express subjective intensity level;Long Term:  Able to use RPE to guide intensity level when exercising independently       Able to understand and use Dyspnea scale Yes       Intervention Provide education and explanation on how to use Dyspnea scale       Expected Outcomes Short Term: Able to use Dyspnea scale daily in rehab to express subjective sense of shortness of breath during exertion;Long Term: Able to use Dyspnea scale  to guide intensity level when exercising independently       Knowledge and understanding of Target Heart Rate Range (THRR) Yes       Intervention Provide education and explanation of THRR including how the numbers were predicted and where they are located for reference       Expected Outcomes Short Term: Able to state/look up THRR;Long Term: Able to use THRR to govern intensity when exercising independently;Short Term: Able to use daily as guideline for intensity in rehab       Able to check pulse independently Yes       Intervention Provide education and demonstration on how to check pulse in carotid and radial arteries.;Review the importance of being able to check your own pulse for safety during independent exercise       Expected Outcomes Short Term: Able to explain why pulse checking is important during independent exercise;Long Term: Able to check pulse independently and accurately       Understanding of Exercise Prescription Yes       Intervention Provide education, explanation, and written materials on patient's individual exercise prescription       Expected Outcomes Short Term: Able to explain program exercise prescription;Long Term: Able to explain home exercise prescription to exercise independently                Exercise Goals Re-Evaluation :   Discharge Exercise Prescription (Final Exercise Prescription Changes):  Exercise Prescription Changes - 11/11/22 1300       Response to Exercise   Blood Pressure (  Admit) 124/78    Blood Pressure (Exercise) 148/84    Blood Pressure (Exit) 128/76    Heart Rate (Admit) 78 bpm    Heart Rate (Exercise) 107 bpm    Heart Rate (Exit) 87 bpm    Oxygen Saturation (Admit) 98 %    Oxygen Saturation (Exercise) 94 %    Oxygen Saturation (Exit) 99 %    Rating of Perceived Exertion (Exercise) 12    Perceived Dyspnea (Exercise) 2    Symptoms cogh, chest tightness 7/10    Comments Results             Nutrition:  Target Goals:  Understanding of nutrition guidelines, daily intake of sodium 1500mg , cholesterol 200mg , calories 30% from fat and 7% or less from saturated fats, daily to have 5 or more servings of fruits and vegetables.  Education: All About Nutrition: -Group instruction provided by verbal, written material, interactive activities, discussions, models, and posters to present general guidelines for heart healthy nutrition including fat, fiber, MyPlate, the role of sodium in heart healthy nutrition, utilization of the nutrition label, and utilization of this knowledge for meal planning. Follow up email sent as well. Written material given at graduation. Flowsheet Row Pulmonary Rehab from 11/11/2022 in [facility] Cardiac and Pulmonary Rehab  Education need identified 11/11/22       Biometrics:  Pre Biometrics - 11/11/22 1333       Pre Biometrics   Height 6\' 1"  (1.854 m)    Weight 190 lb 8 oz (86.4 kg)    Waist Circumference 40 inches    Hip Circumference 41 inches    Waist to Hip Ratio 0.98 %    BMI (Calculated) 25.14    Single Leg Stand 10.4 seconds              Nutrition Therapy Plan and Nutrition Goals:  Nutrition Therapy & Goals - 11/11/22 1324       Intervention Plan   Intervention Prescribe, educate and counsel regarding individualized specific dietary modifications aiming towards targeted core components such as weight, hypertension, lipid management, diabetes, heart failure and other comorbidities.    Expected Outcomes Short Term Goal: A plan has been developed with personal nutrition goals set during dietitian appointment.;Short Term Goal: Understand basic principles of dietary content, such as calories, fat, sodium, cholesterol and nutrients.;Long Term Goal: Adherence to prescribed nutrition plan.             Nutrition Assessments:  MEDIFICTS Score Key: >=70 Need to make dietary changes  40-70 Heart Healthy Diet <= 40 Therapeutic Level Cholesterol Diet  Flowsheet Row Pulmonary  Rehab from 11/11/2022 in [facility] Cardiac and Pulmonary Rehab  Picture Your Plate Total Score on Admission 41      Picture Your Plate Scores: <16 Unhealthy dietary pattern with much room for improvement. 41-50 Dietary pattern unlikely to meet recommendations for good health and room for improvement. 51-60 More healthful dietary pattern, with some room for improvement.  >60 Healthy dietary pattern, although there may be some specific behaviors that could be improved.   Nutrition Goals Re-Evaluation:   Nutrition Goals Discharge (Final Nutrition Goals Re-Evaluation):   Psychosocial: Target Goals: Acknowledge presence or absence of significant depression and/or stress, maximize coping skills, provide positive support system. Participant is able to verbalize types and ability to use techniques and skills needed for reducing stress and depression.   Education: Stress, Anxiety, and Depression - Group verbal and visual presentation to define topics covered.  Reviews how body is impacted by  stress, anxiety, and depression.  Also discusses healthy ways to reduce stress and to treat/manage anxiety and depression.  Written material given at graduation.   Education: Sleep Hygiene -Provides group verbal and written instruction about how sleep can affect your health.  Define sleep hygiene, discuss sleep cycles and impact of sleep habits. Review good sleep hygiene tips.    Initial Review & Psychosocial Screening:  Initial Psych Review & Screening - 11/08/22 0940       Initial Review   Current issues with Current Sleep Concerns;Current Anxiety/Panic      Family Dynamics   Good Support System? No    Strains Intra-family strains    Comments He states he does not have a great family support system. He preferes to deal with his stressors himself. At night he has trouble sleeping and gets shot of breath.      Barriers   Psychosocial barriers to participate in program The patient should benefit from  training in stress management and relaxation.;There are no identifiable barriers or psychosocial needs.      Screening Interventions   Interventions Encouraged to exercise;To provide support and resources with identified psychosocial needs;Provide feedback about the scores to participant    Expected Outcomes Short Term goal: Utilizing psychosocial counselor, staff and physician to assist with identification of specific Stressors or current issues interfering with healing process. Setting desired goal for each stressor or current issue identified.;Long Term Goal: Stressors or current issues are controlled or eliminated.;Short Term goal: Identification and review with participant of any Quality of Life or Depression concerns found by scoring the questionnaire.;Long Term goal: The participant improves quality of Life and PHQ9 Scores as seen by post scores and/or verbalization of changes             Quality of Life Scores:  Scores of 19 and below usually indicate a poorer quality of life in these areas.  A difference of  2-3 points is a clinically meaningful difference.  A difference of 2-3 points in the total score of the Quality of Life Index has been associated with significant improvement in overall quality of life, self-image, physical symptoms, and general health in studies assessing change in quality of life.  PHQ-9: Review Flowsheet  More data exists      11/11/2022 11/04/2022 08/25/2022 08/17/2022 08/04/2022  Depression screen PHQ 2/9  Decreased Interest 2 1 2 2 2   Down, Depressed, Hopeless 3 1 3 2 2   PHQ - 2 Score 5 2 5 4 4   Altered sleeping 3 3 2 3 3   Tired, decreased energy 2 3 2 2 2   Change in appetite 1 2 2 2 1   Feeling bad or failure about yourself  1 2 2 2 2   Trouble concentrating 0 2 2 1 2   Moving slowly or fidgety/restless 1 2 1 1  0  Suicidal thoughts 1 1 2 1 1   PHQ-9 Score 14 17 18 16 15   Difficult doing work/chores Somewhat difficult - Not difficult at all Somewhat  difficult Not difficult at all    Details           Interpretation of Total Score  Total Score Depression Severity:  1-4 = Minimal depression, 5-9 = Mild depression, 10-14 = Moderate depression, 15-19 = Moderately severe depression, 20-27 = Severe depression   Psychosocial Evaluation and Intervention:  Psychosocial Evaluation - 11/08/22 0945       Psychosocial Evaluation & Interventions   Interventions Encouraged to exercise with the program and follow exercise prescription;Relaxation  education;Stress management education    Comments He states he does not have a great family support system. He preferes to deal with his stressors himself. At night he has trouble sleeping and gets shot of breath.    Expected Outcomes Short: Start [facility] to help with mood. Long: Maintain a healthy mental state.    Continue Psychosocial Services  Follow up required by staff             Psychosocial Re-Evaluation:   Psychosocial Discharge (Final Psychosocial Re-Evaluation):   Education: Education Goals: Education classes will be provided on a weekly basis, covering required topics. Participant will state understanding/return demonstration of topics presented.  Learning Barriers/Preferences:  Learning Barriers/Preferences - 11/08/22 0939       Learning Barriers/Preferences   Learning Barriers None    Learning Preferences None             General Pulmonary Education Topics:  Infection Prevention: - Provides verbal and written material to individual with discussion of infection control including proper hand washing and proper equipment cleaning during exercise session. Flowsheet Row Pulmonary Rehab from 11/11/2022 in [facility] Cardiac and Pulmonary Rehab  Date 11/11/22  Educator NT  Instruction Review Code 1- Verbalizes Understanding       Falls Prevention: - Provides verbal and written material to individual with discussion of falls prevention and safety. Flowsheet Row  Pulmonary Rehab from 11/11/2022 in [facility] Cardiac and Pulmonary Rehab  Date 11/11/22  Educator NT  Instruction Review Code 1- Verbalizes Understanding       Chronic Lung Disease Review: - Group verbal instruction with posters, models, PowerPoint presentations and videos,  to review new updates, new respiratory medications, new advancements in procedures and treatments. Providing information on websites and "800" numbers for continued self-education. Includes information about supplement oxygen, available portable oxygen systems, continuous and intermittent flow rates, oxygen safety, concentrators, and Medicare reimbursement for oxygen. Explanation of Pulmonary Drugs, including class, frequency, complications, importance of spacers, rinsing mouth after steroid MDI's, and proper cleaning methods for nebulizers. Review of basic lung anatomy and physiology related to function, structure, and complications of lung disease. Review of risk factors. Discussion about methods for diagnosing sleep apnea and types of masks and machines for OSA. Includes a review of the use of types of environmental controls: home humidity, furnaces, filters, dust mite/pet prevention, HEPA vacuums. Discussion about weather changes, air quality and the benefits of nasal washing. Instruction on Warning signs, infection symptoms, calling MD promptly, preventive modes, and value of vaccinations. Review of effective airway clearance, coughing and/or vibration techniques. Emphasizing that all should Create an Action Plan. Written material given at graduation. Flowsheet Row Pulmonary Rehab from 11/11/2022 in [facility] Cardiac and Pulmonary Rehab  Education need identified 11/11/22       AED/CPR: - Group verbal and written instruction with the use of models to demonstrate the basic use of the AED with the basic ABC's of resuscitation.    Anatomy and Cardiac Procedures: - Group verbal and visual presentation and models provide information  about basic cardiac anatomy and function. Reviews the testing methods done to diagnose heart disease and the outcomes of the test results. Describes the treatment choices: Medical Management, Angioplasty, or Coronary Bypass Surgery for treating various heart conditions including Myocardial Infarction, Angina, Valve Disease, and Cardiac Arrhythmias.  Written material given at graduation.   Medication Safety: - Group verbal and visual instruction to review commonly prescribed medications for heart and lung disease. Reviews the medication, class of the drug, and side  effects. Includes the steps to properly store meds and maintain the prescription regimen.  Written material given at graduation.   Other: -Provides group and verbal instruction on various topics (see comments)   Knowledge Questionnaire Score:  Knowledge Questionnaire Score - 11/11/22 1324       Knowledge Questionnaire Score   Pre Score 15/18              Core Components/Risk Factors/Patient Goals at Admission:  Personal Goals and Risk Factors at Admission - 11/08/22 0939       Core Components/Risk Factors/Patient Goals on Admission    Weight Management Yes;Weight Maintenance    Intervention Weight Management: Develop a combined nutrition and exercise program designed to reach desired caloric intake, while maintaining appropriate intake of nutrient and fiber, sodium and fats, and appropriate energy expenditure required for the weight goal.;Weight Management: Provide education and appropriate resources to help participant work on and attain dietary goals.;Weight Management/Obesity: Establish reasonable short term and long term weight goals.    Expected Outcomes Short Term: Continue to assess and modify interventions until short term weight is achieved;Long Term: Adherence to nutrition and physical activity/exercise program aimed toward attainment of established weight goal;Weight Maintenance: Understanding of the daily  nutrition guidelines, which includes 25-35% calories from fat, 7% or less cal from saturated fats, less than 200mg  cholesterol, less than 1.5gm of sodium, & 5 or more servings of fruits and vegetables daily;Understanding recommendations for meals to include 15-35% energy as protein, 25-35% energy from fat, 35-60% energy from carbohydrates, less than 200mg  of dietary cholesterol, 20-35 gm of total fiber daily;Understanding of distribution of calorie intake throughout the day with the consumption of 4-5 meals/snacks    Improve shortness of breath with ADL's Yes    Intervention Provide education, individualized exercise plan and daily activity instruction to help decrease symptoms of SOB with activities of daily living.    Expected Outcomes Short Term: Improve cardiorespiratory fitness to achieve a reduction of symptoms when performing ADLs;Long Term: Be able to perform more ADLs without symptoms or delay the onset of symptoms    Hypertension Yes    Intervention Provide education on lifestyle modifcations including regular physical activity/exercise, weight management, moderate sodium restriction and increased consumption of fresh fruit, vegetables, and low fat dairy, alcohol moderation, and smoking cessation.;Monitor prescription use compliance.    Expected Outcomes Short Term: Continued assessment and intervention until BP is < 140/40mm HG in hypertensive participants. < 130/76mm HG in hypertensive participants with diabetes, heart failure or chronic kidney disease.;Long Term: Maintenance of blood pressure at goal levels.    Lipids Yes    Intervention Provide education and support for participant on nutrition & aerobic/resistive exercise along with prescribed medications to achieve LDL 70mg , HDL >40mg .    Expected Outcomes Short Term: Participant states understanding of desired cholesterol values and is compliant with medications prescribed. Participant is following exercise prescription and nutrition  guidelines.;Long Term: Cholesterol controlled with medications as prescribed, with individualized exercise RX and with personalized nutrition plan. Value goals: LDL < 70mg , HDL > 40 mg.             Education:Diabetes - Individual verbal and written instruction to review signs/symptoms of diabetes, desired ranges of glucose level fasting, after meals and with exercise. Acknowledge that pre and post exercise glucose checks will be done for 3 sessions at entry of program.   Know Your Numbers and Heart Failure: - Group verbal and visual instruction to discuss disease risk factors for cardiac and pulmonary  disease and treatment options.  Reviews associated critical values for Overweight/Obesity, Hypertension, Cholesterol, and Diabetes.  Discusses basics of heart failure: signs/symptoms and treatments.  Introduces Heart Failure Zone chart for action plan for heart failure.  Written material given at graduation.   Core Components/Risk Factors/Patient Goals Review:    Core Components/Risk Factors/Patient Goals at Discharge (Final Review):    ITP Comments:  ITP Comments     Row Name 11/08/22 0937 11/11/22 1322         ITP Comments Virtual Visit completed. Patient informed on EP and RD appointment and 6 Minute walk test. Patient also informed of patient health questionnaires on My Chart. Patient Verbalizes understanding. Visit diagnosis can be found in [facility] 10/14/[facility]22. Completed and gym orientation. Initial ITP created and sent for review to Dr. Jinny Sanders, Medical Director.               Comments: Initial ITP

## 2022-11-11 NOTE — Patient Instructions (Addendum)
Patient Instructions  Patient Details  Name: Larry Maxwell MRN: [id number] Date of Birth: Dec 14, 1965 Referring Provider:  Janann Colonel, MD  Below are your personal goals for exercise, nutrition, and risk factors. Our goal is to help you stay on track towards obtaining and maintaining these goals. We will be discussing your progress on these goals with you throughout the program.  Initial Exercise Prescription:  Initial Exercise Prescription - 11/11/22 1300       Date of Initial Exercise RX and Referring Provider   Date 11/11/22    Referring Provider [place]      Oxygen   Maintain Oxygen Saturation 88% or higher      Treadmill   MPH 1.8    Grade 0.5    Minutes 15    METs 2.5      NuStep   Level 2   T6 nustep   SPM 80    Minutes 15    METs 3.65      REL-XR   Level 3    Speed 50    Minutes 15    METs 3.65      Prescription Details   Frequency (times per week) 2    Duration Progress to 30 minutes of continuous aerobic without signs/symptoms of physical distress      Intensity   THRR 40-80% of Max Heartrate 112-146    Ratings of Perceived Exertion 11-13    Perceived Dyspnea 0-4      Progression   Progression Continue to progress workloads to maintain intensity without signs/symptoms of physical distress.      Resistance Training   Training Prescription Yes    Weight 4 lb    Reps 10-15             Exercise Goals: Frequency: Be able to perform aerobic exercise two to three times per week in program working toward 2-5 days per week of home exercise.  Intensity: Work with a perceived exertion of 11 (fairly light) - 15 (hard) while following your exercise prescription.  We will make changes to your prescription with you as you progress through the program.   Duration: Be able to do 30 to 45 minutes of continuous aerobic exercise in addition to a 5 minute warm-up and a 5 minute cool-down routine.   Nutrition Goals: Your personal nutrition  goals will be established when you do your nutrition analysis with the dietician.  The following are general nutrition guidelines to follow: Cholesterol < 200mg /day Sodium < 1500mg /day Fiber: Men over 50 yrs - 30 grams per day  Personal Goals:  Personal Goals and Risk Factors at Admission - 11/08/22 0939       Core Components/Risk Factors/Patient Goals on Admission    Weight Management Yes;Weight Maintenance    Intervention Weight Management: Develop a combined nutrition and exercise program designed to reach desired caloric intake, while maintaining appropriate intake of nutrient and fiber, sodium and fats, and appropriate energy expenditure required for the weight goal.;Weight Management: Provide education and appropriate resources to help participant work on and attain dietary goals.;Weight Management/Obesity: Establish reasonable short term and long term weight goals.    Expected Outcomes Short Term: Continue to assess and modify interventions until short term weight is achieved;Long Term: Adherence to nutrition and physical activity/exercise program aimed toward attainment of established weight goal;Weight Maintenance: Understanding of the daily nutrition guidelines, which includes 25-35% calories from fat, 7% or less cal from saturated fats, less than 200mg  cholesterol, less than 1.5gm of sodium, &  5 or more servings of fruits and vegetables daily;Understanding recommendations for meals to include 15-35% energy as protein, 25-35% energy from fat, 35-60% energy from carbohydrates, less than 200mg  of dietary cholesterol, 20-35 gm of total fiber daily;Understanding of distribution of calorie intake throughout the day with the consumption of 4-5 meals/snacks    Improve shortness of breath with ADL's Yes    Intervention Provide education, individualized exercise plan and daily activity instruction to help decrease symptoms of SOB with activities of daily living.    Expected Outcomes Short Term:  Improve cardiorespiratory fitness to achieve a reduction of symptoms when performing ADLs;Long Term: Be able to perform more ADLs without symptoms or delay the onset of symptoms    Hypertension Yes    Intervention Provide education on lifestyle modifcations including regular physical activity/exercise, weight management, moderate sodium restriction and increased consumption of fresh fruit, vegetables, and low fat dairy, alcohol moderation, and smoking cessation.;Monitor prescription use compliance.    Expected Outcomes Short Term: Continued assessment and intervention until BP is < 140/64mm HG in hypertensive participants. < 130/41mm HG in hypertensive participants with diabetes, heart failure or chronic kidney disease.;Long Term: Maintenance of blood pressure at goal levels.    Lipids Yes    Intervention Provide education and support for participant on nutrition & aerobic/resistive exercise along with prescribed medications to achieve LDL 70mg , HDL >40mg .    Expected Outcomes Short Term: Participant states understanding of desired cholesterol values and is compliant with medications prescribed. Participant is following exercise prescription and nutrition guidelines.;Long Term: Cholesterol controlled with medications as prescribed, with individualized exercise RX and with personalized nutrition plan. Value goals: LDL < 70mg , HDL > 40 mg.             Exercise Goals and Review:  Exercise Goals     Row Name 11/11/22 1332             Exercise Goals   Increase Physical Activity Yes       Intervention Provide advice, education, support and counseling about physical activity/exercise needs.;Develop an individualized exercise prescription for aerobic and resistive training based on initial evaluation findings, risk stratification, comorbidities and participant's personal goals.       Expected Outcomes Short Term: Attend rehab on a regular basis to increase amount of physical activity.;Long Term:  Add in home exercise to make exercise part of routine and to increase amount of physical activity.;Long Term: Exercising regularly at least 3-5 days a week.       Increase Strength and Stamina Yes       Intervention Provide advice, education, support and counseling about physical activity/exercise needs.;Develop an individualized exercise prescription for aerobic and resistive training based on initial evaluation findings, risk stratification, comorbidities and participant's personal goals.       Expected Outcomes Short Term: Increase workloads from initial exercise prescription for resistance, speed, and METs.;Short Term: Perform resistance training exercises routinely during rehab and add in resistance training at home;Long Term: Improve cardiorespiratory fitness, muscular endurance and strength as measured by increased METs and functional capacity ( )       Able to understand and use rate of perceived exertion (RPE) scale Yes       Intervention Provide education and explanation on how to use RPE scale       Expected Outcomes Short Term: Able to use RPE daily in rehab to express subjective intensity level;Long Term:  Able to use RPE to guide intensity level when exercising independently  Able to understand and use Dyspnea scale Yes       Intervention Provide education and explanation on how to use Dyspnea scale       Expected Outcomes Short Term: Able to use Dyspnea scale daily in rehab to express subjective sense of shortness of breath during exertion;Long Term: Able to use Dyspnea scale to guide intensity level when exercising independently       Knowledge and understanding of Target Heart Rate Range (THRR) Yes       Intervention Provide education and explanation of THRR including how the numbers were predicted and where they are located for reference       Expected Outcomes Short Term: Able to state/look up THRR;Long Term: Able to use THRR to govern intensity when exercising  independently;Short Term: Able to use daily as guideline for intensity in rehab       Able to check pulse independently Yes       Intervention Provide education and demonstration on how to check pulse in carotid and radial arteries.;Review the importance of being able to check your own pulse for safety during independent exercise       Expected Outcomes Short Term: Able to explain why pulse checking is important during independent exercise;Long Term: Able to check pulse independently and accurately       Understanding of Exercise Prescription Yes       Intervention Provide education, explanation, and written materials on patient's individual exercise prescription       Expected Outcomes Short Term: Able to explain program exercise prescription;Long Term: Able to explain home exercise prescription to exercise independently

## 2022-11-11 NOTE — Progress Notes (Signed)
  Care Coordination   Note   11/11/2022 Name: Larry Maxwell MRN: [id number] DOB: 1965-04-14  Larry Maxwell is a 57 y.o. year old male who sees Pardue, Monico Blitz, DO for primary care. I reached out to [name] by phone today to offer care coordination services.  [name] was given information about Care Coordination services today including:   The Care Coordination services include support from the care team which includes your Nurse Coordinator, Clinical Social Worker, or Pharmacist.  The Care Coordination team is here to help remove barriers to the health concerns and goals most important to you. Care Coordination services are voluntary, and the patient may decline or stop services at any time by request to their care team member.   Care Coordination Consent Status: Patient agreed to services and verbal consent obtained.   Follow up plan:  Telephone appointment with care coordination team member scheduled for:  11/22/2022  Encounter Outcome:  Patient Scheduled from referral   Burman Nieves, [facility] Care Coordination Care Guide Direct Dial: [phone]

## 2022-11-13 ENCOUNTER — Other Ambulatory Visit: Payer: Self-pay | Admitting: Family Medicine

## 2022-11-13 DIAGNOSIS — I10 Essential (primary) hypertension: Secondary | ICD-10-CM

## 2022-11-15 ENCOUNTER — Ambulatory Visit: Payer: 59 | Admitting: Family Medicine

## 2022-11-15 LAB — COLOGUARD: COLOGUARD: NEGATIVE

## 2022-11-15 NOTE — Telephone Encounter (Signed)
Requested Prescriptions  Pending Prescriptions Disp Refills   chlorthalidone (HYGROTON) 25 MG tablet [Pharmacy Med Name: CHLORTHALIDONE 25 MG TABLET] 90 tablet 0    Sig: TAKE 1 TABLET (25 MG TOTAL) BY MOUTH DAILY.     Cardiovascular: Diuretics - Thiazide Failed - 11/13/2022  9:21 AM      Failed - Cr in normal range and within 180 days    Creatinine, Ser  Date Value Ref Range Status  10/25/2022 1.29 (H) 0.61 - 1.24 mg/dL Final         Failed - Last BP in normal range    BP Readings from Last 1 Encounters:  11/04/22 (!) 162/114         Passed - K in normal range and within 180 days    Potassium  Date Value Ref Range Status  10/25/2022 3.7 3.5 - 5.1 mmol/L Final         Passed - Na in normal range and within 180 days    Sodium  Date Value Ref Range Status  10/25/2022 139 135 - 145 mmol/L Final  07/23/2022 135 134 - 144 mmol/L Final         Passed - Valid encounter within last 6 months    Recent Outpatient Visits           1 week ago Stage 4 very severe COPD by GOLD classification [facility])   [facility], Monico Blitz, DO   3 weeks ago Chest pain with high risk of acute coronary syndrome   [facility] [place], Monico Blitz, DO   2 months ago Restrictive lung disease   [facility] [place], Monico Blitz, DO   3 months ago Primary hypertension    [facility] P[facility], Monico Blitz, DO   3 months ago Gynecomastia, male   [facility], Monico Blitz, DO

## 2022-11-16 ENCOUNTER — Encounter: Payer: 59 | Admitting: *Deleted

## 2022-11-16 DIAGNOSIS — J449 Chronic obstructive pulmonary disease, unspecified: Secondary | ICD-10-CM

## 2022-11-16 DIAGNOSIS — J441 Chronic obstructive pulmonary disease with (acute) exacerbation: Secondary | ICD-10-CM | POA: Diagnosis not present

## 2022-11-16 NOTE — Progress Notes (Signed)
Assessment start time: 8:30 AM  Digestive issues/concerns: no known food allergies   24-hours Recall: B: 2 boiled egg, yogurt, coffee L: potato, roast beef, biscuits, D: fried chicken, white rice, 2 slices of white bread  Beverages water (64-75oz) Alcohol none Caffeine coffee  Supplements none Intake Patterns: he feels some days he over eats, some days doesn't eat much  Education r/t nutrition plan: Patient drinking 64oz of water daily. He wants to learn how to adopt a healthy eating style. Reviewed mediterranean diet handout with him, educating on types of fats, sources, and how to read labels. He is inconsistent with eating, sometimes overeating, or snacking on junk, sometimes missing meals. Explained the importance of consistency and structure. Built out several meals and snacks with foods he likes and will eat focusing on nutrient and calorie dense foods that could be eating in small quantities with little prep work to help him be more consistent at meeting nutritional needs even when his appetite is poor. Explained that this will require continued work, he has agreed to meet again in ~54month to reassess and work through any barriers that arise while making these changes.     Goal 1: Eat something balanced at every meal hour Goal 2: Include healthy fats to help promote health and meet calorie needs  End time 9:19 AM

## 2022-11-16 NOTE — Progress Notes (Signed)
Daily Session Note  Patient Details  Name: Larry Maxwell MRN: [id number] Date of Birth: 06-Jul-1965 Referring Provider:   Flowsheet Row Pulmonary Rehab from 11/11/2022 in [facility] Cardiac and Pulmonary Rehab  Referring Provider Janann Colonel       Encounter Date: 11/16/2022  Check In:  Session Check In - 11/16/22 0829       Check-In   Supervising physician immediately available to respond to emergencies See telemetry face sheet for immediately available ER MD    Location [facility]-Cardiac & Pulmonary Rehab    Staff Present Cora Collum, RN, BSN, CCRP;Margaret Best, MS, Exercise Physiologist;Noah Tickle, BS, Exercise Physiologist;Maxon Conetta BS, , Exercise Physiologist    Virtual Visit No    Medication changes reported     No    Fall or balance concerns reported    No    Warm-up and Cool-down Performed on first and last piece of equipment    Resistance Training Performed Yes    VAD Patient? No    PAD/SET Patient? No      Pain Assessment   Currently in Pain? No/denies                Social History   Tobacco Use  Smoking Status Former   Current packs/day: 0.00   Average packs/day: 1.5 packs/day for 20.0 years (30.0 ttl pk-yrs)   Types: Cigarettes   Start date: 12/19/2001   Quit date: 12/19/2021   Years since quitting: 0.9  Smokeless Tobacco Never    Goals Met:  Exercise tolerated well Personal goals reviewed No report of concerns or symptoms today  Goals Unmet:  Not Applicable  Comments: First full day of exercise!  Patient was oriented to gym and equipment including functions, settings, policies, and procedures.  Patient's individual exercise prescription and treatment plan were reviewed.  All starting workloads were established based on the results of the 6 minute walk test done at initial orientation visit.  The plan for exercise progression was also introduced and progression will be customized based on patient's performance and goals.    Dr. Bethann Punches is Medical Director for [facility]iac [facility].  Dr. Vida Rigger is Medical Director for [facility] Pulmonary Rehabilitation.

## 2022-11-18 ENCOUNTER — Encounter: Payer: 59 | Admitting: *Deleted

## 2022-11-18 DIAGNOSIS — J441 Chronic obstructive pulmonary disease with (acute) exacerbation: Secondary | ICD-10-CM | POA: Diagnosis not present

## 2022-11-18 DIAGNOSIS — J449 Chronic obstructive pulmonary disease, unspecified: Secondary | ICD-10-CM

## 2022-11-18 NOTE — Progress Notes (Signed)
Daily Session Note  Patient Details  Name: Larry Maxwell MRN: [id number] Date of Birth: Aug 01, 1965 Referring Provider:   Flowsheet Row Pulmonary Rehab from 11/11/2022 in [facility] Cardiac and Pulmonary Rehab  Referring Provider Janann Colonel       Encounter Date: 11/18/2022  Check In:  Session Check In - 11/18/22 0738       Check-In   Supervising physician immediately available to respond to emergencies See telemetry face sheet for immediately available ER MD    Location [facility]-Cardiac & Pulmonary Rehab    Staff Present Cora Collum, RN, BSN, CCRP;Joseph Hood, RCP,RRT,BSRT;Maxon Radnor BS, , Exercise Physiologist;Carmelo Reidel Katrinka Blazing, RN, [place]    Virtual Visit No    Medication changes reported     No    Fall or balance concerns reported    No    Warm-up and Cool-down Performed on first and last piece of equipment    Resistance Training Performed Yes    VAD Patient? No    PAD/SET Patient? No      Pain Assessment   Currently in Pain? No/denies                Social History   Tobacco Use  Smoking Status Former   Current packs/day: 0.00   Average packs/day: 1.5 packs/day for 20.0 years (30.0 ttl pk-yrs)   Types: Cigarettes   Start date: 12/19/2001   Quit date: 12/19/2021   Years since quitting: 0.9  Smokeless Tobacco Never    Goals Met:  Independence with exercise equipment Exercise tolerated well No report of concerns or symptoms today Strength training completed today  Goals Unmet:  Not Applicable  Comments: Pt able to follow exercise prescription today without complaint.  Will continue to monitor for progression.    Dr. Bethann Punches is Medical Director for [facility] Cardiac Rehabilitation.  Dr. Vida Rigger is Medical Director for [facility] Pulmonary Rehabilitation.

## 2022-11-22 ENCOUNTER — Telehealth: Payer: Self-pay | Admitting: Family Medicine

## 2022-11-22 ENCOUNTER — Ambulatory Visit: Payer: Self-pay | Admitting: *Deleted

## 2022-11-22 NOTE — Patient Instructions (Signed)
Visit Information  Thank you for taking time to visit with me today. Please don't hesitate to contact me if I can be of assistance to you.   Following are the goals we discussed today:   Goals Addressed             This Visit's Progress    mental health follow up and transporation options       Activities and task to complete in order to accomplish goals.   Please call [facility] to arrange transportation to medical appointments [phone]  EMOTIONAL / MENTAL HEALTH SUPPORT Keep all upcoming appointment discussed today Self Support options  (please continue to consider ongoing mental health therapy)         Our next appointment is by telephone on 11/29/22 at 4pm  Please call the care guide team at [phone] if you need to cancel or reschedule your appointment.   If you are experiencing a Mental Health or Behavioral Health Crisis or need someone to talk to, please call the Suicide and Crisis Lifeline: 988   Patient verbalizes understanding of instructions and care plan provided today and agrees to view in MyChart. Active MyChart status and patient understanding of how to access instructions and care plan via MyChart confirmed with patient.     Telephone follow up appointment with care management team member scheduled for: 11/29/22   Verna Czech, LCSW [place]  [facility], [facility] Licensed Clinical Social Worker Care Coordinator  Direct Dial: [phone]

## 2022-11-22 NOTE — Telephone Encounter (Signed)
Pt is calling to speak to [place], Clent Jacks, LCSW regarding medical transportation. Pt states that he was advised that he would hear something from DSS today. And he has not heard anything. But has an appt this week. And needs transportation. Please advise CB- [phone]

## 2022-11-22 NOTE — Patient Outreach (Signed)
  Care Coordination   Follow Up Visit Note   11/22/2022 Name: Larry Maxwell MRN: [id number] DOB: Jun 22, 1965  Larry Maxwell is a 57 y.o. year old male who sees Pardue, Monico Blitz, DO for primary care. I spoke with  [name] by phone today.  What matters to the patients health and wellness today?  Mental health follow up and transportation t medical appointments    Goals Addressed             This Visit's Progress    mental health follow up and transporation options       Activities and task to complete in order to accomplish goals.   Please call [facility] to arrange transportation to medical appointments [phone]  EMOTIONAL / MENTAL HEALTH SUPPORT Keep all upcoming appointment discussed today Self Support options  (please continue to consider ongoing mental health therapy)         SDOH assessments and interventions completed:  Yes  SDOH Interventions Today    Flowsheet Row Most Recent Value  SDOH Interventions   Food Insecurity Interventions Intervention Not Indicated  Housing Interventions Intervention Not Indicated  Transportation Interventions [facility] (Specialized Community Area Transporation)  Depression Interventions/Treatment  Referral to Psychiatry, Medication, Counseling        Care Coordination Interventions:  Yes, provided  Interventions Today    Flowsheet Row Most Recent Value  Chronic Disease   Chronic disease during today's visit Hypertension (HTN), Other  [depression]  General Interventions   General Interventions Discussed/Reviewed [employer], General Interventions Discussed, Communication with  [confirmed that pt was recently approved for Medicaid, currenlty in the process of applying for disability-working with an attorney to assist-resides with sister who assists with meals and daily care when needed]  Communication with --  [medicaid transportation contacted who confirmed that patient has managed  medicaid-(medicaid complete) and  will need to reach out to them to arrange transportation [facility] [phone]. Patient agreeable to cntacting [facility]]  Exercise Interventions   Exercise Discussed/Reviewed Exercise Discussed  [confirmed that patient has been referred to pulmonary rehab-will need assistance with transportation to participate]  Mental Health Interventions   Mental Health Discussed/Reviewed Mental Health Discussed, Coping Strategies, Depression  [confrmed symptoms od depression related to his medical condition and recent break-up, encouraged verbalization of feeling and use of postive coping strategies-self care emphasized-pt agrreable to ongoing mental health counseling]  Safety Interventions   Safety Discussed/Reviewed Safety Discussed  [patient denies thoughts of harm to self or others-encouraged patient to call 988 in the event of a mental health crisis]       Follow up plan: Follow up call scheduled for 11/29/22    Encounter Outcome:  Patient Visit Completed

## 2022-11-23 ENCOUNTER — Telehealth: Payer: Self-pay | Admitting: *Deleted

## 2022-11-23 ENCOUNTER — Other Ambulatory Visit: Payer: Self-pay | Admitting: Family Medicine

## 2022-11-23 ENCOUNTER — Telehealth: Payer: Self-pay

## 2022-11-23 DIAGNOSIS — I10 Essential (primary) hypertension: Secondary | ICD-10-CM

## 2022-11-23 NOTE — Patient Instructions (Signed)
Visit Information  Thank you for taking time to visit with me today. Please don't hesitate to contact me if I can be of assistance to you.   Following are the goals we discussed today:   Goals Addressed             This Visit's Progress    mental health follow up and transporation options       Activities and task to complete in order to accomplish goals.   Please continue to follow up with [facility] to arrange transportation to medical appointments [phone]  EMOTIONAL / MENTAL HEALTH SUPPORT Please anticipate call from the managed medicaid team for ongoing care management follow up Self Support options  (please continue to consider ongoing mental health therapy)        If you are experiencing a Mental Health or Behavioral Health Crisis or need someone to talk to, please call the Suicide and Crisis Lifeline: 988   Patient verbalizes understanding of instructions and care plan provided today and agrees to view in MyChart. Active MyChart status and patient understanding of how to access instructions and care plan via MyChart confirmed with patient.     Patient to be scheduled with the managed medicaid team   [place], LCSW [place]  [facility], [facility] Licensed Clinical Social Worker Care Coordinator  Direct Dial: [phone]

## 2022-11-23 NOTE — Patient Outreach (Signed)
  Care Coordination   Follow Up Visit Note   11/23/2022 Name: Larry Maxwell MRN: [id number] DOB: 12/19/1965  Larry Maxwell is a 57 y.o. year old male who sees Pardue, Monico Blitz, DO for primary care. I spoke with  Larry Maxwell by phone today.  What matters to the patients health and wellness today?  Patient to transition to the managed medicaid team for ongoing care coordination and mental health support as needed.    Goals Addressed             This Visit's Progress    mental health follow up and transporation options       Activities and task to complete in order to accomplish goals.   Please continue to follow up with [facility] to arrange transportation to medical appointments [phone]  EMOTIONAL / MENTAL HEALTH SUPPORT Please anticipate call from the managed medicaid team for ongoing care management follow up Self Support options  (please continue to consider ongoing mental health therapy)         SDOH assessments and interventions completed:  No     Care Coordination Interventions:  Yes, provided  Interventions Today    Flowsheet Row Most Recent Value  Chronic Disease   Chronic disease during today's visit Hypertension (HTN), Other  [depression]  General Interventions   General Interventions Discussed/Reviewed General Interventions Reviewed, [employer], Communication with  [confirmed with pt has contacted [facility] transportation to arranged transp to medical appts. [phone]  Communication with --  [careguide to confirm that patient is now covered under managed medicaid-discuss plan to transition pt the manage medicaid team-pt notified of transition]       Follow up plan:  Patient to be scheduled with the managed medicaid team     Encounter Outcome:  Patient Visit Completed

## 2022-11-23 NOTE — Telephone Encounter (Signed)
Copied from CRM [id number]. Topic: General - Other >> Nov 22, 2022  4:40 PM Santiya F wrote: Reason for CRM: Pt is calling in returning a call from [place]. Please follow up with pt.

## 2022-11-24 ENCOUNTER — Other Ambulatory Visit: Payer: Self-pay | Admitting: Family Medicine

## 2022-11-24 ENCOUNTER — Encounter: Payer: Self-pay | Admitting: *Deleted

## 2022-11-24 DIAGNOSIS — J449 Chronic obstructive pulmonary disease, unspecified: Secondary | ICD-10-CM

## 2022-11-24 DIAGNOSIS — G8929 Other chronic pain: Secondary | ICD-10-CM

## 2022-11-24 DIAGNOSIS — I10 Essential (primary) hypertension: Secondary | ICD-10-CM

## 2022-11-24 DIAGNOSIS — R202 Paresthesia of skin: Secondary | ICD-10-CM

## 2022-11-24 DIAGNOSIS — R053 Chronic cough: Secondary | ICD-10-CM

## 2022-11-24 NOTE — Telephone Encounter (Signed)
Refusing Valsartan 40 mg because pt has a follow up appt. On 12/03/2022.   The 30 rx is enough to last until Nov. 9, 2024.  This is a new rx just started.    Labs are all in date.

## 2022-11-24 NOTE — Progress Notes (Signed)
30 Day review completed. Medical Director ITP review done, changes made as directed, and signed approval by Medical Director.  

## 2022-11-24 NOTE — Progress Notes (Signed)
Pulmonary Individual Treatment Plan  Patient Details  Name: Larry Maxwell MRN: [id number] Date of Birth: [birth year] Referring Provider:   Flowsheet Row Pulmonary Rehab from 11/11/2022 in [facility] Cardiac and Pulmonary Rehab  Referring Provider Jean-Pierre Assaker       Initial Encounter Date:  Flowsheet Row Pulmonary Rehab from 11/11/2022 in [facility] Cardiac and Pulmonary Rehab  Date 11/11/22       Visit Diagnosis: Chronic obstructive pulmonary disease, unspecified COPD type (HCC)  Patient's Home Medications on Admission:  Current Outpatient Medications:    albuterol (VENTOLIN HFA) 108 (90 Base) MCG/ACT inhaler, Inhale 2 puffs into the lungs every 6 (six) hours as needed for wheezing or shortness of breath., Disp: 8 g, Rfl: 0   amLODipine (NORVASC) 10 MG tablet, TAKE 1 TABLET (10 MG TOTAL) BY MOUTH DAILY FOR 30 DOSES, Disp: 30 tablet, Rfl: 2   benzonatate (TESSALON) 100 MG capsule, Take 1 capsule (100 mg total) by mouth 2 (two) times daily as needed for cough., Disp: 20 capsule, Rfl: 0   celecoxib (CELEBREX) 100 MG capsule, Take 1 capsule (100 mg total) by mouth 2 (two) times daily., Disp: 60 capsule, Rfl: 2   cetirizine (ZYRTEC) 10 MG tablet, Take 1 tablet (10 mg total) by mouth daily., Disp: 30 tablet, Rfl: 11   chlorthalidone (HYGROTON) 25 MG tablet, TAKE 1 TABLET (25 MG TOTAL) BY MOUTH DAILY., Disp: 90 tablet, Rfl: 0   gabapentin (NEURONTIN) 300 MG capsule, Take 1 tab morning, 1 tab at midday, and 2 tabs before bedtime, Disp: 120 capsule, Rfl: 0   ipratropium-albuterol (DUONEB) 0.5-2.5 (3) MG/3ML SOLN, Take 3 mLs by nebulization every 6 (six) hours as needed., Disp: 360 mL, Rfl: 3   methocarbamol (ROBAXIN) 500 MG tablet, Take 500 mg by mouth 4 (four) times daily., Disp: , Rfl:    metoprolol succinate (TOPROL-XL) 25 MG 24 hr tablet, Take 1 tablet (25 mg total) by mouth daily., Disp: 90 tablet, Rfl: 0   montelukast (SINGULAIR) 10 MG tablet, Take 1 tablet (10 mg total) by mouth at  bedtime., Disp: 30 tablet, Rfl: 0   rosuvastatin (CRESTOR) 10 MG tablet, Take 1 tablet (10 mg total) by mouth daily., Disp: 90 tablet, Rfl: 3   sertraline (ZOLOFT) 50 MG tablet, Take 1 tablet (50 mg total) by mouth daily., Disp: 30 tablet, Rfl: 3   valsartan (DIOVAN) 40 MG tablet, Take 1 tablet (40 mg total) by mouth daily. Start taking after one week, if top BP number is >125, Disp: 30 tablet, Rfl: 0  Past Medical History: Past Medical History:  Diagnosis Date   Allergy    Asthma    Depression    Gynecomastia, male 07/21/2022   Hypertension     Tobacco Use: Social History   Tobacco Use  Smoking Status Former   Current packs/day: 0.00   Average packs/day: 1.5 packs/day for 20.0 years (30.0 ttl pk-yrs)   Types: Cigarettes   Start date: 12/19/2001   Quit date: 12/19/2021   Years since quitting: 0.9  Smokeless Tobacco Never    Labs: Review Flowsheet       Latest Ref Rng & Units 07/23/2022  Labs for ITP Cardiac and Pulmonary Rehab  Cholestrol 100 - 199 mg/dL 409   LDL (calc) 0 - 99 mg/dL 811   HDL-C >91 mg/dL 47   Trlycerides 0 - 478 mg/dL 295     Details             Pulmonary Assessment Scores:  Pulmonary Assessment Scores  Row Name 11/11/22 1325         ADL UCSD   ADL Phase Entry     SOB Score total 53     Rest 4     Walk 3     Stairs 3     Bath 4     Dress 2     Shop 0       CAT Score   CAT Score 31       mMRC Score   mMRC Score 2              UCSD: Self-administered rating of dyspnea associated with activities of daily living (ADLs) 6-point scale (0 = "not at all" to 5 = "maximal or unable to do because of breathlessness")  Scoring Scores range from 0 to 120.  Minimally important difference is 5 units  CAT: CAT can identify the health impairment of COPD patients and is better correlated with disease progression.  CAT has a scoring range of zero to 40. The CAT score is classified into four groups of low (less than 10), medium (10 -  20), high (21-30) and very high (31-40) based on the impact level of disease on health status. A CAT score over 10 suggests significant symptoms.  A worsening CAT score could be explained by an exacerbation, poor medication adherence, poor inhaler technique, or progression of COPD or comorbid conditions.  CAT MCID is 2 points  mMRC: mMRC (Modified Medical Research Council) Dyspnea Scale is used to assess the degree of baseline functional disability in patients of respiratory disease due to dyspnea. No minimal important difference is established. A decrease in score of 1 point or greater is considered a positive change.   Pulmonary Function Assessment:  Pulmonary Function Assessment - 11/08/22 0938       Breath   Shortness of Breath Yes             Exercise Target Goals: Exercise Program Goal: Individual exercise prescription set using results from initial 6 min walk test and THRR while considering  patient's activity barriers and safety.   Exercise Prescription Goal: Initial exercise prescription builds to 30-45 minutes a day of aerobic activity, 2-3 days per week.  Home exercise guidelines will be given to patient during program as part of exercise prescription that the participant will acknowledge.  Education: Aerobic Exercise: - Group verbal and visual presentation on the components of exercise prescription. Introduces F.I.T.T principle from ACSM for exercise prescriptions.  Reviews F.I.T.T. principles of aerobic exercise including progression. Written material given at graduation.   Education: Resistance Exercise: - Group verbal and visual presentation on the components of exercise prescription. Introduces F.I.T.T principle from ACSM for exercise prescriptions  Reviews F.I.T.T. principles of resistance exercise including progression. Written material given at graduation. Flowsheet Row Pulmonary Rehab from 11/18/2022 in [facility] Cardiac and Pulmonary Rehab  Date 11/18/22  Educator  MB  Instruction Review Code 1- [employer] Understanding        Education: Exercise & Equipment Safety: - Individual verbal instruction and demonstration of equipment use and safety with use of the equipment. Flowsheet Row Pulmonary Rehab from 11/18/2022 in [facility] Cardiac and Pulmonary Rehab  Date 11/11/22  Educator NT  Instruction Review Code 1- Verbalizes Understanding       Education: Exercise Physiology & General Exercise Guidelines: - Group verbal and written instruction with models to review the exercise physiology of the cardiovascular system and associated critical values. Provides general exercise guidelines with specific guidelines to those  with heart or lung disease.    Education: Flexibility, Balance, Mind/Body Relaxation: - Group verbal and visual presentation with interactive activity on the components of exercise prescription. Introduces F.I.T.T principle from ACSM for exercise prescriptions. Reviews F.I.T.T. principles of flexibility and balance exercise training including progression. Also discusses the mind body connection.  Reviews various relaxation techniques to help reduce and manage stress (i.e. Deep breathing, progressive muscle relaxation, and visualization). Balance handout provided to take home. Written material given at graduation. Flowsheet Row Pulmonary Rehab from 11/18/2022 in [facility] Cardiac and Pulmonary Rehab  Date 11/18/22  Educator MB  Instruction Review Code 1- Verbalizes Understanding       Activity Barriers & Risk Stratification:  Activity Barriers & Cardiac Risk Stratification - 11/11/22 1332       Activity Barriers & Cardiac Risk Stratification   Activity Barriers Back Problems;Shortness of Breath;Other (comment)    Comments Sciatica L nerve             6 Minute Walk:  6 Minute Walk     Row Name 11/11/22 1329         6 Minute Walk   Phase Initial     Distance 1030 feet     Walk Time 6 minutes     # of Rest Breaks 0     MPH  1.95     METS 3.65     RPE 12     Perceived Dyspnea  2     VO2 Peak 12.78     Symptoms Yes (comment)     Comments cough, chest toghtness/pain 7/10     Resting HR 78 bpm     Resting BP 124/78     Resting Oxygen Saturation  98 %     Exercise Oxygen Saturation  during 6 min walk 94 %     Max Ex. HR 107 bpm     Max Ex. BP 148/84     2 Minute Post BP 128/76       Interval HR   1 Minute HR 107     2 Minute HR 100     3 Minute HR 103     4 Minute HR 99     5 Minute HR 103     6 Minute HR 96     2 Minute Post HR 87     Interval Heart Rate? Yes       Interval Oxygen   Interval Oxygen? Yes     Baseline Oxygen Saturation % 98 %     1 Minute Oxygen Saturation % 98 %     1 Minute Liters of Oxygen 0 L  RA     2 Minute Oxygen Saturation % 99 %     2 Minute Liters of Oxygen 0 L     3 Minute Oxygen Saturation % 98 %     3 Minute Liters of Oxygen 0 L     4 Minute Oxygen Saturation % 94 %     4 Minute Liters of Oxygen 0 L     5 Minute Oxygen Saturation % 97 %     5 Minute Liters of Oxygen 0 L     6 Minute Oxygen Saturation % 98 %     6 Minute Liters of Oxygen 0 L     2 Minute Post Oxygen Saturation % 99 %     2 Minute Post Liters of Oxygen 0 L  Oxygen Initial Assessment:  Oxygen Initial Assessment - 11/08/22 0938       Home Oxygen   Home Oxygen Device None    Sleep Oxygen Prescription None    Home Exercise Oxygen Prescription None    Home Resting Oxygen Prescription None      Initial 6 min Walk   Oxygen Used None      Program Oxygen Prescription   Program Oxygen Prescription None      Intervention   Short Term Goals To learn and understand importance of maintaining oxygen saturations>88%;To learn and understand importance of monitoring SPO2 with pulse oximeter and demonstrate accurate use of the pulse oximeter.;To learn and demonstrate proper pursed lip breathing techniques or other breathing techniques. ;To learn and demonstrate proper use of respiratory  medications    Long  Term Goals Verbalizes importance of monitoring SPO2 with pulse oximeter and return demonstration;Maintenance of O2 saturations>88%;Exhibits proper breathing techniques, such as pursed lip breathing or other method taught during program session;Compliance with respiratory medication;Demonstrates proper use of MDI's             Oxygen Re-Evaluation:  Oxygen Re-Evaluation     Row Name 11/16/22 0832             Program Oxygen Prescription   Program Oxygen Prescription None         Home Oxygen   Home Oxygen Device None       Sleep Oxygen Prescription None       Home Exercise Oxygen Prescription None       Home Resting Oxygen Prescription None       Compliance with Home Oxygen Use Yes         Goals/Expected Outcomes   Short Term Goals To learn and demonstrate proper pursed lip breathing techniques or other breathing techniques.        Long  Term Goals Exhibits proper breathing techniques, such as pursed lip breathing or other method taught during program session       Comments Reviewed PLB technique with pt.  Talked about how it works and it's importance in maintaining their exercise saturations.       Goals/Expected Outcomes Short: Become more profiecient at using PLB. Long: Become independent at using PLB.                Oxygen Discharge (Final Oxygen Re-Evaluation):  Oxygen Re-Evaluation - 11/16/22 0832       Program Oxygen Prescription   Program Oxygen Prescription None      Home Oxygen   Home Oxygen Device None    Sleep Oxygen Prescription None    Home Exercise Oxygen Prescription None    Home Resting Oxygen Prescription None    Compliance with Home Oxygen Use Yes      Goals/Expected Outcomes   Short Term Goals To learn and demonstrate proper pursed lip breathing techniques or other breathing techniques.     Long  Term Goals Exhibits proper breathing techniques, such as pursed lip breathing or other method taught during program session     Comments Reviewed PLB technique with pt.  Talked about how it works and it's importance in maintaining their exercise saturations.    Goals/Expected Outcomes Short: Become more profiecient at using PLB. Long: Become independent at using PLB.             Initial Exercise Prescription:  Initial Exercise Prescription - 11/11/22 1300       Date of Initial Exercise RX and Referring Provider  Date 11/11/22    Referring Provider [place] Assaker      Oxygen   Maintain Oxygen Saturation 88% or higher      Treadmill   MPH 1.8    Grade 0.5    Minutes 15    METs 2.5      NuStep   Level 2   T6 nustep   SPM 80    Minutes 15    METs 3.65      REL-XR   Level 3    Speed 50    Minutes 15    METs 3.65      Prescription Details   Frequency (times per week) 2    Duration Progress to 30 minutes of continuous aerobic without signs/symptoms of physical distress      Intensity   THRR 40-80% of Max Heartrate 112-146    Ratings of Perceived Exertion 11-13    Perceived Dyspnea 0-4      Progression   Progression Continue to progress workloads to maintain intensity without signs/symptoms of physical distress.      Resistance Training   Training Prescription Yes    Weight 4 lb    Reps 10-15             Perform Capillary Blood Glucose checks as needed.  Exercise Prescription Changes:   Exercise Prescription Changes     Row Name 11/11/22 1300             Response to Exercise   Blood Pressure (Admit) 124/78       Blood Pressure (Exercise) 148/84       Blood Pressure (Exit) 128/76       Heart Rate (Admit) 78 bpm       Heart Rate (Exercise) 107 bpm       Heart Rate (Exit) 87 bpm       Oxygen Saturation (Admit) 98 %       Oxygen Saturation (Exercise) 94 %       Oxygen Saturation (Exit) 99 %       Rating of Perceived Exertion (Exercise) 12       Perceived Dyspnea (Exercise) 2       Symptoms cogh, chest tightness 7/10       Comments Results                 Exercise Comments:   Exercise Comments     Row Name 11/16/22 0831           Exercise Comments First full day of exercise!  Patient was oriented to gym and equipment including functions, settings, policies, and procedures.  Patient's individual exercise prescription and treatment plan were reviewed.  All starting workloads were established based on the results of the 6 minute walk test done at initial orientation visit.  The plan for exercise progression was also introduced and progression will be customized based on patient's performance and goals.                Exercise Goals and Review:   Exercise Goals     Row Name 11/11/22 1332             Exercise Goals   Increase Physical Activity Yes       Intervention Provide advice, education, support and counseling about physical activity/exercise needs.;Develop an individualized exercise prescription for aerobic and resistive training based on initial evaluation findings, risk stratification, comorbidities and participant's personal goals.       Expected Outcomes Short Term: Attend  rehab on a regular basis to increase amount of physical activity.;Long Term: Add in home exercise to make exercise part of routine and to increase amount of physical activity.;Long Term: Exercising regularly at least 3-5 days a week.       Increase Strength and Stamina Yes       Intervention Provide advice, education, support and counseling about physical activity/exercise needs.;Develop an individualized exercise prescription for aerobic and resistive training based on initial evaluation findings, risk stratification, comorbidities and participant's personal goals.       Expected Outcomes Short Term: Increase workloads from initial exercise prescription for resistance, speed, and METs.;Short Term: Perform resistance training exercises routinely during rehab and add in resistance training at home;Long Term: Improve cardiorespiratory fitness, muscular  endurance and strength as measured by increased METs and functional capacity ( )       Able to understand and use rate of perceived exertion (RPE) scale Yes       Intervention Provide education and explanation on how to use RPE scale       Expected Outcomes Short Term: Able to use RPE daily in rehab to express subjective intensity level;Long Term:  Able to use RPE to guide intensity level when exercising independently       Able to understand and use Dyspnea scale Yes       Intervention Provide education and explanation on how to use Dyspnea scale       Expected Outcomes Short Term: Able to use Dyspnea scale daily in rehab to express subjective sense of shortness of breath during exertion;Long Term: Able to use Dyspnea scale to guide intensity level when exercising independently       Knowledge and understanding of Target Heart Rate Range (THRR) Yes       Intervention Provide education and explanation of THRR including how the numbers were predicted and where they are located for reference       Expected Outcomes Short Term: Able to state/look up THRR;Long Term: Able to use THRR to govern intensity when exercising independently;Short Term: Able to use daily as guideline for intensity in rehab       Able to check pulse independently Yes       Intervention Provide education and demonstration on how to check pulse in carotid and radial arteries.;Review the importance of being able to check your own pulse for safety during independent exercise       Expected Outcomes Short Term: Able to explain why pulse checking is important during independent exercise;Long Term: Able to check pulse independently and accurately       Understanding of Exercise Prescription Yes       Intervention Provide education, explanation, and written materials on patient's individual exercise prescription       Expected Outcomes Short Term: Able to explain program exercise prescription;Long Term: Able to explain home exercise  prescription to exercise independently                Exercise Goals Re-Evaluation :  Exercise Goals Re-Evaluation     Row Name 11/16/22 0831             Exercise Goal Re-Evaluation   Comments Reviewed RPE and dyspnea scale, THR and program prescription with pt today.  Pt voiced understanding and was given a copy of goals to take home.       Expected Outcomes Short: Use RPE daily to regulate intensity. Long: Follow program prescription in THR.  Discharge Exercise Prescription (Final Exercise Prescription Changes):  Exercise Prescription Changes - 11/11/22 1300       Response to Exercise   Blood Pressure (Admit) 124/78    Blood Pressure (Exercise) 148/84    Blood Pressure (Exit) 128/76    Heart Rate (Admit) 78 bpm    Heart Rate (Exercise) 107 bpm    Heart Rate (Exit) 87 bpm    Oxygen Saturation (Admit) 98 %    Oxygen Saturation (Exercise) 94 %    Oxygen Saturation (Exit) 99 %    Rating of Perceived Exertion (Exercise) 12    Perceived Dyspnea (Exercise) 2    Symptoms cogh, chest tightness 7/10    Comments Results             Nutrition:  Target Goals: Understanding of nutrition guidelines, daily intake of sodium 1500mg , cholesterol 200mg , calories 30% from fat and 7% or less from saturated fats, daily to have 5 or more servings of fruits and vegetables.  Education: All About Nutrition: -Group instruction provided by verbal, written material, interactive activities, discussions, models, and posters to present general guidelines for heart healthy nutrition including fat, fiber, MyPlate, the role of sodium in heart healthy nutrition, utilization of the nutrition label, and utilization of this knowledge for meal planning. Follow up email sent as well. Written material given at graduation. Flowsheet Row Pulmonary Rehab from 11/18/2022 in [facility] Cardiac and Pulmonary Rehab  Education need identified 11/11/22       Biometrics:  Pre Biometrics -  11/11/22 1333       Pre Biometrics   Height 6\' 1"  (1.854 m)    Weight 190 lb 8 oz (86.4 kg)    Waist Circumference 40 inches    Hip Circumference 41 inches    Waist to Hip Ratio 0.98 %    BMI (Calculated) 25.14    Single Leg Stand 10.4 seconds              Nutrition Therapy Plan and Nutrition Goals:  Nutrition Therapy & Goals - 11/16/22 0947       Nutrition Therapy   Diet Mediterranean    Protein (specify units) 75-90    Fiber 30 grams    Whole Grain Foods 3 servings    Saturated Fats 15 max. grams    Fruits and Vegetables 5 servings/day    Sodium 2 grams      Personal Nutrition Goals   Nutrition Goal Eat something balanced at every meal hour    Personal Goal #2 Include healthy fats to help promote health and meet calorie needs    Personal Goal #3 Meet with RD again in late Nov    Comments Patient drinking 64oz of water daily. He wants to learn how to adopt a healthy eating style. Reviewed mediterranean diet handout with him, educating on types of fats, sources, and how to read labels. He is inconsistent with eating, sometimes overeating, or snacking on junk, sometimes missing meals. Explained the importance of consistency and structure. Built out several meals and snacks with foods he likes and will eat focusing on nutrient and calorie dense foods that could be eating in small quantities with little prep work to help him be more consistent at meeting nutritional needs even when his appetite is poor. Explained that this will require continued work, he has agreed to meet again in ~51month to reassess and work through any barriers that arise while making these changes.      Intervention Plan   Intervention  Prescribe, educate and counsel regarding individualized specific dietary modifications aiming towards targeted core components such as weight, hypertension, lipid management, diabetes, heart failure and other comorbidities.;Nutrition handout(s) given to patient.    Expected  Outcomes Short Term Goal: Understand basic principles of dietary content, such as calories, fat, sodium, cholesterol and nutrients.;Short Term Goal: A plan has been developed with personal nutrition goals set during dietitian appointment.;Long Term Goal: Adherence to prescribed nutrition plan.             Nutrition Assessments:  MEDIFICTS Score Key: >=70 Need to make dietary changes  40-70 Heart Healthy Diet <= 40 Therapeutic Level Cholesterol Diet  Flowsheet Row Pulmonary Rehab from 11/11/2022 in [facility] Cardiac and Pulmonary Rehab  Picture Your Plate Total Score on Admission 41      Picture Your Plate Scores: <40 Unhealthy dietary pattern with much room for improvement. 41-50 Dietary pattern unlikely to meet recommendations for good health and room for improvement. 51-60 More healthful dietary pattern, with some room for improvement.  >60 Healthy dietary pattern, although there may be some specific behaviors that could be improved.   Nutrition Goals Re-Evaluation:   Nutrition Goals Discharge (Final Nutrition Goals Re-Evaluation):   Psychosocial: Target Goals: Acknowledge presence or absence of significant depression and/or stress, maximize coping skills, provide positive support system. Participant is able to verbalize types and ability to use techniques and skills needed for reducing stress and depression.   Education: Stress, Anxiety, and Depression - Group verbal and visual presentation to define topics covered.  Reviews how body is impacted by stress, anxiety, and depression.  Also discusses healthy ways to reduce stress and to treat/manage anxiety and depression.  Written material given at graduation.   Education: Sleep Hygiene -Provides group verbal and written instruction about how sleep can affect your health.  Define sleep hygiene, discuss sleep cycles and impact of sleep habits. Review good sleep hygiene tips.    Initial Review & Psychosocial Screening:  Initial  Psych Review & Screening - 11/08/22 0940       Initial Review   Current issues with Current Sleep Concerns;Current Anxiety/Panic      Family Dynamics   Good Support System? No    Strains Intra-family strains    Comments He states he does not have a great family support system. He preferes to deal with his stressors himself. At night he has trouble sleeping and gets shot of breath.      Barriers   Psychosocial barriers to participate in program The patient should benefit from training in stress management and relaxation.;There are no identifiable barriers or psychosocial needs.      Screening Interventions   Interventions Encouraged to exercise;To provide support and resources with identified psychosocial needs;Provide feedback about the scores to participant    Expected Outcomes Short Term goal: Utilizing psychosocial counselor, staff and physician to assist with identification of specific Stressors or current issues interfering with healing process. Setting desired goal for each stressor or current issue identified.;Long Term Goal: Stressors or current issues are controlled or eliminated.;Short Term goal: Identification and review with participant of any Quality of Life or Depression concerns found by scoring the questionnaire.;Long Term goal: The participant improves quality of Life and PHQ9 Scores as seen by post scores and/or verbalization of changes             Quality of Life Scores:  Scores of 19 and below usually indicate a poorer quality of life in these areas.  A difference of  2-3  points is a clinically meaningful difference.  A difference of 2-3 points in the total score of the Quality of Life Index has been associated with significant improvement in overall quality of life, self-image, physical symptoms, and general health in studies assessing change in quality of life.  PHQ-9: Review Flowsheet  More data exists      11/22/2022 11/11/2022 11/04/2022 08/25/2022 08/17/2022   Depression screen PHQ 2/9  Decreased Interest 2 2 1 2 2   Down, Depressed, Hopeless 3 3 1 3 2   PHQ - 2 Score 5 5 2 5 4   Altered sleeping 3 3 3 2 3   Tired, decreased energy 1 2 3 2 2   Change in appetite 0 1 2 2 2   Feeling bad or failure about yourself  0 1 2 2 2   Trouble concentrating - 0 2 2 1   Moving slowly or fidgety/restless 0 1 2 1 1   Suicidal thoughts 1 1 1 2 1   PHQ-9 Score 10 14 17 18 16   Difficult doing work/chores - Somewhat difficult - Not difficult at all Somewhat difficult    Details           Interpretation of Total Score  Total Score Depression Severity:  1-4 = Minimal depression, 5-9 = Mild depression, 10-14 = Moderate depression, 15-19 = Moderately severe depression, 20-27 = Severe depression   Psychosocial Evaluation and Intervention:  Psychosocial Evaluation - 11/08/22 0945       Psychosocial Evaluation & Interventions   Interventions Encouraged to exercise with the program and follow exercise prescription;Relaxation education;Stress management education    Comments He states he does not have a great family support system. He preferes to deal with his stressors himself. At night he has trouble sleeping and gets shot of breath.    Expected Outcomes Short: Start [facility] to help with mood. Long: Maintain a healthy mental state.    Continue Psychosocial Services  Follow up required by staff             Psychosocial Re-Evaluation:   Psychosocial Discharge (Final Psychosocial Re-Evaluation):   Education: Education Goals: Education classes will be provided on a weekly basis, covering required topics. Participant will state understanding/return demonstration of topics presented.  Learning Barriers/Preferences:  Learning Barriers/Preferences - 11/08/22 0939       Learning Barriers/Preferences   Learning Barriers None    Learning Preferences None             General Pulmonary Education Topics:  Infection Prevention: - Provides verbal and  written material to individual with discussion of infection control including proper hand washing and proper equipment cleaning during exercise session. Flowsheet Row Pulmonary Rehab from 11/18/2022 in [facility] Cardiac and Pulmonary Rehab  Date 11/11/22  Educator NT  Instruction Review Code 1- Verbalizes Understanding       Falls Prevention: - Provides verbal and written material to individual with discussion of falls prevention and safety. Flowsheet Row Pulmonary Rehab from 11/18/2022 in [facility] Cardiac and Pulmonary Rehab  Date 11/11/22  Educator NT  Instruction Review Code 1- Verbalizes Understanding       Chronic Lung Disease Review: - Group verbal instruction with posters, models, PowerPoint presentations and videos,  to review new updates, new respiratory medications, new advancements in procedures and treatments. Providing information on websites and "800" numbers for continued self-education. Includes information about supplement oxygen, available portable oxygen systems, continuous and intermittent flow rates, oxygen safety, concentrators, and Medicare reimbursement for oxygen. Explanation of Pulmonary Drugs, including class, frequency, complications, importance of  spacers, rinsing mouth after steroid MDI's, and proper cleaning methods for nebulizers. Review of basic lung anatomy and physiology related to function, structure, and complications of lung disease. Review of risk factors. Discussion about methods for diagnosing sleep apnea and types of masks and machines for OSA. Includes a review of the use of types of environmental controls: home humidity, furnaces, filters, dust mite/pet prevention, HEPA vacuums. Discussion about weather changes, air quality and the benefits of nasal washing. Instruction on Warning signs, infection symptoms, calling MD promptly, preventive modes, and value of vaccinations. Review of effective airway clearance, coughing and/or vibration techniques. Emphasizing  that all should Create an Action Plan. Written material given at graduation. Flowsheet Row Pulmonary Rehab from 11/18/2022 in [facility] Cardiac and Pulmonary Rehab  Education need identified 11/11/22       AED/CPR: - Group verbal and written instruction with the use of models to demonstrate the basic use of the AED with the basic ABC's of resuscitation.    Anatomy and Cardiac Procedures: - Group verbal and visual presentation and models provide information about basic cardiac anatomy and function. Reviews the testing methods done to diagnose heart disease and the outcomes of the test results. Describes the treatment choices: Medical Management, Angioplasty, or Coronary Bypass Surgery for treating various heart conditions including Myocardial Infarction, Angina, Valve Disease, and Cardiac Arrhythmias.  Written material given at graduation.   Medication Safety: - Group verbal and visual instruction to review commonly prescribed medications for heart and lung disease. Reviews the medication, class of the drug, and side effects. Includes the steps to properly store meds and maintain the prescription regimen.  Written material given at graduation.   Other: -Provides group and verbal instruction on various topics (see comments)   Knowledge Questionnaire Score:  Knowledge Questionnaire Score - 11/11/22 1324       Knowledge Questionnaire Score   Pre Score 15/18              Core Components/Risk Factors/Patient Goals at Admission:  Personal Goals and Risk Factors at Admission - 11/08/22 0939       Core Components/Risk Factors/Patient Goals on Admission    Weight Management Yes;Weight Maintenance    Intervention Weight Management: Develop a combined nutrition and exercise program designed to reach desired caloric intake, while maintaining appropriate intake of nutrient and fiber, sodium and fats, and appropriate energy expenditure required for the weight goal.;Weight Management: Provide  education and appropriate resources to help participant work on and attain dietary goals.;Weight Management/Obesity: Establish reasonable short term and long term weight goals.    Expected Outcomes Short Term: Continue to assess and modify interventions until short term weight is achieved;Long Term: Adherence to nutrition and physical activity/exercise program aimed toward attainment of established weight goal;Weight Maintenance: Understanding of the daily nutrition guidelines, which includes 25-35% calories from fat, 7% or less cal from saturated fats, less than 200mg  cholesterol, less than 1.5gm of sodium, & 5 or more servings of fruits and vegetables daily;Understanding recommendations for meals to include 15-35% energy as protein, 25-35% energy from fat, 35-60% energy from carbohydrates, less than 200mg  of dietary cholesterol, 20-35 gm of total fiber daily;Understanding of distribution of calorie intake throughout the day with the consumption of 4-5 meals/snacks    Improve shortness of breath with ADL's Yes    Intervention Provide education, individualized exercise plan and daily activity instruction to help decrease symptoms of SOB with activities of daily living.    Expected Outcomes Short Term: Improve cardiorespiratory fitness to achieve a  reduction of symptoms when performing ADLs;Long Term: Be able to perform more ADLs without symptoms or delay the onset of symptoms    Hypertension Yes    Intervention Provide education on lifestyle modifcations including regular physical activity/exercise, weight management, moderate sodium restriction and increased consumption of fresh fruit, vegetables, and low fat dairy, alcohol moderation, and smoking cessation.;Monitor prescription use compliance.    Expected Outcomes Short Term: Continued assessment and intervention until BP is < 140/74mm HG in hypertensive participants. < 130/68mm HG in hypertensive participants with diabetes, heart failure or chronic kidney  disease.;Long Term: Maintenance of blood pressure at goal levels.    Lipids Yes    Intervention Provide education and support for participant on nutrition & aerobic/resistive exercise along with prescribed medications to achieve LDL 70mg , HDL >40mg .    Expected Outcomes Short Term: Participant states understanding of desired cholesterol values and is compliant with medications prescribed. Participant is following exercise prescription and nutrition guidelines.;Long Term: Cholesterol controlled with medications as prescribed, with individualized exercise RX and with personalized nutrition plan. Value goals: LDL < 70mg , HDL > 40 mg.             Education:Diabetes - Individual verbal and written instruction to review signs/symptoms of diabetes, desired ranges of glucose level fasting, after meals and with exercise. Acknowledge that pre and post exercise glucose checks will be done for 3 sessions at entry of program.   Know Your Numbers and Heart Failure: - Group verbal and visual instruction to discuss disease risk factors for cardiac and pulmonary disease and treatment options.  Reviews associated critical values for Overweight/Obesity, Hypertension, Cholesterol, and Diabetes.  Discusses basics of heart failure: signs/symptoms and treatments.  Introduces Heart Failure Zone chart for action plan for heart failure.  Written material given at graduation.   Core Components/Risk Factors/Patient Goals Review:    Core Components/Risk Factors/Patient Goals at Discharge (Final Review):    ITP Comments:  ITP Comments     Row Name 11/08/22 0937 11/11/22 1322 11/16/22 0831 11/24/22 1244     ITP Comments Virtual Visit completed. Patient informed on EP and RD appointment and 6 Minute walk test. Patient also informed of patient health questionnaires on My Chart. Patient Verbalizes understanding. Visit diagnosis can be found in [facility] 10/14/2022. Completed and gym orientation. Initial ITP created and  sent for review to Dr. Jinny Sanders, Medical Director. First full day of exercise!  Patient was oriented to gym and equipment including functions, settings, policies, and procedures.  Patient's individual exercise prescription and treatment plan were reviewed.  All starting workloads were established based on the results of the 6 minute walk test done at initial orientation visit.  The plan for exercise progression was also introduced and progression will be customized based on patient's performance and goals. 30 Day review completed. Medical Director ITP review done, changes made as directed, and signed approval by Medical Director.    new to prgram             Comments:

## 2022-11-25 ENCOUNTER — Telehealth: Payer: Self-pay

## 2022-11-25 ENCOUNTER — Encounter: Payer: 59 | Admitting: *Deleted

## 2022-11-25 DIAGNOSIS — J449 Chronic obstructive pulmonary disease, unspecified: Secondary | ICD-10-CM

## 2022-11-25 DIAGNOSIS — J441 Chronic obstructive pulmonary disease with (acute) exacerbation: Secondary | ICD-10-CM | POA: Diagnosis not present

## 2022-11-25 MED ORDER — METOPROLOL SUCCINATE ER 25 MG PO TB24
25.0000 mg | ORAL_TABLET | Freq: Every day | ORAL | 0 refills | Status: DC
  Filled 2022-12-20: qty 90, 90d supply, fill #0

## 2022-11-25 MED ORDER — AMLODIPINE BESYLATE 10 MG PO TABS
10.0000 mg | ORAL_TABLET | Freq: Every day | ORAL | 2 refills | Status: DC
  Filled 2022-12-20: qty 30, 30d supply, fill #0
  Filled 2023-01-18: qty 30, 30d supply, fill #1
  Filled 2023-03-26 – 2023-03-31 (×3): qty 30, 30d supply, fill #2

## 2022-11-25 MED ORDER — GABAPENTIN 300 MG PO CAPS
ORAL_CAPSULE | ORAL | 0 refills | Status: DC
  Filled 2022-12-20: qty 120, 30d supply, fill #0

## 2022-11-25 MED ORDER — VALSARTAN 40 MG PO TABS
40.0000 mg | ORAL_TABLET | Freq: Every day | ORAL | 0 refills | Status: DC
  Filled 2022-12-20: qty 30, 30d supply, fill #0

## 2022-11-25 MED ORDER — METHOCARBAMOL 500 MG PO TABS: 500.0000 mg | ORAL_TABLET | Freq: Four times a day (QID) | ORAL | 0 refills | Status: DC | PRN

## 2022-11-25 MED ORDER — BENZONATATE 100 MG PO CAPS
100.0000 mg | ORAL_CAPSULE | Freq: Two times a day (BID) | ORAL | 0 refills | Status: DC | PRN
  Filled 2022-12-20 – 2022-12-21 (×2): qty 20, 10d supply, fill #0

## 2022-11-25 NOTE — Telephone Encounter (Signed)
..   Medicaid Managed Care   Unsuccessful Outreach Note  11/25/2022 Name: Larry Maxwell MRN: [id number] DOB: 1965/04/03  Referred by: Sherlyn Hay, DO Reason for referral : Appointment (I called the patient today to get him scheduled with the MM LCSW. He did not answer but I did leave my name and number on his VM.)   An unsuccessful telephone outreach was attempted today. The patient was referred to the case management team for assistance with care management and care coordination.   Follow Up Plan: A HIPAA compliant phone message was left for the patient providing contact information and requesting a return call.  The care management team will reach out to the patient again over the next 7 days.   Weston Settle Care Guide  [facility] Managed  Care Guide [facility]  [phone]

## 2022-11-25 NOTE — Progress Notes (Signed)
Daily Session Note  Patient Details  Name: Larry Maxwell MRN: [id number] Date of Birth: August 14, 1965 Referring Provider:   Flowsheet Row Pulmonary Rehab from 11/11/2022 in [facility] Cardiac and Pulmonary Rehab  Referring Provider Janann Colonel       Encounter Date: 11/25/2022  Check In:  Session Check In - 11/25/22 0743       Check-In   Supervising physician immediately available to respond to emergencies See telemetry face sheet for immediately available ER MD    Location [facility]-Cardiac & Pulmonary Rehab    Staff Present Cora Collum, RN, BSN, CCRP;Joseph Hood, RCP,RRT,BSRT;Maxon [place] BS, , Exercise Physiologist;Marnee Sherrard Katrinka Blazing, RN, [place]    Virtual Visit No    Medication changes reported     No    Fall or balance concerns reported    No    Warm-up and Cool-down Performed on first and last piece of equipment    Resistance Training Performed Yes    VAD Patient? No    PAD/SET Patient? No      Pain Assessment   Currently in Pain? No/denies                Social History   Tobacco Use  Smoking Status Former   Current packs/day: 0.00   Average packs/day: 1.5 packs/day for 20.0 years (30.0 ttl pk-yrs)   Types: Cigarettes   Start date: 12/19/2001   Quit date: 12/19/2021   Years since quitting: 0.9  Smokeless Tobacco Never    Goals Met:  Independence with exercise equipment Exercise tolerated well No report of concerns or symptoms today Strength training completed today  Goals Unmet:  Not Applicable  Comments: Pt able to follow exercise prescription today without complaint.  Will continue to monitor for progression.    Dr. Bethann Punches is Medical Director for [facility].  Dr. Vida Rigger is Medical Director for [facility] Pulmonary Rehabilitation.

## 2022-11-26 ENCOUNTER — Other Ambulatory Visit: Payer: Self-pay | Admitting: Licensed Clinical Social Worker

## 2022-11-26 DIAGNOSIS — F321 Major depressive disorder, single episode, moderate: Secondary | ICD-10-CM

## 2022-11-26 NOTE — Patient Instructions (Signed)
Visit Information  Larry Maxwell was given information about Medicaid Managed Care team care coordination services as a part of their [place] Medicaid benefit. Larry Maxwell verbally consented to engagement with the [facility] Managed Care team.   If you are experiencing a medical emergency, please call 911 or report to your local emergency department or urgent care.   If you have a non-emergency medical problem during routine business hours, please contact your provider's office and ask to speak with a nurse.   For questions related to your [place] health plan, please call: [phone]  If you would like to schedule transportation through your [place] plan, please call the following number at least 2 days in advance of your appointment: [phone].   There is no limit to the number of trips during the year between medical appointments, healthcare facilities, or pharmacies. Transportation must be scheduled at least 2 business days before but not more than thirty 30 days before of your appointment.  Call the Behavioral Health Crisis Line at [phone], at any time, 24 hours a day, 7 days a week. If you are in danger or need immediate medical attention call 911.  If you would like help to quit smoking, call 1-800-QUIT-NOW (778-647-7922) OR Espaol: 1-855-Djelo-Ya (6-440-347-4259) o para ms informacin haga clic aqu or Text READY to 563-875 to register via text  Following is a copy of your plan of care:  Care Plan : LCSW Plan of Care  Updates made by Larry Bryant, LCSW since 11/26/2022 12:00 AM     Problem: Depression Identification (Depression)      Goal: Depressive Symptoms Identified   Start Date: 11/26/2022  Note:   Timeframe:  Short-Range Goal Priority:  High Start Date:  11/26/22            Expected End Date: ongoing         Follow Up Date- 12/09/22 at 2 pm  Current Barriers:  Acute Mental Health needs related to stress  management and depression symptoms Knowledge Deficits related to health benefits and local community resources Financial constraints  Desire to relocate with limited housing options Need to initiate psychiatry and therapy services.   Clinical Social Work Goal(s):  Over the next 30 days, patient will work with SW monthly by telephone to reduce or manage symptoms related to stress management.  Over the next 30 days, patient will work with SW to address concerns related to finding a therapist and psychiatrist.   Patient Self Care Activities:  Self administers medications as prescribed Attends all scheduled provider appointments Attends social activities Performs ADL's independently Performs IADL's independently Ability for insight Independent living Motivation for treatment  Patient Coping Strengths:  Hopefulness Self Advocate Able to Communicate Effectively  Patient Self Care Deficits:  Lacks social connections Family is not supportive.  Activities and task to complete in order to accomplish goals.   Please [facility] to follow up with [facility] to arrange transportation to medical appointments [phone]  EMOTIONAL / MENTAL HEALTH SUPPORT RESOURCE EDUCATION PROIVDED- Review email that was sent today on 11/26/22 Please anticipate call from [facility]  - journal feelings and what helps to feel better or worse - watch for early signs of feeling worse - write in journal every day    Why is this important?   Keeping track of your progress will help your treatment team find the right mix of medicine and therapy for you.  Write in your journal every day.  Day-to-day changes in depression  symptoms are normal. It may be more helpful to check your progress at the end of each week instead of every day.    Initial Goal  The following coping skill education was provided for stress relief and mental health management: "When your car dies or a deadline looms, how do you respond? Long-term,  low-grade or acute stress takes a serious toll on your body and mind, so don't ignore feelings of constant tension. Stress is a natural part of life. However, too much stress can harm our health, especially if it continues every day. This is chronic stress and can put you at risk for heart problems like heart disease and depression. Understand what's happening inside your body and learn simple coping skills to combat the negative impacts of everyday stressors.  Types of Stress There are two types of stress: Emotional - types of emotional stress are relationship problems, pressure at work, financial worries, experiencing discrimination or having a major life change. Physical - Examples of physical stress include being sick having pain, not sleeping well, recovery from an injury or having an alcohol and drug use disorder. Fight or Flight Sudden or ongoing stress activates your nervous system and floods your bloodstream with adrenaline and cortisol, two hormones that raise blood pressure, increase heart rate and spike blood sugar. These changes pitch your body into a fight or flight response. That enabled our ancestors to outrun saber-toothed tigers, and it's helpful today for situations like dodging a car accident. But most modern chronic stressors, such as finances or a challenging relationship, keep your body in that heightened state, which hurts your health. Effects of Too Much Stress If constantly under stress, most of [place] will eventually start to function less well.  Multiple studies link chronic stress to a higher risk of heart disease, stroke, depression, weight gain, memory loss and even premature death, so it's important to recognize the warning signals. Talk to your doctor about ways to manage stress if you're experiencing any of these symptoms: Prolonged periods of poor sleep. Regular, severe headaches. Unexplained weight loss or gain. Feelings of isolation, withdrawal or  worthlessness. Constant anger and irritability. Loss of interest in activities. Constant worrying or obsessive thinking. Excessive alcohol or drug use. Inability to concentrate.  10 Ways to Cope with Chronic Stress It's key to recognize stressful situations as they occur because it allows you to focus on managing how you react. We all need to know when to close our eyes and take a deep breath when we feel tension rising. Use these tips to prevent or reduce chronic stress. 1. Rebalance Work and Home All work and no play? If you're spending too much time at the office, intentionally put more dates in your calendar to enjoy time for fun, either alone or with others. 2. Get Regular Exercise Moving your body on a regular basis balances the nervous system and increases blood circulation, helping to flush out stress hormones. Even a daily 20-minute walk makes a difference. Any kind of exercise can lower stress and improve your mood ? just pick activities that you enjoy and make it a regular habit. 3. Eat Well and Limit Alcohol and Stimulants Alcohol, nicotine and caffeine may temporarily relieve stress but have negative health impacts and can make stress worse in the long run. Well-nourished bodies cope better, so start with a good breakfast, add more organic fruits and vegetables for a well-balanced diet, avoid processed foods and sugar, try herbal tea and drink more water. 4. Connect with  Supportive People Talking face to face with another person releases hormones that reduce stress. Lean on those good listeners in your life. 5. Carve Out Hobby Time Do you enjoy gardening, reading, listening to music or some other creative pursuit? Engage in activities that bring you pleasure and joy; research shows that reduces stress by almost half and lowers your heart rate, too. 6. Practice Meditation, Stress Reduction or Yoga Relaxation techniques activate a state of restfulness that counterbalances your body's  fight-or-flight hormones. Even if this also means a 10-minute break in a long day: listen to music, read, go for a walk in nature, do a hobby, take a bath or spend time with a friend. Also consider doing a mindfulness exercise or try a daily deep breathing or imagery practice. Deep Breathing Slow, calm and deep breathing can help you relax. Try these steps to focus on your breathing and repeat as needed. Find a comfortable position and close your eyes. Exhale and drop your shoulders. Breathe in through your nose; fill your lungs and then your belly. Think of relaxing your body, quieting your mind and becoming calm and peaceful. Breathe out slowly through your nose, relaxing your belly. Think of releasing tension, pain, worries or distress. Repeat steps three and four until you feel relaxed. Imagery This involves using your mind to excite the senses -- sound, vision, smell, taste and feeling. This may help ease your stress. Begin by getting comfortable and then do some slow breathing. Imagine a place you love being at. It could be somewhere from your childhood, somewhere you vacationed or just a place in your imagination. Feel how it is to be in the place you're imagining. Pay attention to the sounds, air, colors, and who is there with you. This is a place where you feel cared for and loved. All is well. You are safe. Take in all the smells, sounds, tastes and feelings. As you do, feel your body being nourished and healed. Feel the calm that surrounds you. Breathe in all the good. Breathe out any discomfort or tension. 7. Sleep Enough If you get less than seven to eight hours of sleep, your body won't tolerate stress as well as it could. If stress keeps you up at night, address the cause, and add extra meditation into your day to make up for the lost z's. Try to get seven to nine hours of sleep each night. Make a regular bedtime schedule. Keep your room dark and cool. Try to avoid computers, TV, cell  phones and tablets before bed. 8. Bond with Connections You Enjoy Go out for a coffee with a friend, chat with a neighbor, call a family member, visit with a clergy member, or even hang out with your pet. Clinical studies show that spending even a short time with a companion animal can cut anxiety levels almost in half. 9. Take a Vacation Getting away from it all can reset your stress tolerance by increasing your mental and emotional outlook, which makes you a happier, more productive person upon return. Leave your cellphone and laptop at home! 10. See a Counselor, Coach or Therapist If negative thoughts overwhelm your ability to make positive changes, it's time to seek professional help. Make an appointment today--your health and life are worth it."     24- Hour Availability:    [facility]  [address] [place], [place] [phone] Crisis [phone]   [facility] [phone]  [facility] Crisis  Service  [phone]    [facility]  [phone] (after hours)   Therapeutic Alternative/Mobile Crisis   [phone]   [place] National Suicide Hotline  505-820-5948 Len Childs) [place] 725   Call [phone]) [phone] for mental health emergencies   [facility]  [phone]);  [place] and [employer]  [phone]); [place], [place], [place], [place], [place], [place], [place]    [place] [facility] for [facility] Residents For 24/7 walk-up access to mental health services for [facility] children (4+), adolescents and adults, please visit the [facility] located at [address] in [place], [place].  *[place] [facility] also provides comprehensive outpatient behavioral health services in a variety of locations around the [place].  Connect With [place] [address] [place], [place] [address] HelpLine: 606-643-2196 or 1-(906)416-0730  Get  Directions  Find Help 24/7 By Phone Call our 24-hour HelpLine at 612-223-6534 or 662 661 2028 for immediate assistance for mental health and substance abuse issues.  Walk-In Help [place]: [facility] (Ages 4 and Up) [place]: Emergency Dept., [facility] Additional Resources National Hopeline Network: 1-800-SUICIDE The National Suicide Prevention Lifeline: 0-623-762-GBTD     Dickie La, BSW, MSW, LCSW Licensed Clinical Social Worker [employer]   [facility] [email or web address] Direct Dial: [phone]

## 2022-11-26 NOTE — Patient Outreach (Signed)
Medicaid Managed Care Social Work Note  11/26/2022 Name:  Larry Maxwell MRN:  [id number] DOB:  09/29/1965  Larry Maxwell is an 57 y.o. year old male who is a primary patient of Larry Hay, DO.  The Medicaid Managed Care Coordination team was consulted for assistance with:  Mental Health Counseling and Resources  Larry Maxwell was given information about Medicaid Managed Care Coordination team services today. Larry Maxwell Patient agreed to services and verbal consent obtained.  Engaged with patient  for by telephone forinitial visit in response to referral for case management and/or care coordination services.   Assessments/Interventions:  Review of past medical history, allergies, medications, health status, including review of consultants reports, laboratory and other test data, was performed as part of comprehensive evaluation and provision of chronic care management services.  SDOH: (Social Determinant of Health) assessments and interventions performed: SDOH Interventions    Flowsheet Row Patient Outreach Telephone from 11/26/2022 in [place] [facility] DEPARTMENT Care Coordination from 11/22/2022 in [facility] Community Care Coordination Office Visit from 08/04/2022 in [facility] Family Practice Office Visit from 07/21/2022 in [facility] Family Practice  SDOH Interventions      Food Insecurity Interventions -- Intervention Not Indicated -- --  Housing Interventions -- Intervention Not Indicated -- --  Transportation Interventions -- SCAT Psychologist, clinical) -- --  Depression Interventions/Treatment  Referral to Psychiatry, Medication, Counseling Referral to Psychiatry, Medication, Counseling Currently on Treatment Medication  Stress Interventions Offered [employer], Provide Counseling -- -- --       Advanced Directives Status:  See Care Plan for related entries.  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) 10/25/2022   Restrictive lung disease 08/25/2022   Paresthesia of right upper extremity 08/17/2022   History of diverticulosis 08/09/2022   Nicotine dependence with current use 08/04/2022   Prediabetes 08/02/2022   Hypertension 07/21/2022   Asthma, allergic, mild persistent, uncomplicated 07/21/2022   Depression, major, single episode, moderate (HCC) 07/21/2022   Mixed hyperlipidemia 07/21/2022   Chronic midline low back pain with left-sided sciatica 07/21/2022    Conditions to be addressed/monitored per PCP order:  Depression  Care Plan : LCSW Plan of Care  Updates made by Larry Bryant, LCSW since 11/26/2022 12:00 AM     Problem: Depression Identification (Depression)      Goal: Depressive Symptoms Identified   Start Date: 11/26/2022  Note:   Timeframe:  Short-Range Goal Priority:  High Start Date:  11/26/22            Expected End Date: ongoing         Follow Up Date- 12/09/22 at 2 pm  Current Barriers:  Acute Mental Health needs related to stress management and depression symptoms Knowledge Deficits related to health benefits and local community resources Financial constraints  Desire to relocate with limited housing options Need to initiate psychiatry and therapy services.   Clinical Social Work Goal(s):  Over the next 30  days, patient will work with SW monthly by telephone to reduce or manage symptoms related to stress management.  Over the next 30 days, patient will work with SW to address concerns related to finding a therapist and psychiatrist.    Interventions: Patient interviewed and appropriate assessments performed Provided mental health counseling with regard to stress management  Discussed plans with patient for ongoing care management follow up and provided patient with direct contact information for care management team Emotional/Supportive Counseling provided  Education on mental health service enrollment process Education on the difference between therapy, psychiatry and psychology services Patient reports his main focus right now is managing his financial responsibilities, gaining mental health services and gaining housing resource education.  Email sent to patient with therapy and psychology/psychiatry options in his area Pt is currently in the process of applying for disability-working with an attorney to assist. He resides with sister who assists with meals and daily care when needed. Patient now has been successfully connected with medicaid transportation [place] [phone]. Patient reports that service is effectively working for him. Patient now has stable transportation.  Pt confirmed symptoms od depression related to his medical condition and recent break-up, encouraged verbalization of feeling and use of postive coping strategies-self care emphasized-pt agrreable to ongoing mental health  Patient denies thoughts of harm to self or others-encouraged patient to call [phone] in the event of a mental health crisis  Patient Self Care Activities:  Self administers medications as prescribed Attends all scheduled provider appointments Attends social activities Performs ADL's independently Performs IADL's independently Ability for insight Independent living Motivation for treatment  Patient Coping Strengths:  Hopefulness Self Advocate Able to Communicate Effectively  Patient Self Care Deficits:  Lacks social connections Family is not supportive.  Activities and task to complete in order to accomplish goals.    Please continue to follow up with [facility] to arrange transportation to medical appointments [phone]  EMOTIONAL / MENTAL HEALTH SUPPORT RESOURCE EDUCATION PROIVDED- Review email that was sent today on 11/26/22 Please anticipate call from [facility]  - journal feelings and what helps to feel better or worse - watch for early signs of feeling worse - write in journal every day    Why is this important?   Keeping track of your progress will help your treatment team find the right mix of medicine and therapy for you.  Write in your journal every day.  Day-to-day changes in depression symptoms are normal. It may be more helpful to check your progress at the end of each week instead of every day.    Initial Goal     Follow up:  Patient agrees to Care Plan and Follow-up.  Plan: The Managed Medicaid care management team will reach out to the patient again over the next 30 days.  Date/time of next scheduled Social Work care management/care coordination outreach:  12/09/22 at 2 pm  Dickie La, BSW, MSW, [employer] Licensed Clinical Social Worker [employer]   [facility] [email or web address] Direct Dial: [phone]

## 2022-11-29 ENCOUNTER — Encounter: Payer: Self-pay | Admitting: *Deleted

## 2022-11-30 ENCOUNTER — Encounter: Payer: Medicaid Other | Attending: Pulmonary Disease | Admitting: *Deleted

## 2022-11-30 DIAGNOSIS — J449 Chronic obstructive pulmonary disease, unspecified: Secondary | ICD-10-CM | POA: Diagnosis present

## 2022-11-30 DIAGNOSIS — G8929 Other chronic pain: Secondary | ICD-10-CM | POA: Diagnosis not present

## 2022-11-30 DIAGNOSIS — J984 Other disorders of lung: Secondary | ICD-10-CM | POA: Insufficient documentation

## 2022-11-30 DIAGNOSIS — J4489 Other specified chronic obstructive pulmonary disease: Secondary | ICD-10-CM | POA: Diagnosis not present

## 2022-11-30 DIAGNOSIS — F321 Major depressive disorder, single episode, moderate: Secondary | ICD-10-CM | POA: Diagnosis not present

## 2022-11-30 DIAGNOSIS — M5442 Lumbago with sciatica, left side: Secondary | ICD-10-CM | POA: Insufficient documentation

## 2022-11-30 DIAGNOSIS — J439 Emphysema, unspecified: Secondary | ICD-10-CM | POA: Diagnosis not present

## 2022-11-30 NOTE — Progress Notes (Signed)
Daily Session Note  Patient Details  Name: Larry Maxwell MRN: [id number] Date of Birth: March 09, 1965 Referring Provider:   Flowsheet Row Pulmonary Rehab from 11/11/2022 in [facility] Cardiac and Pulmonary Rehab  Referring Provider Janann Colonel       Encounter Date: 11/30/2022  Check In:  Session Check In - 11/30/22 0753       Check-In   Supervising physician immediately available to respond to emergencies See telemetry face sheet for immediately available ER MD    Location [facility]-Cardiac & Pulmonary Rehab    Staff Present Cora Collum, RN, BSN, CCRP;Noah Tickle, BS, Exercise Physiologist;Margaret Best, MS, Exercise Physiologist    Virtual Visit No    Medication changes reported     No    Fall or balance concerns reported    No    Warm-up and Cool-down Performed on first and last piece of equipment    Resistance Training Performed Yes    VAD Patient? No    PAD/SET Patient? No      Pain Assessment   Currently in Pain? No/denies                Social History   Tobacco Use  Smoking Status Former   Current packs/day: 0.00   Average packs/day: 1.5 packs/day for 20.0 years (30.0 ttl pk-yrs)   Types: Cigarettes   Start date: 12/19/2001   Quit date: 12/19/2021   Years since quitting: 0.9  Smokeless Tobacco Never    Goals Met:  Proper associated with RPD/PD & O2 Sat Independence with exercise equipment Exercise tolerated well No report of concerns or symptoms today  Goals Unmet:  Not Applicable  Comments: Pt able to follow exercise prescription today without complaint.  Will continue to monitor for progression.    Dr. Bethann Punches is Medical Director for [facility] Cardiac Rehabilitation.  Dr. Vida Rigger is Medical Director for [facility] Pulmonary Rehabilitation.

## 2022-12-02 ENCOUNTER — Encounter: Payer: Medicaid Other | Admitting: *Deleted

## 2022-12-02 DIAGNOSIS — J449 Chronic obstructive pulmonary disease, unspecified: Secondary | ICD-10-CM

## 2022-12-02 DIAGNOSIS — J4489 Other specified chronic obstructive pulmonary disease: Secondary | ICD-10-CM | POA: Diagnosis not present

## 2022-12-02 NOTE — Progress Notes (Signed)
Daily Session Note  Patient Details  Name: Larry Maxwell MRN: [id number] Date of Birth: 1965-10-22 Referring Provider:   Flowsheet Row Pulmonary Rehab from 11/11/2022 in [facility] Cardiac and Pulmonary Rehab  Referring Provider Janann Colonel       Encounter Date: 12/02/2022  Check In:  Session Check In - 12/02/22 0739       Check-In   Supervising physician immediately available to respond to emergencies See telemetry face sheet for immediately available ER MD    Location [facility]-Cardiac & Pulmonary Rehab    Staff Present Ronette Deter, BS, Exercise Physiologist;Joseph Somis, [place];Cora Collum, RN, BSN, CCRP;Ayden Hardwick Katrinka Blazing, RN, ADN    Virtual Visit No    Medication changes reported     No    Fall or balance concerns reported    No    Warm-up and Cool-down Performed on first and last piece of equipment    Resistance Training Performed Yes    VAD Patient? No    PAD/SET Patient? No      Pain Assessment   Currently in Pain? No/denies                Social History   Tobacco Use  Smoking Status Former   Current packs/day: 0.00   Average packs/day: 1.5 packs/day for 20.0 years (30.0 ttl pk-yrs)   Types: Cigarettes   Start date: 12/19/2001   Quit date: 12/19/2021   Years since quitting: 0.9  Smokeless Tobacco Never    Goals Met:  Independence with exercise equipment Exercise tolerated well No report of concerns or symptoms today Strength training completed today  Goals Unmet:  Not Applicable  Comments: Pt able to follow exercise prescription today without complaint.  Will continue to monitor for progression.    Dr. Bethann Punches is Medical Director for [facility] Cardiac Rehabilitation.  Dr. Vida Rigger is Medical Director for [facility] Pulmonary Rehabilitation.

## 2022-12-03 ENCOUNTER — Telehealth: Payer: Self-pay | Admitting: Licensed Clinical Social Worker

## 2022-12-03 ENCOUNTER — Ambulatory Visit: Payer: 59 | Admitting: Family Medicine

## 2022-12-03 ENCOUNTER — Ambulatory Visit (INDEPENDENT_AMBULATORY_CARE_PROVIDER_SITE_OTHER): Payer: Medicaid Other | Admitting: Family Medicine

## 2022-12-03 VITALS — BP 142/89 | HR 65 | Resp 16 | Ht 73.0 in | Wt 189.9 lb

## 2022-12-03 DIAGNOSIS — G4701 Insomnia due to medical condition: Secondary | ICD-10-CM | POA: Diagnosis not present

## 2022-12-03 DIAGNOSIS — R5382 Chronic fatigue, unspecified: Secondary | ICD-10-CM

## 2022-12-03 DIAGNOSIS — I10 Essential (primary) hypertension: Secondary | ICD-10-CM | POA: Diagnosis not present

## 2022-12-03 DIAGNOSIS — Z23 Encounter for immunization: Secondary | ICD-10-CM

## 2022-12-03 DIAGNOSIS — J4489 Other specified chronic obstructive pulmonary disease: Secondary | ICD-10-CM

## 2022-12-03 NOTE — Progress Notes (Signed)
Established patient visit   Patient: Larry Maxwell   DOB: April 18, 1965   57 y.o. Male  MRN: [id number] Visit Date: 12/03/2022  Today's healthcare provider: Sherlyn Hay, DO   Chief Complaint  Patient presents with   Fatigue    Tired all the time. He gets up at 230 am and he is up the rest of the day. He has trouble sleeping and his meds aren't helping. He is having trouble getting basic necessities and has no income. He ran out of BP meds on Wednesday and cannot afford more. He has a slight headache.    Subjective    HPI Discussed the use of AI scribe software for clinical note transcription with the patient, who gave verbal consent to proceed.  The patient, who is currently unemployed and applying for disability, presents with ongoing respiratory issues. He reports improved breathing since starting pulmonary rehabilitation, which he attends twice a week, soon to increase to three times a week. He also performs exercises at home and is learning breathing techniques. Despite these improvements, he still experiences significant shortness of breath, particularly after rehab sessions.  The patient also reports difficulty sleeping, often going to bed as early as 7:30 PM and waking up around 2:30 AM. He attributes this disrupted sleep pattern to his early morning rehab sessions and the fatigue he experiences afterward.    - does admit to taking naps after his pulmonary rehab sessions  The patient has been struggling with medication access due to financial constraints. He reports running out of his blood pressure medications, which he last took several days ago. He expresses significant distress over his current situation, as he has always been self-sufficient and is now struggling to afford necessary medications.  The patient has recently been approved for Medicaid, which he hopes will alleviate some of his financial strain.  As noted above, he is also in the process of applying for  disability benefits, which he expects to receive by the end of the year.  In addition to his physical health concerns, the patient is also dealing with mental health issues. He has upcoming appointments with a Child psychotherapist and a psychiatrist, and he is scheduled to start therapy at the end of the month. He expresses concern about the number of appointments he has and the potential difficulty of finding a job with his current health issues and schedule, that he states he would like to have a job if possible as he does not like to remain idle.  Despite these challenges, the patient remains committed to his health. He is diligent about attending his rehab sessions and is proactive in managing his health, including avoiding smoking and being mindful of his medication use. He expresses a desire to improve his health so he can then focus on finding a job.     Medications: Outpatient Medications Prior to Visit  Medication Sig   albuterol (VENTOLIN HFA) 108 (90 Base) MCG/ACT inhaler Inhale 2 puffs into the lungs every 6 (six) hours as needed for wheezing or shortness of breath.   amLODipine (NORVASC) 10 MG tablet Take 1 tablet (10 mg total) by mouth daily for 30 doses.   benzonatate (TESSALON) 100 MG capsule Take 1 capsule (100 mg total) by mouth 2 (two) times daily as needed for cough.   celecoxib (CELEBREX) 100 MG capsule Take 1 capsule (100 mg total) by mouth 2 (two) times daily.   cetirizine (ZYRTEC) 10 MG tablet Take 1 tablet (10  mg total) by mouth daily.   chlorthalidone (HYGROTON) 25 MG tablet TAKE 1 TABLET (25 MG TOTAL) BY MOUTH DAILY.   gabapentin (NEURONTIN) 300 MG capsule Take 1 tab morning, 1 tab at midday, and 2 tabs before bedtime   ipratropium-albuterol (DUONEB) 0.5-2.5 (3) MG/3ML SOLN Take 3 mLs by nebulization every 6 (six) hours as needed.   methocarbamol (ROBAXIN) 500 MG tablet Take 1 tablet (500 mg total) by mouth every 6 (six) hours as needed for muscle spasms.   metoprolol  succinate (TOPROL-XL) 25 MG 24 hr tablet Take 1 tablet (25 mg total) by mouth daily.   montelukast (SINGULAIR) 10 MG tablet Take 1 tablet (10 mg total) by mouth at bedtime.   rosuvastatin (CRESTOR) 10 MG tablet Take 1 tablet (10 mg total) by mouth daily.   sertraline (ZOLOFT) 50 MG tablet Take 1 tablet (50 mg total) by mouth daily.   valsartan (DIOVAN) 40 MG tablet Take 1 tablet (40 mg total) by mouth daily. Start taking after one week, if top BP number is >125   No facility-administered medications prior to visit.    Review of Systems  Respiratory:  Positive for shortness of breath (DOE). Negative for cough and wheezing.   Cardiovascular:  Negative for chest pain, palpitations and leg swelling.  Neurological:  Negative for weakness and headaches.        Objective    BP (!) 142/89   Pulse 65   Resp 16   Ht 6\' 1"  (1.854 m)   Wt 189 lb 14.4 oz (86.1 kg)   SpO2 100%   BMI 25.05 kg/m     Physical Exam Vitals and nursing note reviewed.  Constitutional:      General: He is not in acute distress.    Appearance: Normal appearance.  HENT:     Head: Normocephalic and atraumatic.  Eyes:     General: No scleral icterus.    Conjunctiva/sclera: Conjunctivae normal.  Cardiovascular:     Rate and Rhythm: Normal rate.  Pulmonary:     Effort: Pulmonary effort is normal.  Neurological:     Mental Status: He is alert and oriented to person, place, and time. Mental status is at baseline.  Psychiatric:        Mood and Affect: Mood normal.        Behavior: Behavior normal.       No results found for any visits on 12/03/22.  Assessment & Plan    Primary hypertension Assessment & Plan: Blood pressure mildly elevated today. Patient reports running out of blood pressure medications. Last dose taken on Wednesday. Blood pressure was well-controlled while on medication. - Per discussion with social worker, because he has Medicaid now, patient should be able to get his medications even  if he is unable to pay for them; he will simply have to pay when he is able. - Advised patient to pick up refills of his blood pressure medications as soon as possible.   Asthma-COPD overlap syndrome [facility]) Assessment & Plan: Patient reports improved breathing with nebulizer use and pulmonary rehab. Rehab frequency increasing to three times a week. Patient also performing exercises at home and learning breathing techniques. -Continue current regimen of nebulizer use and pulmonary rehab. Patient sees pulmonology; will defer to their management   Insomnia due to medical condition Assessment & Plan: Patient reports early morning awakenings and difficulty returning to sleep. No issues with falling asleep initially. Patient acknowledges napping after rehab sessions. - Advise patient to stay try to  active and avoid napping during the day to help adjust sleep schedule.  Acknowledged but this will be extremely difficult due to him becoming very easily fatigued from physical activity due to his severe COPD.   Needs flu shot -     Flu vaccine trivalent PF, 6mos and older(Flulaval,Afluria,Fluarix,Fluzone)   Return in about 2 months (around 02/02/2023) for HTN, COPD.      I discussed the assessment and treatment plan with the patient  The patient was provided an opportunity to ask questions and all were answered. The patient agreed with the plan and demonstrated an understanding of the instructions.   The patient was advised to call back or seek an in-person evaluation if the symptoms worsen or if the condition fails to improve as anticipated.    Sherlyn Hay, DO  [facility] [phone] (phone) [phone] (fax)  [facility]

## 2022-12-03 NOTE — Patient Outreach (Signed)
Care Coordination  12/03/2022  Larry Maxwell [birth year] [id number]  [facility] LCSW completed care coordination with PCP regarding patient's inability to afford his medications. Patient may be in need of a care management pharmacy referral to provide ongoing assistance. PCP was advised that pharmacies cannot hold any prescriptions of [place] Medicaid patients if they cannot afford the copay, but patients will be expected to pay that amount at a later day.  Dickie La, BSW, MSW, LCSW Licensed Clinical Social Worker [employer] [facility]   [facility] [email or web address] Direct Dial: [phone]

## 2022-12-07 DIAGNOSIS — J449 Chronic obstructive pulmonary disease, unspecified: Secondary | ICD-10-CM

## 2022-12-07 DIAGNOSIS — J4489 Other specified chronic obstructive pulmonary disease: Secondary | ICD-10-CM | POA: Diagnosis not present

## 2022-12-07 NOTE — Progress Notes (Signed)
Daily Session Note  Patient Details  Name: Larry Maxwell MRN: [id number] Date of Birth: 10-10-1965 Referring Provider:   Flowsheet Row Pulmonary Rehab from 11/11/2022 in [facility] Cardiac and Pulmonary Rehab  Referring Provider Janann Colonel       Encounter Date: 12/07/2022  Check In:  Session Check In - 12/07/22 0744       Check-In   Supervising physician immediately available to respond to emergencies See telemetry face sheet for immediately available ER MD    Location [facility]-Cardiac & Pulmonary Rehab    Staff Present Maxon Conetta BS, , Exercise Physiologist;Loree Shehata, RN, BSN;Noah Tickle, BS, Exercise Physiologist;Margaret Best, MS, Exercise Physiologist    Virtual Visit No    Medication changes reported     No    Fall or balance concerns reported    No    Warm-up and Cool-down Performed on first and last piece of equipment    Resistance Training Performed Yes    VAD Patient? No    PAD/SET Patient? No      Pain Assessment   Currently in Pain? No/denies                Social History   Tobacco Use  Smoking Status Former   Current packs/day: 0.00   Average packs/day: 1.5 packs/day for 20.0 years (30.0 ttl pk-yrs)   Types: Cigarettes   Start date: 12/19/2001   Quit date: 12/19/2021   Years since quitting: 0.9  Smokeless Tobacco Never    Goals Met:  Proper associated with RPD/PD & O2 Sat Independence with exercise equipment Using PLB without cueing & demonstrates good technique Exercise tolerated well No report of concerns or symptoms today Strength training completed today  Goals Unmet:  Not Applicable  Comments: Pt able to follow exercise prescription today without complaint.  Will continue to monitor for progression.   Dr. Bethann Punches is Medical Director for [facility] Cardiac Rehabilitation.  Dr. Vida Rigger is Medical Director for [facility] Pulmonary Rehabilitation.

## 2022-12-09 ENCOUNTER — Other Ambulatory Visit: Payer: Self-pay | Admitting: Licensed Clinical Social Worker

## 2022-12-09 ENCOUNTER — Encounter: Payer: Medicaid Other | Admitting: *Deleted

## 2022-12-09 ENCOUNTER — Encounter: Payer: Self-pay | Admitting: Licensed Clinical Social Worker

## 2022-12-09 DIAGNOSIS — J4489 Other specified chronic obstructive pulmonary disease: Secondary | ICD-10-CM

## 2022-12-09 DIAGNOSIS — F321 Major depressive disorder, single episode, moderate: Secondary | ICD-10-CM

## 2022-12-09 DIAGNOSIS — J449 Chronic obstructive pulmonary disease, unspecified: Secondary | ICD-10-CM

## 2022-12-09 DIAGNOSIS — J439 Emphysema, unspecified: Secondary | ICD-10-CM

## 2022-12-09 NOTE — Progress Notes (Signed)
Daily Session Note  Patient Details  Name: Larry Maxwell MRN: [id number] Date of Birth: 12-Apr-1965 Referring Provider:   Flowsheet Row Pulmonary Rehab from 11/11/2022 in [facility] Cardiac and Pulmonary Rehab  Referring Provider Janann Colonel       Encounter Date: 12/09/2022  Check In:  Session Check In - 12/09/22 0750       Check-In   Supervising physician immediately available to respond to emergencies See telemetry face sheet for immediately available ER MD    Location [facility]-Cardiac & Pulmonary Rehab    Staff Present Ronette Deter, BS, Exercise Physiologist;Joseph Shelbie Proctor, RN, ADN;Susanne Bice, RN, BSN, CCRP    Virtual Visit No    Medication changes reported     No    Fall or balance concerns reported    No    Warm-up and Cool-down Performed on first and last piece of equipment    Resistance Training Performed Yes    VAD Patient? No    PAD/SET Patient? No      Pain Assessment   Currently in Pain? No/denies                Social History   Tobacco Use  Smoking Status Former   Current packs/day: 0.00   Average packs/day: 1.5 packs/day for 20.0 years (30.0 ttl pk-yrs)   Types: Cigarettes   Start date: 12/19/2001   Quit date: 12/19/2021   Years since quitting: 0.9  Smokeless Tobacco Never    Goals Met:  Independence with exercise equipment Exercise tolerated well No report of concerns or symptoms today Strength training completed today  Goals Unmet:  Not Applicable  Comments: Pt able to follow exercise prescription today without complaint.  Will continue to monitor for progression.    Dr. Bethann Punches is Medical Director for [facility] Cardiac Rehabilitation.  Dr. Vida Rigger is Medical Director for [facility] Pulmonary Rehabilitation.

## 2022-12-09 NOTE — Patient Instructions (Signed)
Visit Information  Mr. Ballard was given information about Medicaid Managed Care team care coordination services as a part of their [place] benefit. [name] verbally consented to engagement with the [facility] Managed Care team.   If you are experiencing a medical emergency, please call 911 or report to your local emergency department or urgent care.   If you have a non-emergency medical problem during routine business hours, please contact your provider's office and ask to speak with a nurse.   For questions related to your [place] health plan, please call: [phone]  If you would like to schedule transportation through your [place] plan, please call the following number at least 2 days in advance of your appointment: [phone].   There is no limit to the number of trips during the year between medical appointments, healthcare facilities, or pharmacies. Transportation must be scheduled at least 2 business days before but not more than thirty 30 days before of your appointment.  Call the Behavioral Health Crisis Line at [phone], at any time, 24 hours a day, 7 days a week. If you are in danger or need immediate medical attention call 911.  If you would like help to quit smoking, call 1-800-QUIT-NOW ((347) 887-3007) OR Espaol: 1-855-Djelo-Ya (0-347-425-9563) o para ms informacin haga clic aqu or Text READY to 875-643 to register via text  Following is a copy of your plan of care:  Care Plan : LCSW Plan of Care  Updates made by Gustavus Bryant, LCSW since 12/09/2022 12:00 AM     Problem: Depression Identification (Depression)      Goal: Depressive Symptoms Identified   Start Date: 11/26/2022  Note:   Timeframe:  Short-Range Goal Priority:  High Start Date:  11/26/22            Expected End Date: ongoing         Follow Up Date- 01/06/23 at 1 pm  Current Barriers:  Acute Mental Health needs related to  stress management and depression symptoms Knowledge Deficits related to health benefits and local community resources Financial constraints  Desire to relocate with limited housing options Need to initiate psychiatry and therapy services.   Clinical Social Work Goal(s):  Over the next 30 days, patient will work with SW monthly by telephone to reduce or manage symptoms related to stress management.  Over the next 30 days, patient will work with SW to address concerns related to finding a therapist and psychiatrist.   Patient activities   Self administers medications as prescribed Attends all scheduled provider appointments Attends social activities Performs ADL's independently Performs IADL's independently Ability for insight Independent living Motivation for treatment  Patient Coping Strengths:  Hopefulness Self Advocate Able to Communicate Effectively  Patient Self Care Deficits:  Lacks social connections Family is not supportive.  Activities and task to complete in order to accomplish goals.   Please continue to follow up with [facility] to arrange transportation to medical appointments [phone]  EMOTIONAL / MENTAL HEALTH SUPPORT RESOURCE EDUCATION PROIVDED- Review email that was sent today on 11/26/22 Please anticipate call from [facility]  - journal feelings and what helps to feel better or worse - watch for early signs of feeling worse - write in journal every day    Why is this important?   Keeping track of your progress will help your treatment team find the right mix of medicine and therapy for you.  Write in your journal every day.  Day-to-day changes in depression symptoms  are normal. It may be more helpful to check your progress at the end of each week instead of every day.       24- Hour Availability:    [facility]  [address] [place], [place] [phone] Crisis [phone]   [facility]  [phone]  [place] [facility]  [phone]    [facility]  [phone] (after hours)   Therapeutic Alternative/Mobile Crisis   [phone]   [place] National Suicide Hotline  819-612-9132 Len Child[place]) [place] [address]   Call [phone] for mental health emergencies   [facility]  [phone]);  [place] and [employer]  [phone]); [place], [place], [place], [place], [place], [place], [place]    [facility] [place] Residents For 24/7 walk-up access to mental health services for [facility] children (4+), adolescents and adults, please visit the [facility] located at [address] in [place], [place].  *[address] also provides comprehensive outpatient behavioral health services in a variety of locations around the [place].  Connect With [place] [address] [place], [place] [address] HelpLine: (636) 611-5044 or 1-604-682-5954  Get Directions  Find Help 24/7 By Phone Call our 24-hour HelpLine at 2723802989 or 972-370-9152 for immediate assistance for mental health and substance abuse issues.  Walk-In Help [place]: [facility] (Ages 4 and Up) [place]: Emergency Dept., [facility] Additional Resources National Hopeline Network: 1-800-SUICIDE The National Suicide Prevention Lifeline: 1-800-273-TALK     The following coping skill education was provided for stress relief and mental health management: "When your car dies or a deadline looms, how do you respond? Long-term, low-grade or acute stress takes a serious toll on your body and mind, so don't ignore feelings of constant tension. Stress is a natural part of life. However, too much stress can harm our health, especially if it continues every day. This is chronic stress and can put you at risk for heart problems like heart disease and  depression. Understand what's happening inside your body and learn simple coping skills to combat the negative impacts of everyday stressors.  Types of Stress There are two types of stress: Emotional - types of emotional stress are relationship problems, pressure at work, financial worries, experiencing discrimination or having a major life change. Physical - Examples of physical stress include being sick having pain, not sleeping well, recovery from an injury or having an alcohol and drug use disorder. Fight or Flight Sudden or ongoing stress activates your nervous system and floods your bloodstream with adrenaline and cortisol, two hormones that raise blood pressure, increase heart rate and spike blood sugar. These changes pitch your body into a fight or flight response. That enabled our ancestors to outrun saber-toothed tigers, and it's helpful today for situations like dodging a car accident. But most modern chronic stressors, such as finances or a challenging relationship, keep your body in that heightened state, which hurts your health. Effects of Too Much Stress If constantly under stress, most of [place] will eventually start to function less well.  Multiple studies link chronic stress to a higher risk of heart disease, stroke, depression, weight gain, memory loss and even premature death, so it's important to recognize the warning signals. Talk to your doctor about ways to manage stress if you're experiencing any of these symptoms: Prolonged periods of poor sleep. Regular, severe headaches. Unexplained weight loss or gain.  Feelings of isolation, withdrawal or worthlessness. Constant anger and irritability. Loss of interest in activities. Constant worrying or obsessive thinking. Excessive alcohol or drug use. Inability to concentrate.  10 Ways to Cope with Chronic Stress It's key to recognize stressful situations as they occur because it allows you to focus on managing how you react. We all  need to know when to close our eyes and take a deep breath when we feel tension rising. Use these tips to prevent or reduce chronic stress. 1. Rebalance Work and Home All work and no play? If you're spending too much time at the office, intentionally put more dates in your calendar to enjoy time for fun, either alone or with others. 2. Get Regular Exercise Moving your body on a regular basis balances the nervous system and increases blood circulation, helping to flush out stress hormones. Even a daily 20-minute walk makes a difference. Any kind of exercise can lower stress and improve your mood ? just pick activities that you enjoy and make it a regular habit. 3. Eat Well and Limit Alcohol and Stimulants Alcohol, nicotine and caffeine may temporarily relieve stress but have negative health impacts and can make stress worse in the long run. Well-nourished bodies cope better, so start with a good breakfast, add more organic fruits and vegetables for a well-balanced diet, avoid processed foods and sugar, try herbal tea and drink more water. 4. Connect with Supportive People Talking face to face with another person releases hormones that reduce stress. Lean on those good listeners in your life. 5. Carve Out Hobby Time Do you enjoy gardening, reading, listening to music or some other creative pursuit? Engage in activities that bring you pleasure and joy; research shows that reduces stress by almost half and lowers your heart rate, too. 6. Practice Meditation, Stress Reduction or Yoga Relaxation techniques activate a state of restfulness that counterbalances your body's fight-or-flight hormones. Even if this also means a 10-minute break in a long day: listen to music, read, go for a walk in nature, do a hobby, take a bath or spend time with a friend. Also consider doing a mindfulness exercise or try a daily deep breathing or imagery practice. Deep Breathing Slow, calm and deep breathing can help you relax.  Try these steps to focus on your breathing and repeat as needed. Find a comfortable position and close your eyes. Exhale and drop your shoulders. Breathe in through your nose; fill your lungs and then your belly. Think of relaxing your body, quieting your mind and becoming calm and peaceful. Breathe out slowly through your nose, relaxing your belly. Think of releasing tension, pain, worries or distress. Repeat steps three and four until you feel relaxed. Imagery This involves using your mind to excite the senses -- sound, vision, smell, taste and feeling. This may help ease your stress. Begin by getting comfortable and then do some slow breathing. Imagine a place you love being at. It could be somewhere from your childhood, somewhere you vacationed or just a place in your imagination. Feel how it is to be in the place you're imagining. Pay attention to the sounds, air, colors, and who is there with you. This is a place where you feel cared for and loved. All is well. You are safe. Take in all the smells, sounds, tastes and feelings. As you do, feel your body being nourished and healed. Feel the calm that surrounds you. Breathe in all the good. Breathe out any discomfort or tension. 7. Sleep  Enough If you get less than seven to eight hours of sleep, your body won't tolerate stress as well as it could. If stress keeps you up at night, address the cause, and add extra meditation into your day to make up for the lost z's. Try to get seven to nine hours of sleep each night. Make a regular bedtime schedule. Keep your room dark and cool. Try to avoid computers, TV, cell phones and tablets before bed. 8. Bond with Connections You Enjoy Go out for a coffee with a friend, chat with a neighbor, call a family member, visit with a clergy member, or even hang out with your pet. Clinical studies show that spending even a short time with a companion animal can cut anxiety levels almost in half. 9. Take a  Vacation Getting away from it all can reset your stress tolerance by increasing your mental and emotional outlook, which makes you a happier, more productive person upon return. Leave your cellphone and laptop at home! 10. See a Counselor, Coach or Therapist If negative thoughts overwhelm your ability to make positive changes, it's time to seek professional help. Make an appointment today--your health and life are worth it."  Dickie La, BSW, MSW, LCSW Licensed Clinical Social Worker [employer]   [facility] [email or web address] Direct Dial: [phone]

## 2022-12-09 NOTE — Patient Outreach (Signed)
Medicaid Managed Care Social Work Note  12/09/2022 Name:  Larry Maxwell MRN:  [id number] DOB:  20-Dec-1965  Larry Maxwell is an 57 y.o. year old male who is a primary patient of Sherlyn Hay, DO.  The Medicaid Managed Care Coordination team was consulted for assistance with:  Mental Health Counseling and Resources  Larry Maxwell was given information about Medicaid Managed Care Coordination team services today. Larry Maxwell Patient agreed to services and verbal consent obtained.  Engaged with patient  for by telephone forfollow up visit in response to referral for case management and/or care coordination services.   Assessments/Interventions:  Review of past medical history, allergies, medications, health status, including review of consultants reports, laboratory and other test data, was performed as part of comprehensive evaluation and provision of chronic care management services.  SDOH: (Social Determinant of Health) assessments and interventions performed: SDOH Interventions    Flowsheet Row Patient Outreach Telephone from 12/09/2022 in [place] POPULATION HEALTH DEPARTMENT Pulmonary Rehab from 12/02/2022 in [facility]monary Rehab Patient Outreach Telephone from 11/26/2022 in [place] POPULATION HEALTH DEPARTMENT Care Coordination from 11/22/2022 in [facility] Care Coordination Office Visit from 08/04/2022 in [facility] Office Visit from 07/21/2022 in [facility]  SDOH Interventions        Food Insecurity Interventions -- -- -- Intervention Not Indicated -- --  Housing Interventions -- -- -- Intervention Not Indicated -- --  Transportation Interventions Payor Benefit -- -- SCAT Psychologist, clinical) -- --  Depression Interventions/Treatment  -- Counseling Referral to Psychiatry, Medication, Counseling Referral to Psychiatry, Medication, Counseling Currently on Treatment  Medication  Stress Interventions -- -- [employer], Provide Counseling -- -- --       Advanced Directives Status:  See Care Plan for related entries.  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) 10/25/2022   Restrictive lung disease 08/25/2022   Paresthesia of right upper extremity 08/17/2022   History of diverticulosis 08/09/2022   Nicotine dependence with current use 08/04/2022   Prediabetes 08/02/2022   Hypertension 07/21/2022   Asthma, allergic, mild persistent, uncomplicated 07/21/2022   Depression, major, single episode, moderate (HCC) 07/21/2022   Mixed hyperlipidemia 07/21/2022   Chronic midline low back pain with left-sided sciatica 07/21/2022    Conditions to be addressed/monitored per PCP order:  Depression  Care Plan : LCSW Plan of Care  Updates made by Gustavus Bryant, LCSW since 12/09/2022 12:00 AM     Problem: Depression Identification (Depression)      Goal: Depressive Symptoms Identified   Start Date: 11/26/2022  Note:   Timeframe:  Short-Range Goal Priority:  High Start Date:  11/26/22            Expected End Date: ongoing         Follow Up Date- 01/06/23 at 1 pm  Current Barriers:  Acute Mental Health needs related to stress management and depression symptoms Knowledge Deficits related  to health benefits and local community resources Financial constraints  Desire to relocate with limited housing options Need to initiate psychiatry and therapy services.   Clinical Social Work Goal(s):  Over the next 30 days, patient will  work with SW monthly by telephone to reduce or manage symptoms related to stress management.  Over the next 30 days, patient will work with SW to address concerns related to finding a therapist and psychiatrist.   Interventions: Patient interviewed and appropriate assessments performed Provided mental health counseling with regard to stress management  Discussed plans with patient for ongoing care management follow up and provided patient with direct contact information for care management team Emotional/Supportive Counseling provided  Education on mental health service enrollment process Education on the difference between therapy, psychiatry and psychology services Patient reports his main focus right now is managing his financial responsibilities, gaining mental health services and gaining housing resource education.  Email sent to patient with therapy and psychology/psychiatry options in his area Pt is currently in the process of applying for disability-working with an attorney to assist. He resides with sister who assists with meals and daily care when needed. Patient now has been successfully connected with medicaid transportation [place] [phone]. Patient reports that service is effectively working for him. Patient now has stable transportation.  Pt confirmed symptoms od depression related to his medical condition and recent break-up, encouraged verbalization of feeling and use of postive coping strategies-self care emphasized-pt agrreable to ongoing mental health  Patient denies thoughts of harm to self or others-encouraged patient to call 988 in the event of a mental health crisis 12/09/22- Patient has been successfully approved for food stamps. Pt reports that he has been successfully scheduled with both initial sessions for psychiatry and counseling. He is looking forward to pursuing behavioral health treatment. Per PCP and patient, he is having issues affording and managing his  medications and a referral for pharmacy care management was successfully placed as well as a referral for disease management for his COPD. Patient was provided education on healthy self-care tools to implement into his daily schedule.   Patient Self Care Activities:  Self administers medications as prescribed Attends all scheduled provider appointments Attends social activities Performs ADL's independently Performs IADL's independently Ability for insight Independent living Motivation for treatment  Patient Coping Strengths:  Hopefulness Self Advocate Able to Communicate Effectively  Patient Self Care Deficits:  Lacks social connections Family is not supportive.  Activities and task to complete in order to accomplish goals.   Please continue to follow up with [facility] to arrange transportation to medical appointments [phone]  EMOTIONAL / MENTAL HEALTH SUPPORT RESOURCE EDUCATION PROIVDED- Review email that was sent today on 11/26/22 Please anticipate call from [facility]  - journal feelings and what helps to feel better or worse - watch for early signs of feeling worse - write in journal every day    Why is this important?   Keeping track of your progress will help your treatment team find the right mix of medicine and therapy for you.  Write in your journal every day.  Day-to-day changes in depression symptoms are normal. It may be more helpful to check your progress at the end of each week instead of every day.       Follow up:  Patient agrees to Care Plan and Follow-up.  Plan: The Managed Medicaid care management team will reach out to the patient again over the next 30 days.  Dickie La, BSW, MSW, LCSW Licensed Clinical  Social Worker [employer]   [email or web address] [email or web address] Direct Dial: [phone]

## 2022-12-10 ENCOUNTER — Telehealth: Payer: Self-pay

## 2022-12-10 NOTE — Progress Notes (Signed)
   Care Guide Note  12/10/2022 Name: Larry Maxwell MRN: [id number] DOB: 1965-03-07  Referred by: Sherlyn Hay, DO Reason for referral : Care Coordination (Outreach to schedule with Pharm d )   Larry Maxwell is a 57 y.o. year old male who is a primary care patient of [place], Monico Blitz, DO. Larry Maxwell was referred to the pharmacist for assistance related to COPD.    Successful contact was made with the patient to discuss pharmacy services including being ready for the pharmacist to call at least 5 minutes before the scheduled appointment time, to have medication bottles and any blood sugar or blood pressure readings ready for review. The patient agreed to meet with the pharmacist via with the pharmacist via telephone visit on (date/time).  12/17/2022  Larry Maxwell, RMA Care Guide [facility]  [place], [place] [address] Direct Dial: [phone] [email or web address]

## 2022-12-13 ENCOUNTER — Encounter: Payer: Medicaid Other | Admitting: *Deleted

## 2022-12-13 DIAGNOSIS — J4489 Other specified chronic obstructive pulmonary disease: Secondary | ICD-10-CM | POA: Diagnosis not present

## 2022-12-13 DIAGNOSIS — J449 Chronic obstructive pulmonary disease, unspecified: Secondary | ICD-10-CM

## 2022-12-13 NOTE — Progress Notes (Signed)
Daily Session Note  Patient Details  Name: Larry Maxwell MRN: [id number] Date of Birth: 10/17/1965 Referring Provider:   Flowsheet Row Pulmonary Rehab from 11/11/2022 in [facility] Cardiac and Pulmonary Rehab  Referring Provider Janann Colonel       Encounter Date: 12/13/2022  Check In:  Session Check In - 12/13/22 0807       Check-In   Supervising physician immediately available to respond to emergencies See telemetry face sheet for immediately available ER MD    Location [facility]-Cardiac & Pulmonary Rehab    Staff Present Balinda Quails, RDN, LDN;Joseph Reino [place], Algernon Huxley, BS, ACSM CEP, Exercise Physiologist;Anandi Abramo Katrinka Blazing, RN, [place]    Virtual Visit No    Medication changes reported     No    Fall or balance concerns reported    No    Warm-up and Cool-down Performed on first and last piece of equipment    Resistance Training Performed Yes    VAD Patient? No    PAD/SET Patient? No      Pain Assessment   Currently in Pain? No/denies                Social History   Tobacco Use  Smoking Status Former   Current packs/day: 0.00   Average packs/day: 1.5 packs/day for 20.0 years (30.0 ttl pk-yrs)   Types: Cigarettes   Start date: 12/19/2001   Quit date: 12/19/2021   Years since quitting: 0.9  Smokeless Tobacco Never    Goals Met:  Independence with exercise equipment Exercise tolerated well No report of concerns or symptoms today Strength training completed today  Goals Unmet:  Not Applicable  Comments: Pt able to follow exercise prescription today without complaint.  Will continue to monitor for progression.    Dr. Bethann Punches is Medical Director for [facility] Cardiac Rehabilitation.  Dr. Vida Rigger is Medical Director for [facility] Pulmonary Rehabilitation.

## 2022-12-14 ENCOUNTER — Encounter: Payer: Medicaid Other | Admitting: *Deleted

## 2022-12-14 DIAGNOSIS — J449 Chronic obstructive pulmonary disease, unspecified: Secondary | ICD-10-CM

## 2022-12-14 DIAGNOSIS — J4489 Other specified chronic obstructive pulmonary disease: Secondary | ICD-10-CM | POA: Diagnosis not present

## 2022-12-14 NOTE — Progress Notes (Signed)
Daily Session Note  Patient Details  Name: Larry Maxwell MRN: [id number] Date of Birth: 1965/02/06 Referring Provider:   Flowsheet Row Pulmonary Rehab from 11/11/2022 in [facility] Card[facility]  Referring Provider Janann Colonel       Encounter Date: 12/14/2022  Check In:  Session Check In - 12/14/22 4098       Check-In   Supervising physician immediately available to respond to emergencies See telemetry face sheet for immediately available ER MD    Location [facility]-Cardiac & Pulmonary Rehab    Staff Present Cora Collum, RN, BSN, CCRP;Margaret Best, MS, Exercise Physiologist;Noah Tickle, BS, Exercise Physiologist;Maxon Conetta BS, , Exercise Physiologist    Virtual Visit No    Medication changes reported     No    Fall or balance concerns reported    No    Warm-up and Cool-down Performed on first and last piece of equipment    Resistance Training Performed Yes    VAD Patient? No    PAD/SET Patient? No      Pain Assessment   Currently in Pain? No/denies                Social History   Tobacco Use  Smoking Status Former   Current packs/day: 0.00   Average packs/day: 1.5 packs/day for 20.0 years (30.0 ttl pk-yrs)   Types: Cigarettes   Start date: 12/19/2001   Quit date: 12/19/2021   Years since quitting: 0.9  Smokeless Tobacco Never    Goals Met:  Proper associated with RPD/PD & O2 Sat [place] with exercise equipment Exercise tolerated well No report of concerns or symptoms today  Goals Unmet:  Not Applicable  Comments: Pt able to follow exercise prescription today without complaint.  Will continue to monitor for progression.    Dr. Bethann Punches is Medical Director for [facility] Cardiac Rehabilitation.  Dr. Vida Rigger is Medical Director for [facility] Pulmonary Rehabilitation.

## 2022-12-15 ENCOUNTER — Other Ambulatory Visit: Payer: Self-pay

## 2022-12-15 ENCOUNTER — Encounter: Payer: Self-pay | Admitting: *Deleted

## 2022-12-15 ENCOUNTER — Encounter: Payer: Self-pay | Admitting: Family Medicine

## 2022-12-15 DIAGNOSIS — G4701 Insomnia due to medical condition: Secondary | ICD-10-CM | POA: Insufficient documentation

## 2022-12-15 DIAGNOSIS — J449 Chronic obstructive pulmonary disease, unspecified: Secondary | ICD-10-CM

## 2022-12-15 NOTE — Progress Notes (Signed)
Pulmonary Individual Treatment Plan  Patient Details  Name: Larry Maxwell MRN: [id number] Date of Birth: 10/21/1965 Referring Provider:   Flowsheet Row Pulmonary Rehab from 11/11/2022 in [facility] Cardiac and Pulmonary Rehab  Referring Provider Jean-Pierre Assaker       Initial Encounter Date:  Flowsheet Row Pulmonary Rehab from 11/11/2022 in [facility] Cardiac and Pulmonary Rehab  Date 11/11/22       Visit Diagnosis: Chronic obstructive pulmonary disease, unspecified COPD type (HCC)  Patient's Home Medications on Admission:  Current Outpatient Medications:    albuterol (VENTOLIN HFA) 108 (90 Base) MCG/ACT inhaler, Inhale 2 puffs into the lungs every 6 (six) hours as needed for wheezing or shortness of breath., Disp: 8 g, Rfl: 0   amLODipine (NORVASC) 10 MG tablet, Take 1 tablet (10 mg total) by mouth daily for 30 doses., Disp: 30 tablet, Rfl: 2   benzonatate (TESSALON) 100 MG capsule, Take 1 capsule (100 mg total) by mouth 2 (two) times daily as needed for cough., Disp: 20 capsule, Rfl: 0   celecoxib (CELEBREX) 100 MG capsule, Take 1 capsule (100 mg total) by mouth 2 (two) times daily., Disp: 60 capsule, Rfl: 2   cetirizine (ZYRTEC) 10 MG tablet, Take 1 tablet (10 mg total) by mouth daily., Disp: 30 tablet, Rfl: 11   chlorthalidone (HYGROTON) 25 MG tablet, TAKE 1 TABLET (25 MG TOTAL) BY MOUTH DAILY., Disp: 90 tablet, Rfl: 0   gabapentin (NEURONTIN) 300 MG capsule, Take 1 tab morning, 1 tab at midday, and 2 tabs before bedtime, Disp: 120 capsule, Rfl: 0   ipratropium-albuterol (DUONEB) 0.5-2.5 (3) MG/3ML SOLN, Take 3 mLs by nebulization every 6 (six) hours as needed., Disp: 360 mL, Rfl: 3   methocarbamol (ROBAXIN) 500 MG tablet, Take 1 tablet (500 mg total) by mouth every 6 (six) hours as needed for muscle spasms., Disp: 20 tablet, Rfl: 0   metoprolol succinate (TOPROL-XL) 25 MG 24 hr tablet, Take 1 tablet (25 mg total) by mouth daily., Disp: 90 tablet, Rfl: 0   montelukast (SINGULAIR) 10 MG  tablet, Take 1 tablet (10 mg total) by mouth at bedtime., Disp: 30 tablet, Rfl: 0   rosuvastatin (CRESTOR) 10 MG tablet, Take 1 tablet (10 mg total) by mouth daily., Disp: 90 tablet, Rfl: 3   sertraline (ZOLOFT) 50 MG tablet, Take 1 tablet (50 mg total) by mouth daily., Disp: 30 tablet, Rfl: 3   valsartan (DIOVAN) 40 MG tablet, Take 1 tablet (40 mg total) by mouth daily. Start taking after one week, if top BP number is >125, Disp: 30 tablet, Rfl: 0  Past Medical History: Past Medical History:  Diagnosis Date   Allergy    Asthma    Depression    Gynecomastia, male 07/21/2022   Hypertension     Tobacco Use: Social History   Tobacco Use  Smoking Status Former   Current packs/day: 0.00   Average packs/day: 1.5 packs/day for 20.0 years (30.0 ttl pk-yrs)   Types: Cigarettes   Start date: 12/19/2001   Quit date: 12/19/2021   Years since quitting: 0.9  Smokeless Tobacco Never    Labs: Review Flowsheet       Latest Ref Rng & Units 07/23/2022  Labs for ITP Cardiac and Pulmonary Rehab  Cholestrol 100 - 199 mg/dL 295   LDL (calc) 0 - 99 mg/dL 284   HDL-C >13 mg/dL 47   Trlycerides 0 - 244 mg/dL 010     Details  Pulmonary Assessment Scores:  Pulmonary Assessment Scores     Row Name 11/11/22 1325         ADL UCSD   ADL Phase Entry     SOB Score total 53     Rest 4     Walk 3     Stairs 3     Bath 4     Dress 2     Shop 0       CAT Score   CAT Score 31       mMRC Score   mMRC Score 2              UCSD: Self-administered rating of dyspnea associated with activities of daily living (ADLs) 6-point scale (0 = "not at all" to 5 = "maximal or unable to do because of breathlessness")  Scoring Scores range from 0 to 120.  Minimally important difference is 5 units  CAT: CAT can identify the health impairment of COPD patients and is better correlated with disease progression.  CAT has a scoring range of zero to 40. The CAT score is classified into  four groups of low (less than 10), medium (10 - 20), high (21-30) and very high (31-40) based on the impact level of disease on health status. A CAT score over 10 suggests significant symptoms.  A worsening CAT score could be explained by an exacerbation, poor medication adherence, poor inhaler technique, or progression of COPD or comorbid conditions.  CAT MCID is 2 points  mMRC: mMRC (Modified Medical Research Council) Dyspnea Scale is used to assess the degree of baseline functional disability in patients of respiratory disease due to dyspnea. No minimal important difference is established. A decrease in score of 1 point or greater is considered a positive change.   Pulmonary Function Assessment:  Pulmonary Function Assessment - 11/08/22 0938       Breath   Shortness of Breath Yes             Exercise Target Goals: Exercise Program Goal: Individual exercise prescription set using results from initial 6 min walk test and THRR while considering  patient's activity barriers and safety.   Exercise Prescription Goal: Initial exercise prescription builds to 30-45 minutes a day of aerobic activity, 2-3 days per week.  Home exercise guidelines will be given to patient during program as part of exercise prescription that the participant will acknowledge.  Education: Aerobic Exercise: - Group verbal and visual presentation on the components of exercise prescription. Introduces F.I.T.T principle from ACSM for exercise prescriptions.  Reviews F.I.T.T. principles of aerobic exercise including progression. Written material given at graduation.   Education: Resistance Exercise: - Group verbal and visual presentation on the components of exercise prescription. Introduces F.I.T.T principle from ACSM for exercise prescriptions  Reviews F.I.T.T. principles of resistance exercise including progression. Written material given at graduation. Flowsheet Row Pulmonary Rehab from 12/09/2022 in [facility] Cardiac  and Pulmonary Rehab  Date 11/18/22  Educator MB  Instruction Review Code 1- [employer] Understanding        Education: Exercise & Equipment Safety: - Individual verbal instruction and demonstration of equipment use and safety with use of the equipment. Flowsheet Row Pulmonary Rehab from 12/09/2022 in [facility] Cardiac and Pulmonary Rehab  Date 11/11/22  Educator NT  Instruction Review Code 1- Verbalizes Understanding       Education: Exercise Physiology & General Exercise Guidelines: - Group verbal and written instruction with models to review the exercise physiology of the cardiovascular system and associated  critical values. Provides general exercise guidelines with specific guidelines to those with heart or lung disease.    Education: Flexibility, Balance, Mind/Body Relaxation: - Group verbal and visual presentation with interactive activity on the components of exercise prescription. Introduces F.I.T.T principle from ACSM for exercise prescriptions. Reviews F.I.T.T. principles of flexibility and balance exercise training including progression. Also discusses the mind body connection.  Reviews various relaxation techniques to help reduce and manage stress (i.e. Deep breathing, progressive muscle relaxation, and visualization). Balance handout provided to take home. Written material given at graduation. Flowsheet Row Pulmonary Rehab from 12/09/2022 in [facility] Cardiac and Pulmonary Rehab  Date 11/18/22  Educator MB  Instruction Review Code 1- Verbalizes Understanding       Activity Barriers & Risk Stratification:  Activity Barriers & Cardiac Risk Stratification - 11/11/22 1332       Activity Barriers & Cardiac Risk Stratification   Activity Barriers Back Problems;Shortness of Breath;Other (comment)    Comments Sciatica L nerve             6 Minute Walk:  6 Minute Walk     Row Name 11/11/22 1329         6 Minute Walk   Phase Initial     Distance 1030 feet     Walk Time  6 minutes     # of Rest Breaks 0     MPH 1.95     METS 3.65     RPE 12     Perceived Dyspnea  2     VO2 Peak 12.78     Symptoms Yes (comment)     Comments cough, chest toghtness/pain 7/10     Resting HR 78 bpm     Resting BP 124/78     Resting Oxygen Saturation  98 %     Exercise Oxygen Saturation  during 6 min walk 94 %     Max Ex. HR 107 bpm     Max Ex. BP 148/84     2 Minute Post BP 128/76       Interval HR   1 Minute HR 107     2 Minute HR 100     3 Minute HR 103     4 Minute HR 99     5 Minute HR 103     6 Minute HR 96     2 Minute Post HR 87     Interval Heart Rate? Yes       Interval Oxygen   Interval Oxygen? Yes     Baseline Oxygen Saturation % 98 %     1 Minute Oxygen Saturation % 98 %     1 Minute Liters of Oxygen 0 L  RA     2 Minute Oxygen Saturation % 99 %     2 Minute Liters of Oxygen 0 L     3 Minute Oxygen Saturation % 98 %     3 Minute Liters of Oxygen 0 L     4 Minute Oxygen Saturation % 94 %     4 Minute Liters of Oxygen 0 L     5 Minute Oxygen Saturation % 97 %     5 Minute Liters of Oxygen 0 L     6 Minute Oxygen Saturation % 98 %     6 Minute Liters of Oxygen 0 L     2 Minute Post Oxygen Saturation % 99 %     2 Minute Post Liters of Oxygen 0  L             Oxygen Initial Assessment:  Oxygen Initial Assessment - 11/08/22 0938       Home Oxygen   Home Oxygen Device None    Sleep Oxygen Prescription None    Home Exercise Oxygen Prescription None    Home Resting Oxygen Prescription None      Initial 6 min Walk   Oxygen Used None      Program Oxygen Prescription   Program Oxygen Prescription None      Intervention   Short Term Goals To learn and understand importance of maintaining oxygen saturations>88%;To learn and understand importance of monitoring SPO2 with pulse oximeter and demonstrate accurate use of the pulse oximeter.;To learn and demonstrate proper pursed lip breathing techniques or other breathing techniques. ;To learn and  demonstrate proper use of respiratory medications    Long  Term Goals Verbalizes importance of monitoring SPO2 with pulse oximeter and return demonstration;Maintenance of O2 saturations>88%;Exhibits proper breathing techniques, such as pursed lip breathing or other method taught during program session;Compliance with respiratory medication;Demonstrates proper use of MDI's             Oxygen Re-Evaluation:  Oxygen Re-Evaluation     Row Name 11/16/22 1610 12/02/22 0738           Program Oxygen Prescription   Program Oxygen Prescription None None        Home Oxygen   Home Oxygen Device None None      Sleep Oxygen Prescription None None      Home Exercise Oxygen Prescription None None      Home Resting Oxygen Prescription None None      Compliance with Home Oxygen Use Yes --        Goals/Expected Outcomes   Short Term Goals To learn and demonstrate proper pursed lip breathing techniques or other breathing techniques.  To learn and demonstrate proper pursed lip breathing techniques or other breathing techniques.       Long  Term Goals Exhibits proper breathing techniques, such as pursed lip breathing or other method taught during program session Exhibits proper breathing techniques, such as pursed lip breathing or other method taught during program session      Comments Reviewed PLB technique with pt.  Talked about how it works and it's importance in maintaining their exercise saturations. Informed patient how to perform the Pursed Lipped breathing technique. Told patient to Inhale through the nose and out the mouth with pursed lips to keep their airways open, help oxygenate them better, practice when at rest or doing strenuous activity. Patient Verbalizes understanding of technique and will work on and be reiterated during [facility].      Goals/Expected Outcomes Short: Become more profiecient at using PLB. Long: Become independent at using PLB. Short: use PLB with exertion. Long: use PLB  on exertion proficiently and independently.               Oxygen Discharge (Final Oxygen Re-Evaluation):  Oxygen Re-Evaluation - 12/02/22 0738       Program Oxygen Prescription   Program Oxygen Prescription None      Home Oxygen   Home Oxygen Device None    Sleep Oxygen Prescription None    Home Exercise Oxygen Prescription None    Home Resting Oxygen Prescription None      Goals/Expected Outcomes   Short Term Goals To learn and demonstrate proper pursed lip breathing techniques or other breathing techniques.  Long  Term Goals Exhibits proper breathing techniques, such as pursed lip breathing or other method taught during program session    Comments Informed patient how to perform the Pursed Lipped breathing technique. Told patient to Inhale through the nose and out the mouth with pursed lips to keep their airways open, help oxygenate them better, practice when at rest or doing strenuous activity. Patient Verbalizes understanding of technique and will work on and be reiterated during [facility].    Goals/Expected Outcomes Short: use PLB with exertion. Long: use PLB on exertion proficiently and independently.             Initial Exercise Prescription:  Initial Exercise Prescription - 11/11/22 1300       Date of Initial Exercise RX and Referring Provider   Date 11/11/22    Referring Provider [place] Assaker      Oxygen   Maintain Oxygen Saturation 88% or higher      Treadmill   MPH 1.8    Grade 0.5    Minutes 15    METs 2.5      NuStep   Level 2   T6 nustep   SPM 80    Minutes 15    METs 3.65      REL-XR   Level 3    Speed 50    Minutes 15    METs 3.65      Prescription Details   Frequency (times per week) 2    Duration Progress to 30 minutes of continuous aerobic without signs/symptoms of physical distress      Intensity   THRR 40-80% of Max Heartrate 112-146    Ratings of Perceived Exertion 11-13    Perceived Dyspnea 0-4      Progression    Progression Continue to progress workloads to maintain intensity without signs/symptoms of physical distress.      Resistance Training   Training Prescription Yes    Weight 4 lb    Reps 10-15             Perform Capillary Blood Glucose checks as needed.  Exercise Prescription Changes:   Exercise Prescription Changes     Row Name 11/11/22 1300 12/02/22 1600           Response to Exercise   Blood Pressure (Admit) 124/78 134/74      Blood Pressure (Exercise) 148/84 178/100      Blood Pressure (Exit) 128/76 142/90      Heart Rate (Admit) 78 bpm 61 bpm      Heart Rate (Exercise) 107 bpm 110 bpm      Heart Rate (Exit) 87 bpm 63 bpm      Oxygen Saturation (Admit) 98 % 97 %      Oxygen Saturation (Exercise) 94 % 96 %      Oxygen Saturation (Exit) 99 % 98 %      Rating of Perceived Exertion (Exercise) 12 15      Perceived Dyspnea (Exercise) 2 2      Symptoms cogh, chest tightness 7/10 none      Comments Results --      Duration -- Progress to 30 minutes of  aerobic without signs/symptoms of physical distress      Intensity -- THRR unchanged        Progression   Progression -- Continue to progress workloads to maintain intensity without signs/symptoms of physical distress.      Average METs -- 1.9        Resistance  Training   Training Prescription -- Yes      Weight -- 4 lb      Reps -- 10-15        Interval Training   Interval Training -- No        Treadmill   MPH -- 1.5      Grade -- 0.5      Minutes -- 15      METs -- 2.25        NuStep   Level -- 1  T6      Minutes -- 15      METs -- 1.6        REL-XR   Level -- 1      Minutes -- 15        Oxygen   Maintain Oxygen Saturation -- 88% or higher               Exercise Comments:   Exercise Comments     Row Name 11/16/22 0831           Exercise Comments First full day of exercise!  Patient was oriented to gym and equipment including functions, settings, policies, and procedures.   Patient's individual exercise prescription and treatment plan were reviewed.  All starting workloads were established based on the results of the 6 minute walk test done at initial orientation visit.  The plan for exercise progression was also introduced and progression will be customized based on patient's performance and goals.                Exercise Goals and Review:   Exercise Goals     Row Name 11/11/22 1332             Exercise Goals   Increase Physical Activity Yes       Intervention Provide advice, education, support and counseling about physical activity/exercise needs.;Develop an individualized exercise prescription for aerobic and resistive training based on initial evaluation findings, risk stratification, comorbidities and participant's personal goals.       Expected Outcomes Short Term: Attend rehab on a regular basis to increase amount of physical activity.;Long Term: Add in home exercise to make exercise part of routine and to increase amount of physical activity.;Long Term: Exercising regularly at least 3-5 days a week.       Increase Strength and Stamina Yes       Intervention Provide advice, education, support and counseling about physical activity/exercise needs.;Develop an individualized exercise prescription for aerobic and resistive training based on initial evaluation findings, risk stratification, comorbidities and participant's personal goals.       Expected Outcomes Short Term: Increase workloads from initial exercise prescription for resistance, speed, and METs.;Short Term: Perform resistance training exercises routinely during rehab and add in resistance training at home;Long Term: Improve cardiorespiratory fitness, muscular endurance and strength as measured by increased METs and functional capacity ( )       Able to understand and use rate of perceived exertion (RPE) scale Yes       Intervention Provide education and explanation on how to use RPE scale        Expected Outcomes Short Term: Able to use RPE daily in rehab to express subjective intensity level;Long Term:  Able to use RPE to guide intensity level when exercising independently       Able to understand and use Dyspnea scale Yes       Intervention Provide education and explanation on how to use Dyspnea scale  Expected Outcomes Short Term: Able to use Dyspnea scale daily in rehab to express subjective sense of shortness of breath during exertion;Long Term: Able to use Dyspnea scale to guide intensity level when exercising independently       Knowledge and understanding of Target Heart Rate Range (THRR) Yes       Intervention Provide education and explanation of THRR including how the numbers were predicted and where they are located for reference       Expected Outcomes Short Term: Able to state/look up THRR;Long Term: Able to use THRR to govern intensity when exercising independently;Short Term: Able to use daily as guideline for intensity in rehab       Able to check pulse independently Yes       Intervention Provide education and demonstration on how to check pulse in carotid and radial arteries.;Review the importance of being able to check your own pulse for safety during independent exercise       Expected Outcomes Short Term: Able to explain why pulse checking is important during independent exercise;Long Term: Able to check pulse independently and accurately       Understanding of Exercise Prescription Yes       Intervention Provide education, explanation, and written materials on patient's individual exercise prescription       Expected Outcomes Short Term: Able to explain program exercise prescription;Long Term: Able to explain home exercise prescription to exercise independently                Exercise Goals Re-Evaluation :  Exercise Goals Re-Evaluation     Row Name 11/16/22 0831 12/02/22 1628           Exercise Goal Re-Evaluation   Exercise Goals Review -- Increase  Physical Activity;Increase Strength and Stamina;Understanding of Exercise Prescription      Comments Reviewed RPE and dyspnea scale, THR and program prescription with pt today.  Pt voiced understanding and was given a copy of goals to take home. Froylan is off to a great start in the program. He has been getting familiar with his exercise prescription, and the machines involved. He was able to use the readmill at a speed of 1.5 mph and an incline of 0.5%, and use the T6 nustep and XR at level 1. We will continue to monitor his progress in the program.      Expected Outcomes Short: Use RPE daily to regulate intensity. Long: Follow program prescription in THR. Short: Continue to follow current exercise prescription. Long: Continue exercise to improve strength and stamina.               Discharge Exercise Prescription (Final Exercise Prescription Changes):  Exercise Prescription Changes - 12/02/22 1600       Response to Exercise   Blood Pressure (Admit) 134/74    Blood Pressure (Exercise) 178/100    Blood Pressure (Exit) 142/90    Heart Rate (Admit) 61 bpm    Heart Rate (Exercise) 110 bpm    Heart Rate (Exit) 63 bpm    Oxygen Saturation (Admit) 97 %    Oxygen Saturation (Exercise) 96 %    Oxygen Saturation (Exit) 98 %    Rating of Perceived Exertion (Exercise) 15    Perceived Dyspnea (Exercise) 2    Symptoms none    Duration Progress to 30 minutes of  aerobic without signs/symptoms of physical distress    Intensity THRR unchanged      Progression   Progression Continue to progress workloads to maintain intensity  without signs/symptoms of physical distress.    Average METs 1.9      Resistance Training   Training Prescription Yes    Weight 4 lb    Reps 10-15      Interval Training   Interval Training No      Treadmill   MPH 1.5    Grade 0.5    Minutes 15    METs 2.25      NuStep   Level 1   T6   Minutes 15    METs 1.6      REL-XR   Level 1    Minutes 15      Oxygen    Maintain Oxygen Saturation 88% or higher             Nutrition:  Target Goals: Understanding of nutrition guidelines, daily intake of sodium 1500mg , cholesterol 200mg , calories 30% from fat and 7% or less from saturated fats, daily to have 5 or more servings of fruits and vegetables.  Education: All About Nutrition: -Group instruction provided by verbal, written material, interactive activities, discussions, models, and posters to present general guidelines for heart healthy nutrition including fat, fiber, MyPlate, the role of sodium in heart healthy nutrition, utilization of the nutrition label, and utilization of this knowledge for meal planning. Follow up email sent as well. Written material given at graduation. Flowsheet Row Pulmonary Rehab from 12/09/2022 in [facility] Cardiac and Pulmonary Rehab  Education need identified 11/11/22  Date 12/09/22  Educator JG  Instruction Review Code 1- Verbalizes Understanding       Biometrics:  Pre Biometrics - 11/11/22 1333       Pre Biometrics   Height 6\' 1"  (1.854 m)    Weight 190 lb 8 oz (86.4 kg)    Waist Circumference 40 inches    Hip Circumference 41 inches    Waist to Hip Ratio 0.98 %    BMI (Calculated) 25.14    Single Leg Stand 10.4 seconds              Nutrition Therapy Plan and Nutrition Goals:  Nutrition Therapy & Goals - 11/16/22 0947       Nutrition Therapy   Diet Mediterranean    Protein (specify units) 75-90    Fiber 30 grams    Whole Grain Foods 3 servings    Saturated Fats 15 max. grams    Fruits and Vegetables 5 servings/day    Sodium 2 grams      Personal Nutrition Goals   Nutrition Goal Eat something balanced at every meal hour    Personal Goal #2 Include healthy fats to help promote health and meet calorie needs    Personal Goal #3 Meet with RD again in late Nov    Comments Patient drinking 64oz of water daily. He wants to learn how to adopt a healthy eating style. Reviewed mediterranean diet  handout with him, educating on types of fats, sources, and how to read labels. He is inconsistent with eating, sometimes overeating, or snacking on junk, sometimes missing meals. Explained the importance of consistency and structure. Built out several meals and snacks with foods he likes and will eat focusing on nutrient and calorie dense foods that could be eating in small quantities with little prep work to help him be more consistent at meeting nutritional needs even when his appetite is poor. Explained that this will require continued work, he has agreed to meet again in ~70month to reassess and work through any barriers that  arise while making these changes.      Intervention Plan   Intervention Prescribe, educate and counsel regarding individualized specific dietary modifications aiming towards targeted core components such as weight, hypertension, lipid management, diabetes, heart failure and other comorbidities.;Nutrition handout(s) given to patient.    Expected Outcomes Short Term Goal: Understand basic principles of dietary content, such as calories, fat, sodium, cholesterol and nutrients.;Short Term Goal: A plan has been developed with personal nutrition goals set during dietitian appointment.;Long Term Goal: Adherence to prescribed nutrition plan.             Nutrition Assessments:  MEDIFICTS Score Key: >=70 Need to make dietary changes  40-70 Heart Healthy Diet <= 40 Therapeutic Level Cholesterol Diet  Flowsheet Row Pulmonary Rehab from 11/11/2022 in [facility]  Picture Your Plate Total Score on Admission 41      Picture Your Plate Scores: <11 Unhealthy dietary pattern with much room for improvement. 41-50 Dietary pattern unlikely to meet recommendations for good health and room for improvement. 51-60 More healthful dietary pattern, with some room for improvement.  >60 Healthy dietary pattern, although there may be some specific behaviors that could be  improved.   Nutrition Goals Re-Evaluation:  Nutrition Goals Re-Evaluation     Row Name 12/02/22 0745             Goals   Current Weight 186 lb (84.4 kg)       Comment Patient was informed on why it is important to maintain a balanced diet when dealing with Respiratory issues. Explained that it takes a lot of energy to breath and when they are short of breath often they will need to have a good diet to help keep up with the calories they are expending for breathing.       Expected Outcome Short: Choose and plan snacks accordingly to patients caloric intake to improve breathing. Long: Maintain a diet independently that meets their caloric intake to aid in daily shortness of breath.                Nutrition Goals Discharge (Final Nutrition Goals Re-Evaluation):  Nutrition Goals Re-Evaluation - 12/02/22 0745       Goals   Current Weight 186 lb (84.4 kg)    Comment Patient was informed on why it is important to maintain a balanced diet when dealing with Respiratory issues. Explained that it takes a lot of energy to breath and when they are short of breath often they will need to have a good diet to help keep up with the calories they are expending for breathing.    Expected Outcome Short: Choose and plan snacks accordingly to patients caloric intake to improve breathing. Long: Maintain a diet independently that meets their caloric intake to aid in daily shortness of breath.             Psychosocial: Target Goals: Acknowledge presence or absence of significant depression and/or stress, maximize coping skills, provide positive support system. Participant is able to verbalize types and ability to use techniques and skills needed for reducing stress and depression.   Education: Stress, Anxiety, and Depression - Group verbal and visual presentation to define topics covered.  Reviews how body is impacted by stress, anxiety, and depression.  Also discusses healthy ways to reduce stress  and to treat/manage anxiety and depression.  Written material given at graduation.   Education: Sleep Hygiene -Provides group verbal and written instruction about how sleep can affect your health.  Define sleep hygiene, discuss sleep cycles and impact of sleep habits. Review good sleep hygiene tips.    Initial Review & Psychosocial Screening:  Initial Psych Review & Screening - 11/08/22 0940       Initial Review   Current issues with Current Sleep Concerns;Current Anxiety/Panic      Family Dynamics   Good Support System? No    Strains Intra-family strains    Comments He states he does not have a great family support system. He preferes to deal with his stressors himself. At night he has trouble sleeping and gets shot of breath.      Barriers   Psychosocial barriers to participate in program The patient should benefit from training in stress management and relaxation.;There are no identifiable barriers or psychosocial needs.      Screening Interventions   Interventions Encouraged to exercise;To provide support and resources with identified psychosocial needs;Provide feedback about the scores to participant    Expected Outcomes Short Term goal: Utilizing psychosocial counselor, staff and physician to assist with identification of specific Stressors or current issues interfering with healing process. Setting desired goal for each stressor or current issue identified.;Long Term Goal: Stressors or current issues are controlled or eliminated.;Short Term goal: Identification and review with participant of any Quality of Life or Depression concerns found by scoring the questionnaire.;Long Term goal: The participant improves quality of Life and PHQ9 Scores as seen by post scores and/or verbalization of changes             Quality of Life Scores:  Scores of 19 and below usually indicate a poorer quality of life in these areas.  A difference of  2-3 points is a clinically meaningful  difference.  A difference of 2-3 points in the total score of the Quality of Life Index has been associated with significant improvement in overall quality of life, self-image, physical symptoms, and general health in studies assessing change in quality of life.  PHQ-9: Review Flowsheet  More data exists      12/03/2022 12/02/2022 11/26/2022 11/22/2022 11/11/2022  Depression screen PHQ 2/9  Decreased Interest 2 2 2 2 2   Down, Depressed, Hopeless 2 2 2 3 3   PHQ - 2 Score 4 4 4 5 5   Altered sleeping 3 3 3 3 3   Tired, decreased energy 3 2 2 1 2   Change in appetite 2 0 0 0 1  Feeling bad or failure about yourself  2 0 0 0 1  Trouble concentrating 2 0 0 - 0  Moving slowly or fidgety/restless 1 0 0 0 1  Suicidal thoughts 1 0 0 1 1  PHQ-9 Score 18 9 9 10 14   Difficult doing work/chores - Somewhat difficult - - Somewhat difficult    Details           Interpretation of Total Score  Total Score Depression Severity:  1-4 = Minimal depression, 5-9 = Mild depression, 10-14 = Moderate depression, 15-19 = Moderately severe depression, 20-27 = Severe depression   Psychosocial Evaluation and Intervention:  Psychosocial Evaluation - 11/08/22 0945       Psychosocial Evaluation & Interventions   Interventions Encouraged to exercise with the program and follow exercise prescription;Relaxation education;Stress management education    Comments He states he does not have a great family support system. He preferes to deal with his stressors himself. At night he has trouble sleeping and gets shot of breath.    Expected Outcomes Short: Start [facility] to help with mood. Long:  Maintain a healthy mental state.    Continue Psychosocial Services  Follow up required by staff             Psychosocial Re-Evaluation:  Psychosocial Re-Evaluation     Row Name 12/02/22 [phone]             Psychosocial Re-Evaluation   Current issues with Current Stress Concerns       Comments Reviewed patient health  questionnaire (PHQ-9) with patient for follow up. Previously, patients score indicated signs/symptoms of depression.  Reviewed to see if patient is improving symptom wise while in program.  Score improved and patient states that it is because he is taking care of his health more and seeing a therapist.       Expected Outcomes Short: Continue to attend [facility] regularly for regular exercise and social engagement. Long: Continue to improve symptoms and manage a positive mental state.       Interventions Encouraged to attend Pulmonary Rehabilitation for the exercise       Continue Psychosocial Services  Follow up required by staff                Psychosocial Discharge (Final Psychosocial Re-Evaluation):  Psychosocial Re-Evaluation - 12/02/22 0748       Psychosocial Re-Evaluation   Current issues with Current Stress Concerns    Comments Reviewed patient health questionnaire (PHQ-9) with patient for follow up. Previously, patients score indicated signs/symptoms of depression.  Reviewed to see if patient is improving symptom wise while in program.  Score improved and patient states that it is because he is taking care of his health more and seeing a therapist.    Expected Outcomes Short: Continue to attend [facility] regularly for regular exercise and social engagement. Long: Continue to improve symptoms and manage a positive mental state.    Interventions Encouraged to attend Pulmonary Rehabilitation for the exercise    Continue Psychosocial Services  Follow up required by staff             Education: Education Goals: Education classes will be provided on a weekly basis, covering required topics. Participant will state understanding/return demonstration of topics presented.  Learning Barriers/Preferences:  Learning Barriers/Preferences - 11/08/22 0939       Learning Barriers/Preferences   Learning Barriers None    Learning Preferences None             General Pulmonary  Education Topics:  Infection Prevention: - Provides verbal and written material to individual with discussion of infection control including proper hand washing and proper equipment cleaning during exercise session. Flowsheet Row Pulmonary Rehab [facility] 12[facility]09/[facility]  Date 11/11/22  Educator NT  Instruction Review Code 1- Verbalizes Understanding       Falls Prevention: - Provides verbal and written material to individual with discussion of falls prevention and safety. Flowsheet Row Pulmonary Rehab from 12/09/2022 in [facility] Cardiac and Pulmonary Rehab  Date 11/11/22  Educator NT  Instruction Review Code 1- Verbalizes Understanding       Chronic Lung Disease Review: - Group verbal instruction with posters, models, PowerPoint presentations and videos,  to review new updates, new respiratory medications, new advancements in procedures and treatments. Providing information on websites and "800" numbers for continued self-education. Includes information about supplement oxygen, available portable oxygen systems, continuous and intermittent flow rates, oxygen safety, concentrators, and Medicare reimbursement for oxygen. Explanation of Pulmonary Drugs, including class, frequency, complications, importance of spacers, rinsing mouth after steroid MDI's, and proper  cleaning methods for nebulizers. Review of basic lung anatomy and physiology related to function, structure, and complications of lung disease. Review of risk factors. Discussion about methods for diagnosing sleep apnea and types of masks and machines for OSA. Includes a review of the use of types of environmental controls: home humidity, furnaces, filters, dust mite/pet prevention, HEPA vacuums. Discussion about weather changes, air quality and the benefits of nasal washing. Instruction on Warning signs, infection symptoms, calling MD promptly, preventive modes, and value of vaccinations. Review of effective  airway clearance, coughing and/or vibration techniques. Emphasizing that all should Create an Action Plan. Written material given at graduation. Flowsheet Row Pulmonary Rehab from 12/09/2022 in [facility] Cardiac and Pulmonary Rehab  Education need identified 11/11/22       AED/CPR: - Group verbal and written instruction with the use of models to demonstrate the basic use of the AED with the basic ABC's of resuscitation.    Anatomy and Cardiac Procedures: - Group verbal and visual presentation and models provide information about basic cardiac anatomy and function. Reviews the testing methods done to diagnose heart disease and the outcomes of the test results. Describes the treatment choices: Medical Management, Angioplasty, or Coronary Bypass Surgery for treating various heart conditions including Myocardial Infarction, Angina, Valve Disease, and Cardiac Arrhythmias.  Written material given at graduation.   Medication Safety: - Group verbal and visual instruction to review commonly prescribed medications for heart and lung disease. Reviews the medication, class of the drug, and side effects. Includes the steps to properly store meds and maintain the prescription regimen.  Written material given at graduation.   Other: -Provides group and verbal instruction on various topics (see comments)   Knowledge Questionnaire Score:  Knowledge Questionnaire Score - 11/11/22 1324       Knowledge Questionnaire Score   Pre Score 15/18              Core Components/Risk Factors/Patient Goals at Admission:  Personal Goals and Risk Factors at Admission - 11/08/22 0939       Core Components/Risk Factors/Patient Goals on Admission    Weight Management Yes;Weight Maintenance    Intervention Weight Management: Develop a combined nutrition and exercise program designed to reach desired caloric intake, while maintaining appropriate intake of nutrient and fiber, sodium and fats, and appropriate energy  expenditure required for the weight goal.;Weight Management: Provide education and appropriate resources to help participant work on and attain dietary goals.;Weight Management/Obesity: Establish reasonable short term and long term weight goals.    Expected Outcomes Short Term: Continue to assess and modify interventions until short term weight is achieved;Long Term: Adherence to nutrition and physical activity/exercise program aimed toward attainment of established weight goal;Weight Maintenance: Understanding of the daily nutrition guidelines, which includes 25-35% calories from fat, 7% or less cal from saturated fats, less than 200mg  cholesterol, less than 1.5gm of sodium, & 5 or more servings of fruits and vegetables daily;Understanding recommendations for meals to include 15-35% energy as protein, 25-35% energy from fat, 35-60% energy from carbohydrates, less than 200mg  of dietary cholesterol, 20-35 gm of total fiber daily;Understanding of distribution of calorie intake throughout the day with the consumption of 4-5 meals/snacks    Improve shortness of breath with ADL's Yes    Intervention Provide education, individualized exercise plan and daily activity instruction to help decrease symptoms of SOB with activities of daily living.    Expected Outcomes Short Term: Improve cardiorespiratory fitness to achieve a reduction of symptoms when performing ADLs;Long Term: Be  able to perform more ADLs without symptoms or delay the onset of symptoms    Hypertension Yes    Intervention Provide education on lifestyle modifcations including regular physical activity/exercise, weight management, moderate sodium restriction and increased consumption of fresh fruit, vegetables, and low fat dairy, alcohol moderation, and smoking cessation.;Monitor prescription use compliance.    Expected Outcomes Short Term: Continued assessment and intervention until BP is < 140/59mm HG in hypertensive participants. < 130/72mm HG in  hypertensive participants with diabetes, heart failure or chronic kidney disease.;Long Term: Maintenance of blood pressure at goal levels.    Lipids Yes    Intervention Provide education and support for participant on nutrition & aerobic/resistive exercise along with prescribed medications to achieve LDL 70mg , HDL >40mg .    Expected Outcomes Short Term: Participant states understanding of desired cholesterol values and is compliant with medications prescribed. Participant is following exercise prescription and nutrition guidelines.;Long Term: Cholesterol controlled with medications as prescribed, with individualized exercise RX and with personalized nutrition plan. Value goals: LDL < 70mg , HDL > 40 mg.             Education:Diabetes - Individual verbal and written instruction to review signs/symptoms of diabetes, desired ranges of glucose level fasting, after meals and with exercise. Acknowledge that pre and post exercise glucose checks will be done for 3 sessions at entry of program.   Know Your Numbers and Heart Failure: - Group verbal and visual instruction to discuss disease risk factors for cardiac and pulmonary disease and treatment options.  Reviews associated critical values for Overweight/Obesity, Hypertension, Cholesterol, and Diabetes.  Discusses basics of heart failure: signs/symptoms and treatments.  Introduces Heart Failure Zone chart for action plan for heart failure.  Written material given at graduation.   Core Components/Risk Factors/Patient Goals Review:   Goals and Risk Factor Review     Row Name 12/02/22 0744             Core Components/Risk Factors/Patient Goals Review   Personal Goals Review Improve shortness of breath with ADL's       Review Spoke to patient about their shortness of breath and what they can do to improve. Patient has been informed of breathing techniques when starting the program. Patient is informed to tell staff if they have had any med  changes and that certain meds they are taking or not taking can be causing shortness of breath.       Expected Outcomes Short: Attend [facility] regularly to improve shortness of breath with ADL's. Long: maintain independence with ADL's                Core Components/Risk Factors/Patient Goals at Discharge (Final Review):   Goals and Risk Factor Review - 12/02/22 0744       Core Components/Risk Factors/Patient Goals Review   Personal Goals Review Improve shortness of breath with ADL's    Review Spoke to patient about their shortness of breath and what they can do to improve. Patient has been informed of breathing techniques when starting the program. Patient is informed to tell staff if they have had any med changes and that certain meds they are taking or not taking can be causing shortness of breath.    Expected Outcomes Short: Attend [facility] regularly to improve shortness of breath with ADL's. Long: maintain independence with ADL's             ITP Comments:  ITP Comments     Row Name 11/08/22 [id number] 11/11/22 1322 11/16/22 0831  11/24/22 1244 12/15/22 1140   ITP Comments Virtual Visit completed. Patient informed on EP and RD appointment and 6 Minute walk test. Patient also informed of patient health questionnaires on My Chart. Patient Verbalizes understanding. Visit diagnosis can be found in [facility] 10/14/2022. Completed and gym orientation. Initial ITP created and sent for review to Dr. Jinny Sanders, Medical Director. First full day of exercise!  Patient was oriented to gym and equipment including functions, settings, policies, and procedures.  Patient's individual exercise prescription and treatment plan were reviewed.  All starting workloads were established based on the results of the 6 minute walk test done at initial orientation visit.  The plan for exercise progression was also introduced and progression will be customized based on patient's performance and goals. 30 Day review  completed. Medical Director ITP review done, changes made as directed, and signed approval by Medical Director.    new to prgram 30 Day review completed. Medical Director ITP review done, changes made as directed, and signed approval by Medical Director.            Comments:

## 2022-12-15 NOTE — Assessment & Plan Note (Signed)
Blood pressure mildly elevated today. Patient reports running out of blood pressure medications. Last dose taken on Wednesday. Blood pressure was well-controlled while on medication. - Per discussion with social worker, because he has Medicaid now, patient should be able to get his medications even if he is unable to pay for them; he will simply have to pay when he is able. - Advised patient to pick up refills of his blood pressure medications as soon as possible.

## 2022-12-15 NOTE — Patient Outreach (Signed)
Medicaid Managed Care Social Work Note  12/15/2022 Name:  Larry Maxwell MRN:  [id number] DOB:  September 15, 1965  Larry Maxwell is an 57 y.o. year old male who is a primary patient of Sherlyn Hay, DO.  The [facility] Managed Care Coordination team was consulted for assistance with:   housing  Larry Maxwell was given information about Medicaid Managed Care Coordination team services today. Larry Maxwell Patient agreed to services and verbal consent obtained.  Engaged with patient  for by telephone forinitial visit in response to referral for case management and/or care coordination services.   Assessments/Interventions:  Review of past medical history, allergies, medications, health status, including review of consultants reports, laboratory and other test data, was performed as part of comprehensive evaluation and provision of chronic care management services.  SDOH: (Social Determinant of Health) assessments and interventions performed: SDOH Interventions    Flowsheet Row Patient Outreach Telephone from 12/09/2022 in [place] POPULATION HEALTH DEPARTMENT Pulmonary Rehab from 12/02/2022 in [facility] Cardiac and Pulmonary Rehab Patient Outreach Telephone from 11/26/2022 in [place] POPULATION HEALTH DEPARTMENT Care Coordination from 11/22/2022 in [facility] Community Care Coordination Office Visit from 08/04/2022 in [facility] Family Practice Office Visit from 07/21/2022 in [facility] Family Practice  SDOH Interventions        Food Insecurity Interventions -- -- -- Intervention Not Indicated -- --  Housing Interventions -- -- -- Intervention Not Indicated -- --  Transportation Interventions Payor Benefit -- -- SCAT Psychologist, clinical) -- --  Depression Interventions/Treatment  -- Counseling Referral to Psychiatry, Medication, Counseling Referral to Psychiatry, Medication, Counseling Currently on Treatment Medication  Stress Interventions  -- -- [employer], Provide Counseling -- -- --     BSW completed a telephone outreach with patient, he states he is currently living with his sister and she will be moving soon. Patient states he does not have any income he has applied for disability and is in the last step of getting the approval. Patient states he wants his own place. He has looked into apartments in the area. BSW will email patient resources for housing to [email or web address]  Advanced Directives Status:  Not addressed in this encounter.  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) 10/25/2022   Restrictive lung disease 08/25/2022   Paresthesia of right upper extremity 08/17/2022   History of diverticulosis 08/09/2022   Nicotine dependence with current use 08/04/2022   Prediabetes 08/02/2022   Hypertension 07/21/2022   Asthma, allergic, mild persistent, uncomplicated 07/21/2022   Depression, major, single episode, moderate (HCC) 07/21/2022   Mixed hyperlipidemia 07/21/2022   Chronic midline low back pain with left-sided sciatica 07/21/2022    Conditions to be addressed/monitored per PCP order:   housing  There are no care plans that you recently modified to display for this patient.   Follow up:  Patient agrees to Care Plan and Follow-up.  Plan: The Managed Medicaid care management team will reach out to the patient  again over the next 30 days.  Date/time of next scheduled Social Work care management/care coordination outreach:  01/14/23  [name], [name], [facility]   Managed [facility] Social Worker [phone]

## 2022-12-15 NOTE — Assessment & Plan Note (Signed)
Patient reports early morning awakenings and difficulty returning to sleep. No issues with falling asleep initially. Patient acknowledges napping after rehab sessions. - Advise patient to stay try to active and avoid napping during the day to help adjust sleep schedule.  Acknowledged but this will be extremely difficult due to him becoming very easily fatigued from physical activity due to his severe COPD.

## 2022-12-15 NOTE — Assessment & Plan Note (Signed)
Patient reports improved breathing with nebulizer use and pulmonary rehab. Rehab frequency increasing to three times a week. Patient also performing exercises at home and learning breathing techniques. -Continue current regimen of nebulizer use and pulmonary rehab. Patient sees pulmonology; will defer to their management

## 2022-12-15 NOTE — Patient Instructions (Signed)
Visit Information  Larry Maxwell was given information about Medicaid Managed Care team care coordination services as a part of their [place] Medicaid benefit. Larry Maxwell verbally consented to engagement with the [facility] Managed Care team.   If you are experiencing a medical emergency, please call 911 or report to your local emergency department or urgent care.   If you have a non-emergency medical problem during routine business hours, please contact your provider's office and ask to speak with a nurse.   For questions related to your [place] health plan, please call: [phone]  If you would like to schedule transportation through your [place] plan, please call the following number at least 2 days in advance of your appointment: [phone].   There is no limit to the number of trips during the year between medical appointments, healthcare facilities, or pharmacies. Transportation must be scheduled at least 2 business days before but not more than thirty 30 days before of your appointment.  Call the Behavioral Health Crisis Line at [phone], at any time, 24 hours a day, 7 days a week. If you are in danger or need immediate medical attention call 911.  If you would like help to quit smoking, call 1-800-QUIT-NOW (808-623-0505) OR Espaol: 1-855-Djelo-Ya (3-875-643-3295) o para ms informacin haga clic aqu or Text READY to 188-416 to register via text  [name] - following are the goals we discussed in your visit today:     Social Worker will follow up in 30 days.   Larry Maxwell, Larry Maxwell, MHA [facility]  Managed Medicaid Social Worker [phone]   Following is a copy of your plan of care:  There are no care plans that you recently modified to display for this patient.

## 2022-12-16 ENCOUNTER — Encounter: Payer: Medicaid Other | Admitting: *Deleted

## 2022-12-16 DIAGNOSIS — J449 Chronic obstructive pulmonary disease, unspecified: Secondary | ICD-10-CM

## 2022-12-16 DIAGNOSIS — J4489 Other specified chronic obstructive pulmonary disease: Secondary | ICD-10-CM | POA: Diagnosis not present

## 2022-12-16 NOTE — Progress Notes (Signed)
Daily Session Note  Patient Details  Name: Larry Maxwell MRN: [id number] Date of Birth: Nov 04, 1965 Referring Provider:   Flowsheet Row Pulmonary Rehab from 11/11/2022 in [facility] Cardiac and Pulmonary Rehab  Referring Provider Janann Colonel       Encounter Date: 12/16/2022  Check In:  Session Check In - 12/16/22 0743       Check-In   Supervising physician immediately available to respond to emergencies See telemetry face sheet for immediately available ER MD    Location [facility]-Cardiac & Pulmonary Rehab    Staff Present Larry Hurst, RN, ADN;Larry Maxwell BS, , Exercise Physiologist;Larry Maxwell, RCP,RRT,BSRT;Larry Maxwell, BS, Exercise Physiologist    Virtual Visit No    Medication changes reported     No    Fall or balance concerns reported    No    Warm-up and Cool-down Performed on first and last piece of equipment    Resistance Training Performed Yes    VAD Patient? No    PAD/SET Patient? No      Pain Assessment   Currently in Pain? No/denies                Social History   Tobacco Use  Smoking Status Former   Current packs/day: 0.00   Average packs/day: 1.5 packs/day for 20.0 years (30.0 ttl pk-yrs)   Types: Cigarettes   Start date: 12/19/2001   Quit date: 12/19/2021   Years since quitting: 0.9  Smokeless Tobacco Never    Goals Met:  Independence with exercise equipment Exercise tolerated well No report of concerns or symptoms today Strength training completed today  Goals Unmet:  Not Applicable  Comments: Pt able to follow exercise prescription today without complaint.  Will continue to monitor for progression.    Dr. Bethann Punches is Medical Director for [facility] Cardiac Rehabilitation.  Dr. Vida Rigger is Medical Director for [facility] Pulmonary Rehabilitation.

## 2022-12-17 ENCOUNTER — Other Ambulatory Visit: Payer: Self-pay

## 2022-12-17 ENCOUNTER — Other Ambulatory Visit: Payer: Self-pay | Admitting: Pharmacist

## 2022-12-17 NOTE — Progress Notes (Signed)
12/17/2022 Name: Larry Maxwell MRN: [id number] DOB: Aug 14, 1965  Chief Complaint  Patient presents with   Medication Management    Larry Maxwell is a 57 y.o. year old male who presented for a telephone visit.   They were referred to the pharmacist by their PCP for assistance in managing medication access.   Patient referred for medication cost concerns- Medicaid patient with no income currently.   Subjective:  Care Team: Primary Care Provider: Sherlyn Hay, DO ; Next Scheduled Visit: 02/02/23 Clinical Pharmacist: Marlowe Aschoff, PharmD  Medication Access/Adherence  Current Pharmacy:  [facility] [address] [place] [address] Phone: [phone] Fax: [phone]  [facility] [address], [place] [facility] [address] [place] [address] Phone: [phone] Fax: [phone]   Patient reports affordability concerns with their medications: Yes - all medications due to waiting on disability check Patient reports access/transportation concerns to their pharmacy: No  Patient reports adherence concerns with their medications:  No     Medication Management:  All medications are currently 1 month past due as he cannot afford them  Insurance coverage: [place] Medicaid  Objective:  No results found for: "HGBA1C"  Lab Results  Component Value Date   CREATININE 1.29 (H) 10/25/2022   BUN 16 10/25/2022   NA 139 10/25/2022   K 3.7 10/25/2022   CL 106 10/25/2022   CO2 22 10/25/2022    Lab Results  Component Value Date   CHOL 214 (H) 07/23/2022   HDL 47 07/23/2022   LDLCALC 125 (H) 07/23/2022   TRIG 240 (H) 07/23/2022   CHOLHDL 4.6 07/23/2022    Medications Reviewed Today     Reviewed by Pollie Friar, RPH (Pharmacist) on 12/17/22 at 1311  Med List Status: <None>   Medication Order Taking? Sig Documenting Provider Last Dose Status Informant  albuterol (VENTOLIN HFA) 108 (90 Base) MCG/ACT  inhaler [id number] Yes Inhale 2 puffs into the lungs every 6 (six) hours as needed for wheezing or shortness of breath. Chesley Noon, MD Taking Active   amLODipine (NORVASC) 10 MG tablet [id number] Yes Take 1 tablet (10 mg total) by mouth daily for 30 doses. Sherlyn Hay, DO Taking Active   benzonatate (TESSALON) 100 MG capsule [id number] Yes Take 1 capsule (100 mg total) by mouth 2 (two) times daily as needed for cough. Sherlyn Hay, DO Taking Active   celecoxib (CELEBREX) 100 MG capsule [id number] Yes Take 1 capsule (100 mg total) by mouth 2 (two) times daily. Sherlyn Hay, DO Taking Active   cetirizine (ZYRTEC) 10 MG tablet [id number] Yes Take 1 tablet (10 mg total) by mouth daily. Sherlyn Hay, DO Taking Active   chlorthalidone (HYGROTON) 25 MG tablet [id number] Yes TAKE 1 TABLET (25 MG TOTAL) BY MOUTH DAILY. Sherlyn Hay, DO Taking Active   gabapentin (NEURONTIN) 300 MG capsule [id number] Yes Take 1 tab morning, 1 tab at midday, and 2 tabs before bedtime Pardue, Sarah N, DO Taking Active   ipratropium-albuterol (DUONEB) 0.5-2.5 (3) MG/3ML SOLN [id number] Yes Take 3 mLs by nebulization every 6 (six) hours as needed. Janann Colonel, MD Taking Active   methocarbamol (ROBAXIN) 500 MG tablet [id number] Yes Take 1 tablet (500 mg total) by mouth every 6 (six) hours as needed for muscle spasms. Sherlyn Hay, DO Taking Active   metoprolol succinate (TOPROL-XL) 25 MG 24 hr tablet [id number] Yes Take 1 tablet (25 mg total) by mouth daily. Pardue,  Monico Blitz, DO Taking Active   montelukast (SINGULAIR) 10 MG tablet [id number] Yes Take 1 tablet (10 mg total) by mouth at bedtime. Sherlyn Hay, DO Taking Active   rosuvastatin (CRESTOR) 10 MG tablet [id number] Yes Take 1 tablet (10 mg total) by mouth daily. Sherlyn Hay, DO Taking Active   sertraline (ZOLOFT) 50 MG tablet [id number] Yes Take 1 tablet (50 mg total) by mouth daily. Sherlyn Hay, DO Taking Active   valsartan (DIOVAN) 40 MG tablet  [id number] Yes Take 1 tablet (40 mg total) by mouth daily. Start taking after one week, if top BP number is >125 Sherlyn Hay, DO Taking Active            Med Note Lorenso Courier, Marjory Lies   Fri Dec 17, 2022  1:10 PM) Taking daily              Assessment/Plan:   Medication Management: - Patient [facility] use of charge account due to difficulty affording medications at this time    Follow Up Plan:  - No follow-up at this time. Will set task to ensure medications are picked up and re-started for the patient - Called [facility] and use of charge account established  - Advised that he has 14 medications and I will assess cost of Breztri; if too expensive, will see about getting PAP started  - Will call the patient and verify he medications all need filled and transferred  - Requested all filled except [facility]  Update from 12/20/22: - Called pharmacy and re-requested that [facility]- advised that person from Friday it was his last day working there  Marlowe Aschoff, PharmD [facility] Phone Number: [phone]

## 2022-12-20 ENCOUNTER — Encounter: Payer: Medicaid Other | Admitting: *Deleted

## 2022-12-20 ENCOUNTER — Other Ambulatory Visit: Payer: Self-pay

## 2022-12-20 ENCOUNTER — Ambulatory Visit: Payer: Medicaid Other | Admitting: Clinical

## 2022-12-20 DIAGNOSIS — F323 Major depressive disorder, single episode, severe with psychotic features: Secondary | ICD-10-CM

## 2022-12-20 DIAGNOSIS — J449 Chronic obstructive pulmonary disease, unspecified: Secondary | ICD-10-CM

## 2022-12-20 DIAGNOSIS — J4489 Other specified chronic obstructive pulmonary disease: Secondary | ICD-10-CM | POA: Diagnosis not present

## 2022-12-20 MED ORDER — ESCITALOPRAM OXALATE 20 MG PO TABS
20.0000 mg | ORAL_TABLET | Freq: Every day | ORAL | 3 refills | Status: DC
  Filled 2022-12-20: qty 30, 30d supply, fill #0

## 2022-12-20 MED ORDER — DULOXETINE HCL 60 MG PO CPEP: 60.0000 mg | ORAL_CAPSULE | Freq: Every day | ORAL | 0 refills | Status: DC

## 2022-12-20 MED ORDER — PREDNISONE 20 MG PO TABS
40.0000 mg | ORAL_TABLET | Freq: Every day | ORAL | 0 refills | Status: DC
  Filled 2022-12-20: qty 8, 4d supply, fill #0

## 2022-12-20 MED ORDER — TRELEGY ELLIPTA 100-62.5-25 MCG/ACT IN AEPB
1.0000 | INHALATION_SPRAY | Freq: Every day | RESPIRATORY_TRACT | 6 refills | Status: DC
  Filled 2022-12-20: qty 60, 30d supply, fill #0

## 2022-12-20 MED ORDER — CHLORTHALIDONE 25 MG PO TABS
25.0000 mg | ORAL_TABLET | Freq: Every day | ORAL | 0 refills | Status: DC
  Filled 2022-12-20: qty 90, 90d supply, fill #0

## 2022-12-20 NOTE — Progress Notes (Unsigned)
                Dezi Schaner, LCSW 
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
## 2022-12-20 NOTE — Progress Notes (Signed)
Daily Session Note  Patient Details  Name: Larry Maxwell MRN: [id number] Date of Birth: 1965/01/28 Referring Provider:   Flowsheet Row Pulmonary Rehab from 11/11/2022 in [facility] Cardiac and Pulmonary Rehab  Referring Provider Janann Colonel       Encounter Date: 12/20/2022  Check In:  Session Check In - 12/20/22 0748       Check-In   Supervising physician immediately available to respond to emergencies See telemetry face sheet for immediately available ER MD    Location [facility]-Cardiac & Pulmonary Rehab    Staff Present Elige Ko, Algernon Huxley, BS, ACSM CEP, Exercise Physiologist;Monique Gift Katrinka Blazing, RN, Marcell Barlow, RDN, LDN    Virtual Visit No    Medication changes reported     No    Fall or balance concerns reported    No    Warm-up and Cool-down Performed on first and last piece of equipment    Resistance Training Performed Yes    VAD Patient? No    PAD/SET Patient? No      Pain Assessment   Currently in Pain? No/denies                Social History   Tobacco Use  Smoking Status Former   Current packs/day: 0.00   Average packs/day: 1.5 packs/day for 20.0 years (30.0 ttl pk-yrs)   Types: Cigarettes   Start date: 12/19/2001   Quit date: 12/19/2021   Years since quitting: 1.0  Smokeless Tobacco Never    Goals Met:  Independence with exercise equipment Exercise tolerated well No report of concerns or symptoms today Strength training completed today  Goals Unmet:  Not Applicable  Comments: Pt able to follow exercise prescription today without complaint.  Will continue to monitor for progression.    Dr. Bethann Punches is Medical Director for [facility] Cardiac Rehabilitation.  Dr. Vida Rigger is Medical Director for [facility] Pulmonary Rehabilitation.

## 2022-12-21 ENCOUNTER — Other Ambulatory Visit (HOSPITAL_COMMUNITY): Payer: Self-pay

## 2022-12-21 ENCOUNTER — Other Ambulatory Visit: Payer: Self-pay

## 2022-12-21 ENCOUNTER — Telehealth: Payer: Self-pay | Admitting: Family Medicine

## 2022-12-21 ENCOUNTER — Encounter: Payer: Medicaid Other | Admitting: *Deleted

## 2022-12-21 ENCOUNTER — Telehealth: Payer: Self-pay | Admitting: Pulmonary Disease

## 2022-12-21 DIAGNOSIS — J4489 Other specified chronic obstructive pulmonary disease: Secondary | ICD-10-CM | POA: Diagnosis not present

## 2022-12-21 DIAGNOSIS — G8929 Other chronic pain: Secondary | ICD-10-CM

## 2022-12-21 DIAGNOSIS — J449 Chronic obstructive pulmonary disease, unspecified: Secondary | ICD-10-CM

## 2022-12-21 MED ORDER — ALBUTEROL SULFATE HFA 108 (90 BASE) MCG/ACT IN AERS
2.0000 | INHALATION_SPRAY | Freq: Four times a day (QID) | RESPIRATORY_TRACT | 1 refills | Status: DC | PRN
  Filled 2022-12-21: qty 18, 30d supply, fill #0
  Filled 2023-04-01: qty 18, 30d supply, fill #1

## 2022-12-21 NOTE — Telephone Encounter (Signed)
[facility] is requesting prescription refill methocarbamol (ROBAXIN) 500 MG tablet  Please advise

## 2022-12-21 NOTE — Telephone Encounter (Signed)
[facility] is requesting refills on Albuterol 108 (90 base) mcg/act inhaler

## 2022-12-21 NOTE — Progress Notes (Signed)
Daily Session Note  Patient Details  Name: OWYN GARDE MRN: [id number] Date of Birth: 08-Jun-1965 Referring Provider:   Flowsheet Row Pulmonary Rehab from 11/11/2022 in [facility] Cardiac and Pulmonary Rehab  Referring Provider Janann Colonel       Encounter Date: 12/21/2022  Check In:  Session Check In - 12/21/22 0756       Check-In   Supervising physician immediately available to respond to emergencies See telemetry face sheet for immediately available ER MD    Location [facility]-Cardiac & Pulmonary Rehab    Staff Present Rory Percy, MS, Exercise Physiologist;Maxon Conetta BS, , Exercise Physiologist;Noah Tickle, BS, Exercise Physiologist;Jessilyn Catino, RN, BSN, CCRP    Virtual Visit No    Medication changes reported     No    Fall or balance concerns reported    No    Warm-up and Cool-down Performed on first and last piece of equipment    Resistance Training Performed Yes    VAD Patient? No    PAD/SET Patient? No      Pain Assessment   Currently in Pain? No/denies                Social History   Tobacco Use  Smoking Status Former   Current packs/day: 0.00   Average packs/day: 1.5 packs/day for 20.0 years (30.0 ttl pk-yrs)   Types: Cigarettes   Start date: 12/19/2001   Quit date: 12/19/2021   Years since quitting: 1.0  Smokeless Tobacco Never    Goals Met:  Proper associated with RPD/PD & O2 Sat Independence with exercise equipment Exercise tolerated well No report of concerns or symptoms today  Goals Unmet:  Not Applicable  Comments: Pt able to follow exercise prescription today without complaint.  Will continue to monitor for progression.    Dr. Bethann Punches is Medical Director for [facility] Cardiac Rehabilitation.  Dr. Vida Rigger is Medical Director for [facility] Pulmonary Rehabilitation.

## 2022-12-21 NOTE — Telephone Encounter (Signed)
[facility] pharmacy states Trelegy not covered until "preferred products are try and fail". Preferred products are Advair Diskus, Advair HFA, Dulera, Symbicort. Please advise

## 2022-12-22 ENCOUNTER — Other Ambulatory Visit: Payer: Self-pay

## 2022-12-26 ENCOUNTER — Other Ambulatory Visit: Payer: Self-pay

## 2022-12-27 ENCOUNTER — Encounter: Payer: Medicaid Other | Attending: Pulmonary Disease | Admitting: *Deleted

## 2022-12-27 DIAGNOSIS — J449 Chronic obstructive pulmonary disease, unspecified: Secondary | ICD-10-CM | POA: Diagnosis present

## 2022-12-27 DIAGNOSIS — J441 Chronic obstructive pulmonary disease with (acute) exacerbation: Secondary | ICD-10-CM | POA: Insufficient documentation

## 2022-12-27 NOTE — Progress Notes (Signed)
Daily Session Note  Patient Details  Name: Larry Maxwell MRN: [id number] Date of Birth: 07-Sep-1965 Referring Provider:   Flowsheet Row Pulmonary Rehab from 11/11/2022 in [facility] Cardiac and Pulmonary Rehab  Referring Provider Janann Colonel       Encounter Date: 12/27/2022  Check In:  Session Check In - 12/27/22 0903       Check-In   Supervising physician immediately available to respond to emergencies See telemetry face sheet for immediately available ER MD    Location [facility]-Cardiac & Pulmonary Rehab    Staff Present Tommye Standard, BS, ACSM CEP, Exercise Physiologist;Larnie Heart Katrinka Blazing, RN, ADN;Laureen Manson Passey, BS, RRT, CPFT;Jason Wallace Cullens, RDN, LDN    Virtual Visit No    Medication changes reported     No    Fall or balance concerns reported    No    Warm-up and Cool-down Performed on first and last piece of equipment    Resistance Training Performed Yes    VAD Patient? No    PAD/SET Patient? No      Pain Assessment   Currently in Pain? No/denies                Social History   Tobacco Use  Smoking Status Former   Current packs/day: 0.00   Average packs/day: 1.5 packs/day for 20.0 years (30.0 ttl pk-yrs)   Types: Cigarettes   Start date: 12/19/2001   Quit date: 12/19/2021   Years since quitting: 1.0  Smokeless Tobacco Never    Goals Met:  Independence with exercise equipment Exercise tolerated well No report of concerns or symptoms today Strength training completed today  Goals Unmet:  Not Applicable  Comments: Pt able to follow exercise prescription today without complaint.  Will continue to monitor for progression.    Dr. Bethann Punches is Medical Director for [facility] Cardiac Rehabilitation.  Dr. Vida Rigger is Medical Director for [facility] Pulmonary Rehabilitation.

## 2022-12-28 ENCOUNTER — Encounter: Payer: Medicaid Other | Admitting: *Deleted

## 2022-12-28 ENCOUNTER — Other Ambulatory Visit: Payer: Self-pay

## 2022-12-28 ENCOUNTER — Telehealth: Payer: Self-pay | Admitting: Family Medicine

## 2022-12-28 DIAGNOSIS — J449 Chronic obstructive pulmonary disease, unspecified: Secondary | ICD-10-CM

## 2022-12-28 DIAGNOSIS — J441 Chronic obstructive pulmonary disease with (acute) exacerbation: Secondary | ICD-10-CM | POA: Diagnosis not present

## 2022-12-28 NOTE — Progress Notes (Signed)
Daily Session Note  Patient Details  Name: Larry Maxwell MRN: [id number] Date of Birth: 03-Jul-1965 Referring Provider:   Flowsheet Row Pulmonary Rehab from 11/11/2022 in [facility] Cardiac and Pulmonary Rehab  Referring Provider Janann Colonel       Encounter Date: 12/28/2022  Check In:  Session Check In - 12/28/22 0806       Check-In   Location [facility]-Cardiac & Pulmonary Rehab    Staff Present Cora Collum, RN, BSN, CCRP;Maxon Conetta BS, , Exercise Physiologist;Margaret Best, MS, Exercise Physiologist;Noah Tickle, BS, Exercise Physiologist    Virtual Visit No    Medication changes reported     No    Fall or balance concerns reported    No    Warm-up and Cool-down Performed on first and last piece of equipment    Resistance Training Performed Yes    VAD Patient? No    PAD/SET Patient? No      Pain Assessment   Currently in Pain? No/denies                Social History   Tobacco Use  Smoking Status Former   Current packs/day: 0.00   Average packs/day: 1.5 packs/day for 20.0 years (30.0 ttl pk-yrs)   Types: Cigarettes   Start date: 12/19/2001   Quit date: 12/19/2021   Years since quitting: 1.0  Smokeless Tobacco Never    Goals Met:  Proper associated with RPD/PD & O2 Sat Independence with exercise equipment Exercise tolerated well No report of concerns or symptoms today  Goals Unmet:  Not Applicable  Comments: Pt able to follow exercise prescription today without complaint.  Will continue to monitor for progression.    Dr. Bethann Punches is Medical Director for [facility] Cardiac Rehabilitation.  Dr. Vida Rigger is Medical Director for [facility] Pulmonary Rehabilitation.

## 2022-12-28 NOTE — Telephone Encounter (Signed)
[facility] pharmacy faxed refill request for the following medications:    methocarbamol (ROBAXIN) 500 MG tablet    Please advise

## 2022-12-29 ENCOUNTER — Other Ambulatory Visit: Payer: Self-pay

## 2022-12-29 ENCOUNTER — Ambulatory Visit: Payer: Medicaid Other | Admitting: Clinical

## 2022-12-29 DIAGNOSIS — F323 Major depressive disorder, single episode, severe with psychotic features: Secondary | ICD-10-CM | POA: Diagnosis not present

## 2022-12-29 MED ORDER — METHOCARBAMOL 500 MG PO TABS
500.0000 mg | ORAL_TABLET | Freq: Four times a day (QID) | ORAL | 0 refills | Status: DC | PRN
  Filled 2022-12-29: qty 20, 5d supply, fill #0

## 2022-12-29 NOTE — Progress Notes (Signed)
[facility] Counselor/Therapist Progress Note  Patient ID: Larry Maxwell, MRN: [id number]    Date: 12/29/22  Time Spent: 8:36  am - 9:25 am : 49 Minutes  Treatment Type: Individual Therapy.  Reported Symptoms: crying, depressed mood, loss of interest, isolation  Mental Status Exam: Appearance:  Neat and Well Groomed     Behavior: Appropriate  Motor: Normal  Speech/Language:  Clear and Coherent  Affect: Tearful  Mood: depressed  Thought process: normal  Thought content:   WNL  Sensory/Perceptual disturbances:   WNL  Orientation: oriented to person, place, and situation  Attention: Good  Concentration: Good  Memory: WNL  Fund of knowledge:  Good  Insight:   Good  Judgment:  Good  Impulse Control: Good   Risk Assessment: Danger to Self:  No Patient denied current suicidal ideation  Self-injurious Behavior: No Danger to Others: No Patient denied current homicidal ideation Duty to Warn:no Physical Aggression / Violence:No  Access to Firearms a concern: No  Gang Involvement:No   Subjective:  Patient stated, "I think everything's been going alright, I have my good days and I have my bad days". Patient reported feeling "down and depressed" and did not want to leave the home recently. Patient stated,  "I'm going through so much" (COPD, stage 4 lung cancer). Patient reported his chest hurts due to medical conditions. Patient stated, "I stay depressed all the time and all I want to do is cry". Patient stated, "I don't want to socialize with nobody". Patient reported he lives with his sister and wants to find a place of his own. Patient stated, "I try to keep everything bottled up on the inside". Patient reported a recent argument with his daughter and reported patient/daughter have not spoken in a week. Patient reported he moved to [place] in 2023 and his relationship with significant other ended in 2023. Patient reported significant other would not allow patient  to obtain his belongings. Patient stated, "its been a struggle" since ending relationship with significant other. Patient reported history of suicidal ideation with a plan to run his car down an embankment. Patient reported he was diagnosed with lung cancer in 2006 and COPD within the last year. Patient stated, "Im willing to do whatever" in response to therapy. Patient reported he has an appointment with a psychiatrist, Dr. Vanetta Shawl, in December 2024.    Interventions: Motivational Interviewing. Clinician conducted session in person at clinician's office at [facility]. Reviewed events since last session. Assessed patient's mood. Discussed current stressors. Clinician reviewed diagnosis and treatment recommendations. Provided psycho education related to diagnosis and treatment.   Collaboration of Care: Psychiatrist AEB Discussed for consent for psychiatrist, Dr. Vanetta Shawl   Diagnosis:  Major depressive disorder, single episode, severe with psychotic features (HCC)   Plan: To be determined during follow up appointment on 01/05/23.          Doree Barthel, LCSW

## 2022-12-30 ENCOUNTER — Encounter: Payer: Medicaid Other | Admitting: *Deleted

## 2022-12-30 ENCOUNTER — Other Ambulatory Visit: Payer: Self-pay

## 2022-12-30 DIAGNOSIS — J441 Chronic obstructive pulmonary disease with (acute) exacerbation: Secondary | ICD-10-CM | POA: Diagnosis not present

## 2022-12-30 DIAGNOSIS — J449 Chronic obstructive pulmonary disease, unspecified: Secondary | ICD-10-CM

## 2022-12-30 NOTE — Progress Notes (Signed)
Daily Session Note  Patient Details  Name: Larry Maxwell MRN: [id number] Date of Birth: [birth year] Referring Provider:   Flowsheet Row Pulmonary Rehab from 11/11/2022 in [facility] Cardiac and Pulmonary Rehab  Referring Provider Janann Colonel       Encounter Date: 12/30/2022  Check In:  Session Check In - 12/30/22 0746       Check-In   Supervising physician immediately available to respond to emergencies See telemetry face sheet for immediately available ER MD    Location [facility]-Cardiac & Pulmonary Rehab    Staff Present Cora Collum, RN, BSN, CCRP;Noah Tickle, BS, Exercise Physiologist;Joseph [place], RCP,RRT,BSRT;Maxon [place] BS, , Exercise Physiologist    Virtual Visit No    Medication changes reported     No    Fall or balance concerns reported    No    Warm-up and Cool-down Performed on first and last piece of equipment    Resistance Training Performed Yes    VAD Patient? No    PAD/SET Patient? No      Pain Assessment   Currently in Pain? No/denies                Social History   Tobacco Use  Smoking Status Former   Current packs/day: 0.00   Average packs/day: 1.5 packs/day for 20.0 years (30.0 ttl pk-yrs)   Types: Cigarettes   Start date: 12/19/2001   Quit date: 12/19/2021   Years since quitting: 1.0  Smokeless Tobacco Never    Goals Met:  Independence with exercise equipment Exercise tolerated well No report of concerns or symptoms today  Goals Unmet:  Not Applicable  Comments: Pt able to follow exercise prescription today without complaint.  Will continue to monitor for progression.    Dr. Bethann Punches is Medical Director for [facility] Cardiac Rehabilitation.  Dr. Vida Rigger is Medical Director for [facility] Pulmonary Rehabilitation.

## 2023-01-03 ENCOUNTER — Ambulatory Visit: Payer: Medicaid Other | Admitting: Obstetrics and Gynecology

## 2023-01-03 ENCOUNTER — Encounter: Payer: Medicaid Other | Admitting: *Deleted

## 2023-01-03 ENCOUNTER — Other Ambulatory Visit: Payer: Self-pay

## 2023-01-03 DIAGNOSIS — J441 Chronic obstructive pulmonary disease with (acute) exacerbation: Secondary | ICD-10-CM | POA: Diagnosis not present

## 2023-01-03 DIAGNOSIS — J449 Chronic obstructive pulmonary disease, unspecified: Secondary | ICD-10-CM

## 2023-01-03 NOTE — Progress Notes (Signed)
Daily Session Note  Patient Details  Name: Larry Maxwell MRN: [id number] Date of Birth: [birth year] Referring Provider:   Flowsheet Row Pulmonary Rehab from 11/11/2022 in [facility] Cardiac and Pulmonary Rehab  Referring Provider Janann Colonel       Encounter Date: 01/03/2023  Check In:  Session Check In - 01/03/23 0753       Check-In   Supervising physician immediately available to respond to emergencies See telemetry face sheet for immediately available ER MD    Location [facility]-Cardiac & Pulmonary Rehab    Staff Present Cora Collum, RN, BSN, CCRP;Jason Wallace Cullens, RDN, LDN;Joseph [place], Guinevere Ferrari, RN, [place]    Virtual Visit No    Medication changes reported     No    Fall or balance concerns reported    No    Warm-up and Cool-down Performed on first and last piece of equipment    Resistance Training Performed Yes    VAD Patient? No    PAD/SET Patient? No      Pain Assessment   Currently in Pain? No/denies                Social History   Tobacco Use  Smoking Status Former   Current packs/day: 0.00   Average packs/day: 1.5 packs/day for 20.0 years (30.0 ttl pk-yrs)   Types: Cigarettes   Start date: 12/19/2001   Quit date: 12/19/2021   Years since quitting: 1.0  Smokeless Tobacco Never    Goals Met:  Independence with exercise equipment Exercise tolerated well No report of concerns or symptoms today Strength training completed today  Goals Unmet:  Not Applicable  Comments: Pt able to follow exercise prescription today without complaint.  Will continue to monitor for progression.    Dr. Bethann Punches is Medical Director for [facility] Cardiac Rehabilitation.  Dr. Vida Rigger is Medical Director for [facility] Pulmonary Rehabilitation.

## 2023-01-04 ENCOUNTER — Encounter: Payer: Medicaid Other | Admitting: *Deleted

## 2023-01-04 DIAGNOSIS — J449 Chronic obstructive pulmonary disease, unspecified: Secondary | ICD-10-CM

## 2023-01-04 DIAGNOSIS — J441 Chronic obstructive pulmonary disease with (acute) exacerbation: Secondary | ICD-10-CM | POA: Diagnosis not present

## 2023-01-04 NOTE — Progress Notes (Signed)
Daily Session Note  Patient Details  Name: Larry Maxwell MRN: [id number] Date of Birth: Dec 08, 1965 Referring Provider:   Flowsheet Row Pulmonary Rehab from 11/11/2022 in [facility] Cardiac and Pulmonary Rehab  Referring Provider Janann Colonel       Encounter Date: 01/04/2023  Check In:  Session Check In - 01/04/23 0753       Check-In   Staff Present Maxon Manya Silvas BS, , Exercise Physiologist;Noah Tickle, BS, Exercise Physiologist;Amanie Mcculley, RN, BSN, CCRP    Virtual Visit No    Medication changes reported     No    Fall or balance concerns reported    No    Warm-up and Cool-down Performed on first and last piece of equipment    Resistance Training Performed Yes    VAD Patient? No    PAD/SET Patient? No      Pain Assessment   Currently in Pain? No/denies                Social History   Tobacco Use  Smoking Status Former   Current packs/day: 0.00   Average packs/day: 1.5 packs/day for 20.0 years (30.0 ttl pk-yrs)   Types: Cigarettes   Start date: 12/19/2001   Quit date: 12/19/2021   Years since quitting: 1.0  Smokeless Tobacco Never    Goals Met:  Proper associated with RPD/PD & O2 Sat Independence with exercise equipment Exercise tolerated well No report of concerns or symptoms today  Goals Unmet:  Not Applicable  Comments: Pt able to follow exercise prescription today without complaint.  Will continue to monitor for progression.    Dr. Bethann Punches is Medical Director for [facility] Cardiac Rehabilitation.  Dr. Vida Rigger is Medical Director for [facility] Pulmonary Rehabilitation.

## 2023-01-05 ENCOUNTER — Ambulatory Visit: Payer: Medicaid Other | Admitting: Clinical

## 2023-01-05 DIAGNOSIS — F323 Major depressive disorder, single episode, severe with psychotic features: Secondary | ICD-10-CM | POA: Diagnosis not present

## 2023-01-05 NOTE — Progress Notes (Signed)
[facility] Counselor/Therapist Progress Note  Patient ID: Larry Maxwell, MRN: [id number]    Date: 01/05/23  Time Spent: 8:35  am - 9:38 am : 63 Minutes  Treatment Type: Individual Therapy.  Reported Symptoms: crying at times, depressed mood at times, loss of interest  Mental Status Exam: Appearance:  Neat and Well Groomed     Behavior: Appropriate  Motor: Normal  Speech/Language:  Clear and Coherent  Affect: Appropriate  Mood: normal  Thought process: normal  Thought content:   WNL  Sensory/Perceptual disturbances:   WNL  Orientation: oriented to person, place, and situation  Attention: Good  Concentration: Good  Memory: WNL  Fund of knowledge:  Good  Insight:   Good  Judgment:  Good  Impulse Control: Good   Risk Assessment: Danger to Self:  No Patient denied current suicidal ideation  Self-injurious Behavior: No Danger to Others: No Patient denied current homicidal ideation Duty to Warn:no Physical Aggression / Violence:No  Access to Firearms a concern: No  Gang Involvement:No   Subjective:  Patient stated, "pretty good" in response to events since last session. Patient reported experiencing a "break down" on Saturday and stated, "I just woke up depressed". Patient reported he started taking Zoloft last Tuesday and stated, "I haven't woke up depressed or having mood changes". Patient reported difficulty staying asleep prior to taking Zoloft. Patient stated, "I feel good today, I'm not down, I'm not depressed, I feel happy". Patient stated, "I have to make the best of it". Patient stated, "that's when the depression really set it" in reference to diagnosis of stage 4 lung cancer. Patient stated, "to focus on myself", "work the program", "work with counselors for treatment" in response to patient's goals. Patient reported he goes for walks when he is experiencing a decline in mood. Patient stated, "when I think about the things that have happened in my past I  get very emotional", "I want to get rid of those feelings", "get rid of all that hurt and pain". Patient reported he experiences thoughts of "the why's and if's". Patient stated,  "I think if I got out more it would help me a lot".   Interventions: Motivational Interviewing. Clinician conducted session in person at clinician's office at [facility]. Reviewed events since last session. Assessed patient's mood since last session and assessed current mood. Provided psycho education related to cognitive distortions. Clinician utilized motivational interviewing to explore potential goals for therapy. Clinician utilized a task centered approach in collaboration with patient to develop goals for therapy. Patient participated in development of goals and agreed to goals for therapy.   Diagnosis:  Major depressive disorder, single episode, severe with psychotic features (HCC)   Plan: Patient is to utilize [employer] Therapy, thought re-framing, mindfulness, healthy communication, and coping strategies to decrease symptoms associated with their diagnosis. Frequency: weekly  Modality: individual     Long-term goal:   Reduce overall level, frequency, and intensity of the feelings of depression as evidenced by decreased isolation, depressed mood, lack of appetite, difficulty falling asleep and staying asleep, lack of energy, loss of interest, and difficulty focusing from 7 days/week to 0 to 1 days/week per patient report for at least 3 consecutive months. Target Date: 01/05/24  Progress: established 01/05/23   Short-term goal:  Identify triggers for depressive symptoms Target Date: 07/06/23  Progress: established 01/05/23   Develop and implement coping strategies to utilize in response to depressive symptoms Target Date: 07/06/23  Progress: established 01/05/23   Increase  participation in enjoyable activities from 3 days per week to 5 day per week Target Date: 07/06/23  Progress:  established 01/05/23   patient will verbalize positive statements regarding self and his ability to cope with life stressors Target Date: 07/06/23  Progress: established 01/05/23   Verbalize patient's thoughts and feelings to through effective communication strategies per patient's report Target Date: 07/06/23  Progress: established 01/05/23   Identify, challenge, and replace negative thought patterns and negative self talk that contribute to feelings of depression with positive thoughts, beliefs, and positive self talk per patient's report Target Date: 07/06/23  Progress: established 01/05/23                      Doree Barthel, LCSW

## 2023-01-06 ENCOUNTER — Encounter: Payer: Medicaid Other | Admitting: *Deleted

## 2023-01-06 ENCOUNTER — Ambulatory Visit: Payer: Medicaid Other | Admitting: Licensed Clinical Social Worker

## 2023-01-06 DIAGNOSIS — J449 Chronic obstructive pulmonary disease, unspecified: Secondary | ICD-10-CM

## 2023-01-06 DIAGNOSIS — J441 Chronic obstructive pulmonary disease with (acute) exacerbation: Secondary | ICD-10-CM | POA: Diagnosis not present

## 2023-01-06 NOTE — Progress Notes (Signed)
Daily Session Note  Patient Details  Name: Larry Maxwell MRN: [id number] Date of Birth: 1965-09-27 Referring Provider:   Flowsheet Row Pulmonary Rehab from 11/11/2022 in [facility] Cardiac and Pulmonary Rehab  Referring Provider Janann Colonel       Encounter Date: 01/06/2023  Check In:  Session Check In - 01/06/23 0744       Check-In   Supervising physician immediately available to respond to emergencies See telemetry face sheet for immediately available ER MD    Location [facility]-Cardiac & Pulmonary Rehab    Staff Present Ronette Deter, BS, Exercise Physiologist;Joseph [place], RCP,RRT,BSRT;Maxon [place] BS, , Exercise Physiologist;Amayrani Bennick Katrinka Blazing, RN, [place]    Virtual Visit No    Medication changes reported     No    Fall or balance concerns reported    No    Warm-up and Cool-down Performed on first and last piece of equipment    Resistance Training Performed Yes    VAD Patient? No    PAD/SET Patient? No      Pain Assessment   Currently in Pain? No/denies                Social History   Tobacco Use  Smoking Status Former   Current packs/day: 0.00   Average packs/day: 1.5 packs/day for 20.0 years (30.0 ttl pk-yrs)   Types: Cigarettes   Start date: 12/19/2001   Quit date: 12/19/2021   Years since quitting: 1.0  Smokeless Tobacco Never    Goals Met:  Independence with exercise equipment Exercise tolerated well No report of concerns or symptoms today Strength training completed today  Goals Unmet:  Not Applicable  Comments: Pt able to follow exercise prescription today without complaint.  Will continue to monitor for progression.    Dr. Bethann Punches is Medical Director for [facility] Cardiac Rehabilitation.  Dr. Vida Rigger is Medical Director for [facility] Pulmonary Rehabilitation.

## 2023-01-10 ENCOUNTER — Encounter: Payer: Medicaid Other | Admitting: *Deleted

## 2023-01-10 DIAGNOSIS — J441 Chronic obstructive pulmonary disease with (acute) exacerbation: Secondary | ICD-10-CM | POA: Diagnosis not present

## 2023-01-10 DIAGNOSIS — J449 Chronic obstructive pulmonary disease, unspecified: Secondary | ICD-10-CM

## 2023-01-10 NOTE — Progress Notes (Signed)
Daily Session Note  Patient Details  Name: JOEANGEL PROROK MRN: [id number] Date of Birth: 1965-04-17 Referring Provider:   Flowsheet Row Pulmonary Rehab from 11/11/2022 in [facility] Cardiac and Pulmonary Rehab  Referring Provider Janann Colonel       Encounter Date: 01/10/2023  Check In:  Session Check In - 01/10/23 0747       Check-In   Supervising physician immediately available to respond to emergencies See telemetry face sheet for immediately available ER MD    Location [facility]-Cardiac & Pulmonary Rehab    Staff Present Balinda Quails, RDN, LDN;Joseph Reino Kent, Algernon Huxley, BS, ACSM CEP, Exercise Physiologist;Iley Breeden Katrinka Blazing, RN, [place]    Virtual Visit No    Medication changes reported     No    Fall or balance concerns reported    No    Warm-up and Cool-down Performed on first and last piece of equipment    Resistance Training Performed Yes    VAD Patient? No    PAD/SET Patient? No      Pain Assessment   Currently in Pain? No/denies                Social History   Tobacco Use  Smoking Status Former   Current packs/day: 0.00   Average packs/day: 1.5 packs/day for 20.0 years (30.0 ttl pk-yrs)   Types: Cigarettes   Start date: 12/19/2001   Quit date: 12/19/2021   Years since quitting: 1.0  Smokeless Tobacco Never    Goals Met:  Independence with exercise equipment Exercise tolerated well No report of concerns or symptoms today Strength training completed today  Goals Unmet:  Not Applicable  Comments: Pt able to follow exercise prescription today without complaint.  Will continue to monitor for progression.    Dr. Bethann Punches is Medical Director for [facility] Cardiac Rehabilitation.  Dr. Vida Rigger is Medical Director for [facility] Pulmonary Rehabilitation.

## 2023-01-11 ENCOUNTER — Other Ambulatory Visit: Payer: Self-pay | Admitting: Obstetrics and Gynecology

## 2023-01-11 ENCOUNTER — Encounter: Payer: Medicaid Other | Admitting: *Deleted

## 2023-01-11 DIAGNOSIS — J441 Chronic obstructive pulmonary disease with (acute) exacerbation: Secondary | ICD-10-CM | POA: Diagnosis not present

## 2023-01-11 DIAGNOSIS — J449 Chronic obstructive pulmonary disease, unspecified: Secondary | ICD-10-CM

## 2023-01-11 NOTE — Progress Notes (Signed)
Daily Session Note  Patient Details  Name: Larry Maxwell MRN: [id number] Date of Birth: 10-Apr-1965 Referring Provider:   Flowsheet Row Pulmonary Rehab from 11/11/2022 in [facility] Cardiac and Pulmonary Rehab  Referring Provider Janann Colonel       Encounter Date: 01/11/2023  Check In:  Session Check In - 01/11/23 0814       Check-In   Supervising physician immediately available to respond to emergencies See telemetry face sheet for immediately available ER MD    Location [facility]-Cardiac & Pulmonary Rehab    Staff Present Cora Collum, RN, BSN, CCRP;Noah Tickle, BS, Exercise Physiologist;Maxon Conetta BS, , Exercise Physiologist;Margaret Best, MS, Exercise Physiologist    Virtual Visit No    Medication changes reported     No    Fall or balance concerns reported    No    Warm-up and Cool-down Performed on first and last piece of equipment    Resistance Training Performed Yes    VAD Patient? No    PAD/SET Patient? No      Pain Assessment   Currently in Pain? No/denies                Social History   Tobacco Use  Smoking Status Former   Current packs/day: 0.00   Average packs/day: 1.5 packs/day for 20.0 years (30.0 ttl pk-yrs)   Types: Cigarettes   Start date: 12/19/2001   Quit date: 12/19/2021   Years since quitting: 1.0  Smokeless Tobacco Never    Goals Met:  Proper associated with RPD/PD & O2 Sat Independence with exercise equipment Exercise tolerated well No report of concerns or symptoms today  Goals Unmet:  Not Applicable  Comments: Pt able to follow exercise prescription today without complaint.  Will continue to monitor for progression.    Dr. Bethann Punches is Medical Director for [facility] Cardiac Rehabilitation.  Dr. Vida Rigger is Medical Director for [facility] Pulmonary Rehabilitation.

## 2023-01-11 NOTE — Patient Outreach (Signed)
Care Coordination  01/11/2023  HERRON LIPPI May 23, 1965 [id number]  RNCM called patient at scheduled time.  Patient answered phone and asked to be called back tomorrow after 12.  RNCM rescheduled appointment at patient request.  Kathi Der RN, BSN [place]  [facility] Care Management Coordinator - Managed [place] High Risk [phone].

## 2023-01-12 ENCOUNTER — Ambulatory Visit: Payer: Medicaid Other | Admitting: Clinical

## 2023-01-12 ENCOUNTER — Other Ambulatory Visit: Payer: Self-pay | Admitting: Obstetrics and Gynecology

## 2023-01-12 ENCOUNTER — Encounter: Payer: Self-pay | Admitting: *Deleted

## 2023-01-12 DIAGNOSIS — J449 Chronic obstructive pulmonary disease, unspecified: Secondary | ICD-10-CM

## 2023-01-12 NOTE — Patient Outreach (Signed)
Medicaid Managed Care   Nurse Care Manager Note  01/12/2023 Name:  Larry Maxwell MRN:  [id number] DOB:  [birth year]  Larry Maxwell is an 57 y.o. year old male who is a primary Maxwell of Larry Hay, DO.  The [facility] Managed Care Coordination team was consulted for assistance with:    Chronic healthcare management needs,  HTN, asthma, COPD, LBP with left sided sciatica, HLD, depression  Larry Maxwell was given information about Medicaid Managed Care Coordination team services today. Larry Maxwell agreed to services and verbal consent obtained.  Engaged with Maxwell by telephone for initial visit in response to provider referral for case management and/or care coordination services.   Maxwell is participating in a Managed Medicaid Plan:  Yes  Assessments/Interventions:  Review of past medical history, allergies, medications, health status, including review of consultants reports, laboratory and other test data, was performed as part of comprehensive evaluation and provision of chronic care management services.  SDOH (Social Drivers of Health) assessments and interventions performed: SDOH Interventions    Flowsheet Row Maxwell Outreach Telephone from 01/12/2023 in [place] POPULATION HEALTH DEPARTMENT Maxwell Outreach Telephone from 01/11/2023 in [place] POPULATION HEALTH DEPARTMENT Pulmonary Rehab from 01/04/2023 in [facility] Cardiac and Pulmonary Rehab Maxwell Outreach Telephone from 12/09/2022 in [place] POPULATION HEALTH DEPARTMENT Pulmonary Rehab from 12/02/2022 in [facility] Cardiac and Pulmonary Rehab Maxwell Outreach Telephone from 11/26/2022 in [place] POPULATION HEALTH DEPARTMENT  SDOH Interventions        Transportation Interventions -- -- -- Payor Benefit -- --  Utilities Interventions -- Intervention Not Indicated -- -- -- --  Alcohol Usage Interventions Intervention Not Indicated (Score <7) -- -- -- -- --  Depression Interventions/Treatment  -- -- Counseling --  Counseling Referral to Psychiatry, Medication, Counseling  Stress Interventions -- -- -- -- -- Offered [employer], Provide Counseling  Social Connections Interventions Intervention Not Indicated -- -- -- -- --  Health Literacy Interventions -- Intervention Not Indicated -- -- -- --     Care Plan Allergies  Allergen Reactions   Lisinopril Swelling    Angioedema and significant swelling in ankles as well    Medications Reviewed Today     Reviewed by Larry Chandler, RN (Registered Nurse) on 01/12/23 at 1404  Med List Status: <None>   Medication Order Taking? Sig Documenting Provider Last Dose Status Informant  albuterol (VENTOLIN HFA) 108 (90 Base) MCG/ACT inhaler [id number]  Inhale 2 puffs into the lungs every 6 (six) hours as needed for wheezing or shortness of breath. Larry Hay, DO  Active   amLODipine (NORVASC) 10 MG tablet [id number] No Take 1 tablet (10 mg total) by mouth daily for 30 doses. Larry Hay, DO Taking Active   benzonatate (TESSALON) 100 MG capsule [id number] No Take 1 capsule (100 mg total) by mouth 2 (two) times daily as needed for cough. Larry Hay, DO Taking Active   Budeson-Glycopyrrol-Formoterol (BREZTRI AEROSPHERE) 160-9-4.8 MCG/ACT AERO [id number] No Inhale 2 puffs into the lungs in the morning and at bedtime. [provider] Taking Active   celecoxib (CELEBREX) 100 MG capsule [id number] No Take 1 capsule (100 mg total) by mouth 2 (two) times daily. Larry Hay, DO Taking Active   cetirizine (ZYRTEC) 10 MG tablet [id number] No Take 1 tablet (10 mg total) by mouth daily. Larry Hay, DO Taking Active   chlorthalidone (HYGROTON) 25 MG tablet [id number] No TAKE 1 TABLET (25 MG TOTAL) BY MOUTH DAILY. Jacquenette Shone  N, DO Taking Expired 12/17/22 2359   chlorthalidone (HYGROTON) 25 MG tablet [id number]  Take 1 tablet (25 mg total) by mouth daily. Larry Hay, DO  Active      Discontinued 12/21/22 [phone] (Change in therapy)    gabapentin (NEURONTIN) 300 MG capsule [id number] No Take 1 capsule (300 mg total) by mouth in the morning,AND 1 capsule (300 mg total) daily at midday, AND 2 capsules (600 mg total) at bedtime. Larry Hay, DO Taking Active   ipratropium-albuterol (DUONEB) 0.5-2.5 (3) MG/3ML SOLN [id number] No Take 3 mLs by nebulization every 6 (six) hours as needed. Janann Colonel, MD Taking Active   methocarbamol (ROBAXIN) 500 MG tablet [id number]  Take 1 tablet (500 mg total) by mouth every 6 (six) hours as needed for muscle spasms. Larry Hay, DO  Active   metoprolol succinate (TOPROL-XL) 25 MG 24 hr tablet [id number] No Take 1 tablet (25 mg total) by mouth daily. Larry Hay, DO Taking Active   montelukast (SINGULAIR) 10 MG tablet [id number] No Take 1 tablet (10 mg total) by mouth at bedtime. Larry Hay, DO Taking Active   rosuvastatin (CRESTOR) 10 MG tablet [id number] No Take 1 tablet (10 mg total) by mouth daily. Larry Hay, DO Taking Active   sertraline (ZOLOFT) 50 MG tablet [id number] No Take 1 tablet (50 mg total) by mouth daily. Larry Hay, DO Taking Active   valsartan (DIOVAN) 40 MG tablet [id number] No Take 1 tablet (40 mg total) by mouth daily. Start taking after one week, if top BP number is >125 Larry Hay, DO Taking Active            Med Note Lorenso Courier, Marjory Lies   Fri Dec 17, 2022  1:10 PM) Taking daily           Maxwell Active Problem List   Diagnosis Date Noted   Insomnia due to medical condition 12/15/2022   Mixed restrictive and obstructive lung disease (HCC) 10/25/2022   Asthma-COPD overlap syndrome (HCC) 10/25/2022   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) 10/25/2022   Restrictive lung disease 08/25/2022   Paresthesia of right upper extremity 08/17/2022   History of diverticulosis 08/09/2022   Nicotine dependence with current use 08/04/2022   Prediabetes 08/02/2022   Hypertension 07/21/2022   Asthma, allergic, mild persistent,  uncomplicated 07/21/2022   Depression, major, single episode, moderate (HCC) 07/21/2022   Mixed hyperlipidemia 07/21/2022   Chronic midline low back pain with left-sided sciatica 07/21/2022   Conditions to be addressed/monitored per PCP order:  HTN, asthma, COPD, LBP with left sided sciatica, HLD, depression  Care Plan : RN Care Manager Plan of Care  Updates made by Larry Chandler, RN since 01/12/2023 12:00 AM     Problem: Health Promotion or Disease Self-Management (General Plan of Care)      Long-Range Goal: Chronic Disease Management and Care Coordination Needs   Start Date: 01/12/2023  Expected End Date: 04/12/2023  Priority: High  Note:   Current Barriers:  Knowledge Deficits related to plan of care for management of HTN, asthma, COPD, LBP with left sided sciatica, HLD, depression Care Coordination needs related to housing resources Chronic Disease Management support and education needs related to HTN, asthma, COPD, LBP with left sided sciatica, HLD, depression 01/12/23:  Maxwell states he received housing resources from [employer]-1-2 year waiting list-[facility]W has f/u appt scheduled.  Does not check BP.  Attends PULM REHAB.  Also attends counseling every week.  Takes  Robaxin for LBP and is followed by PCP-pain goes from a 10-7.  To follow up on eye and dental resources to schedule an appt.  RNCM Clinical Goal(s):  verbalize understanding of plan for management of  HTN, asthma, COPD, LBP with left sided sciatica, HLD, depressionas evidenced by Maxwell report. verbalize basic understanding of  HTN, asthma, COPD, LBP with left sided sciatica, HLD, depression  disease process and self health management plan as evidenced by Maxwell report. take all medications exactly as prescribed and will call provider for medication related questions as evidenced by Maxwell report. demonstrate understanding of rationale for each prescribed medication as evidenced by Maxwell report. attend all scheduled medical  appointments: as evidenced by Maxwell report and EMR review. demonstrate ongoing  adherence to prescribed treatment plan for HTN, asthma, COPD, LBP with left sided sciatica, HLD, depression  as evidenced by Maxwell report and EMR review. continue to work with RN Care Manager to address care management and care coordination needs related to HTN, asthma, COPD, LBP with left sided sciatica, HLD, depression  as evidenced by adherence to CM Team Scheduled appointments work with Child psychotherapist to address housing needs related to the management of  HTN, asthma, COPD, LBP with left sided sciatica, HLD, depressionas evidenced by review of EMR and Maxwell or Child psychotherapist report through collaboration with Medical illustrator, provider, and care team.   Interventions: Evaluation of current treatment plan related to  self management and Maxwell's adherence to plan as established by provider  Asthma: (Status:New goal.) Long Term Goal Discussed the importance of adequate rest and management of fatigue with Asthma Assessed social determinant of health barriers  01/12/23: Maxwell currently attending PULM REHAB  COPD Interventions:  (Status:  New goal.) Long Term Goal Provided instruction about proper use of medications used for management of COPD including inhalers Discussed the importance of adequate rest and management of fatigue with COPD Assessed social determinant of health barriers 01/12/23: Maxwell currently attending PULM REHAB  Hyperlipidemia Interventions:  (Status:  New goal.) Long Term Goal Medication review performed; medication list updated in electronic medical record.  Provider established cholesterol goals reviewed Counseled on importance of regular laboratory monitoring as prescribed Reviewed importance of limiting foods high in cholesterol Assessed social determinant of health barriers   Hypertension Interventions:  (Status:  New goal.) Long Term Goal Last practice recorded BP readings:  BP  Readings from Last 3 Encounters:  12/03/22 (!) 142/89  11/04/22 (!) 162/114  10/26/22 138/80   Most recent eGFR/CrCl:  Lab Results  Component Value Date   EGFR 51 (L) 07/23/2022    No components found for: "CRCL"  Evaluation of current treatment plan related to hypertension self management and Maxwell's adherence to plan as established by provider Reviewed medications with Maxwell and discussed importance of compliance Discussed plans with Maxwell for ongoing care management follow up and provided Maxwell with direct contact information for care management team Advised Maxwell, providing education and rationale, to monitor blood pressure daily and record, calling PCP for findings outside established parameters Reviewed scheduled/upcoming provider appointments including:  Assessed social determinant of health barriers  Maxwell Goals/Self-Care Activities: Take all medications as prescribed Attend all scheduled provider appointments Call pharmacy for medication refills 3-7 days in advance of running out of medications Perform all self care activities independently  Perform IADL's (shopping, preparing meals, housekeeping, managing finances) independently Call provider office for new concerns or questions  Work with the social worker to address care coordination needs and will continue to  work with the clinical team to address health care and disease management related needs  Follow Up Plan:  The Maxwell has been provided with contact information for the care management team and has been advised to call with any health related questions or concerns.  The care management team will reach out to the Maxwell again over the next 30 business  days.     Long-Range Goal: Establish Plan of Care for Chronic Disease Management Needs   Priority: High  Note:   Timeframe:  Long-Range Goal Priority:  High Start Date:      01/12/23                       Expected End Date:   ongoing                     Follow Up Date 02/15/23    - practice safe sex - schedule appointment for vaccines needed due to my age or health - schedule recommended health tests  - schedule and keep appointment for annual check-up    Why is this important?   Screening tests can find diseases early when they are easier to treat.  Your doctor or nurse will talk with you about which tests are important for you.  Getting shots for common diseases like the flu and shingles will help prevent them.   01/12/23:  Maxwell to schedule  an eye and dental appt.   Follow Up:  Maxwell agrees to Care Plan and Follow-up.  Plan: The Managed Medicaid care management team will reach out to the Maxwell again over the next 30 business  days. and The  Maxwell has been provided with contact information for the Managed Medicaid care management team and has been advised to call with any health related questions or concerns.  Date/time of next scheduled RN care management/care coordination outreach: 02/15/23 at 1230.

## 2023-01-12 NOTE — Patient Instructions (Signed)
Hi Larry Maxwell, thank you for speaking with me this afternoon-I hope you feel better.  Larry Maxwell was given information about Medicaid Managed Care team care coordination services as a part of their [place] Medicaid benefit. Larry Maxwell verbally consented to engagement with the [facility] Managed Care team.   If you are experiencing a medical emergency, please call 911 or report to your local emergency department or urgent care.   If you have a non-emergency medical problem during routine business hours, please contact your provider's office and ask to speak with a nurse.   For questions related to your [place] health plan, please call: [phone]  If you would like to schedule transportation through your [place] Medicaid plan, please call the following number at least 2 days in advance of your appointment: [phone].   There is no limit to the number of trips during the year between medical appointments, healthcare facilities, or pharmacies. Transportation must be scheduled at least 2 business days before but not more than thirty 30 days before of your appointment.  Call the Behavioral Health Crisis Line at [phone], at any time, 24 hours a day, 7 days a week. If you are in danger or need immediate medical attention call 911.  If you would like help to quit smoking, call 1-800-QUIT-NOW (925-731-7746) OR Espaol: 1-855-Djelo-Ya (3-474-259-5638) o para ms informacin haga clic aqu or Text READY to 756-433 to register via text  Larry Maxwell - following are the goals we discussed in your visit today:  Timeframe:  Long-Range Goal Priority:  High Start Date:      01/12/23                       Expected End Date:   ongoing                    Follow Up Date 02/15/23    - practice safe sex - schedule appointment for vaccines needed due to my age or health - schedule recommended health tests  - schedule and keep appointment for annual  check-up    Why is this important?   Screening tests can find diseases early when they are easier to treat.  Your doctor or nurse will talk with you about which tests are important for you.  Getting shots for common diseases like the flu and shingles will help prevent them.   01/12/23:  patient to schedule  an eye and dental appt.  Patient verbalizes understanding of instructions and care plan provided today and agrees to view in MyChart. Active MyChart status and patient understanding of how to access instructions and care plan via MyChart confirmed with patient.     The Managed Medicaid care management team will reach out to the patient again over the next 30 business  days.  The  Patient  has been provided with contact information for the Managed Medicaid care management team and has been advised to call with any health related questions or concerns.   Kathi Der RN, BSN [name]  [facility] Care Management Coordinator - Managed Medicaid High Risk [phone]   Following is a copy of your plan of care:  Care Plan : RN Care Manager Plan of Care  Updates made by Danie Chandler, RN since 01/12/2023 12:00 AM     Problem: Health Promotion or Disease Self-Management (General Plan of Care)      Long-Range Goal: Chronic Disease Management and Care Coordination  Needs   Start Date: 01/12/2023  Expected End Date: 04/12/2023  Priority: High  Note:   Current Barriers:  Knowledge Deficits related to plan of care for management of HTN, asthma, COPD, LBP with left sided sciatica, HLD, depression Care Coordination needs related to housing resources Chronic Disease Management support and education needs related to HTN, asthma, COPD, LBP with left sided sciatica, HLD, depression 01/12/23:  Patient states he received housing resources from [facility]-1-2 year waiting list-[facility]W has f/u appt scheduled.  Does not check BP.  Attends PULM REHAB.  Also attends counseling every week.  Takes  Robaxin for LBP and is followed by PCP-pain goes from a 10-7.  To follow up on eye and dental resources to schedule an appt.  RNCM Clinical Goal(s):  verbalize understanding of plan for management of  HTN, asthma, COPD, LBP with left sided sciatica, HLD, depressionas evidenced by patient report. verbalize basic understanding of  HTN, asthma, COPD, LBP with left sided sciatica, HLD, depression  disease process and self health management plan as evidenced by patient report. take all medications exactly as prescribed and will call provider for medication related questions as evidenced by patient report. demonstrate understanding of rationale for each prescribed medication as evidenced by patient report. attend all scheduled medical appointments: as evidenced by patient report and EMR review. demonstrate ongoing  adherence to prescribed treatment plan for HTN, asthma, COPD, LBP with left sided sciatica, HLD, depression  as evidenced by patient report and EMR review. continue to work with RN Care Manager to address care management and care coordination needs related to HTN, asthma, COPD, LBP with left sided sciatica, HLD, depression  as evidenced by adherence to CM Team Scheduled appointments work with Child psychotherapist to address housing needs related to the management of  HTN, asthma, COPD, LBP with left sided sciatica, HLD, depressionas evidenced by review of EMR and patient or Child psychotherapist report through collaboration with Medical illustrator, provider, and care team.   Interventions: Evaluation of current treatment plan related to  self management and patient's adherence to plan as established by provider  Asthma: (Status:New goal.) Long Term Goal Discussed the importance of adequate rest and management of fatigue with Asthma Assessed social determinant of health barriers  01/12/23: patient currently attending PULM REHAB  COPD Interventions:  (Status:  New goal.) Long Term Goal Provided instruction  about proper use of medications used for management of COPD including inhalers Discussed the importance of adequate rest and management of fatigue with COPD Assessed social determinant of health barriers 01/12/23: patient currently attending PULM REHAB  Hyperlipidemia Interventions:  (Status:  New goal.) Long Term Goal Medication review performed; medication list updated in electronic medical record.  Provider established cholesterol goals reviewed Counseled on importance of regular laboratory monitoring as prescribed Reviewed importance of limiting foods high in cholesterol Assessed social determinant of health barriers   Hypertension Interventions:  (Status:  New goal.) Long Term Goal Last practice recorded BP readings:  BP Readings from Last 3 Encounters:  12/03/22 (!) 142/89  11/04/22 (!) 162/114  10/26/22 138/80   Most recent eGFR/CrCl:  Lab Results  Component Value Date   EGFR 51 (L) 07/23/2022    No components found for: "CRCL"  Evaluation of current treatment plan related to hypertension self management and patient's adherence to plan as established by provider Reviewed medications with patient and discussed importance of compliance Discussed plans with patient for ongoing care management follow up and provided patient with direct contact information for care  management team Advised patient, providing education and rationale, to monitor blood pressure daily and record, calling PCP for findings outside established parameters Reviewed scheduled/upcoming provider appointments including:  Assessed social determinant of health barriers  Patient Goals/Self-Care Activities: Take all medications as prescribed Attend all scheduled provider appointments Call pharmacy for medication refills 3-7 days in advance of running out of medications Perform all self care activities independently  Perform IADL's (shopping, preparing meals, housekeeping, managing finances) independently Call  provider office for new concerns or questions  Work with the social worker to address care coordination needs and will continue to work with the clinical team to address health care and disease management related needs  Follow Up Plan:  The patient has been provided with contact information for the care management team and has been advised to call with any health related questions or concerns.  The care management team will reach out to the patient again over the next 30 business  days.

## 2023-01-12 NOTE — Progress Notes (Signed)
Pulmonary Individual Treatment Plan  Patient Details  Name: TISHON LOOMIS MRN: [id number] Date of Birth: 1965/02/02 Referring Provider:   Flowsheet Row Pulmonary Rehab from 11/11/2022 in [facility] Cardiac and Pulmonary Rehab  Referring Provider Jean-Pierre Assaker       Initial Encounter Date:  Flowsheet Row Pulmonary Rehab from 11/11/2022 in [facility] Cardiac and Pulmonary Rehab  Date 11/11/22       Visit Diagnosis: Chronic obstructive pulmonary disease, unspecified COPD type (HCC)  Patient's Home Medications on Admission:  Current Outpatient Medications:    albuterol (VENTOLIN HFA) 108 (90 Base) MCG/ACT inhaler, Inhale 2 puffs into the lungs every 6 (six) hours as needed for wheezing or shortness of breath., Disp: 18 g, Rfl: 1   amLODipine (NORVASC) 10 MG tablet, Take 1 tablet (10 mg total) by mouth daily for 30 doses., Disp: 30 tablet, Rfl: 2   benzonatate (TESSALON) 100 MG capsule, Take 1 capsule (100 mg total) by mouth 2 (two) times daily as needed for cough., Disp: 20 capsule, Rfl: 0   Budeson-Glycopyrrol-Formoterol (BREZTRI AEROSPHERE) 160-9-4.8 MCG/ACT AERO, Inhale 2 puffs into the lungs in the morning and at bedtime., Disp: , Rfl:    celecoxib (CELEBREX) 100 MG capsule, Take 1 capsule (100 mg total) by mouth 2 (two) times daily., Disp: 60 capsule, Rfl: 2   cetirizine (ZYRTEC) 10 MG tablet, Take 1 tablet (10 mg total) by mouth daily., Disp: 30 tablet, Rfl: 11   chlorthalidone (HYGROTON) 25 MG tablet, TAKE 1 TABLET (25 MG TOTAL) BY MOUTH DAILY., Disp: 90 tablet, Rfl: 0   chlorthalidone (HYGROTON) 25 MG tablet, Take 1 tablet (25 mg total) by mouth daily., Disp: 90 tablet, Rfl: 0   gabapentin (NEURONTIN) 300 MG capsule, Take 1 capsule (300 mg total) by mouth in the morning,AND 1 capsule (300 mg total) daily at midday, AND 2 capsules (600 mg total) at bedtime., Disp: 120 capsule, Rfl: 0   ipratropium-albuterol (DUONEB) 0.5-2.5 (3) MG/3ML SOLN, Take 3 mLs by nebulization every 6 (six)  hours as needed., Disp: 360 mL, Rfl: 3   methocarbamol (ROBAXIN) 500 MG tablet, Take 1 tablet (500 mg total) by mouth every 6 (six) hours as needed for muscle spasms., Disp: 20 tablet, Rfl: 0   metoprolol succinate (TOPROL-XL) 25 MG 24 hr tablet, Take 1 tablet (25 mg total) by mouth daily., Disp: 90 tablet, Rfl: 0   montelukast (SINGULAIR) 10 MG tablet, Take 1 tablet (10 mg total) by mouth at bedtime., Disp: 30 tablet, Rfl: 0   rosuvastatin (CRESTOR) 10 MG tablet, Take 1 tablet (10 mg total) by mouth daily., Disp: 90 tablet, Rfl: 3   sertraline (ZOLOFT) 50 MG tablet, Take 1 tablet (50 mg total) by mouth daily., Disp: 30 tablet, Rfl: 3   valsartan (DIOVAN) 40 MG tablet, Take 1 tablet (40 mg total) by mouth daily. Start taking after one week, if top BP number is >125, Disp: 30 tablet, Rfl: 0  Past Medical History: Past Medical History:  Diagnosis Date   Allergy    Asthma    Depression    Gynecomastia, male 07/21/2022   Hypertension     Tobacco Use: Social History   Tobacco Use  Smoking Status Former   Current packs/day: 0.00   Average packs/day: 1.5 packs/day for 20.0 years (30.0 ttl pk-yrs)   Types: Cigarettes   Start date: 12/19/2001   Quit date: 12/19/2021   Years since quitting: 1.0  Smokeless Tobacco Never    Labs: Review Flowsheet       Latest  Ref Rng & Units 07/23/2022  Labs for [facility]P Cardiac and Pulmonary Rehab  Cholestrol 100 - 199 mg/dL 540   LDL (calc) 0 - 99 mg/dL 981   HDL-C >19 mg/dL 47   Trlycerides 0 - 147 mg/dL 829      Pulmonary Assessment Scores:  Pulmonary Assessment Scores     Row Name 11/11/22 1325         ADL UCSD   ADL Phase Entry     SOB Score total 53     Rest 4     Walk 3     Stairs 3     Bath 4     Dress 2     Shop 0       CAT Score   CAT Score 31       mMRC Score   mMRC Score 2              UCSD: Self-administered rating of dyspnea associated with activities of daily living (ADLs) 6-point scale (0 = "not at all" to 5 =  "maximal or unable to do because of breathlessness")  Scoring Scores range from 0 to 120.  Minimally important difference is 5 units  CAT: CAT can identify the health impairment of COPD patients and is better correlated with disease progression.  CAT has a scoring range of zero to 40. The CAT score is classified into four groups of low (less than 10), medium (10 - 20), high (21-30) and very high (31-40) based on the impact level of disease on health status. A CAT score over 10 suggests significant symptoms.  A worsening CAT score could be explained by an exacerbation, poor medication adherence, poor inhaler technique, or progression of COPD or comorbid conditions.  CAT MCID is 2 points  mMRC: mMRC (Modified Medical Research Council) Dyspnea Scale is used to assess the degree of baseline functional disability in patients of respiratory disease due to dyspnea. No minimal important difference is established. A decrease in score of 1 point or greater is considered a positive change.   Pulmonary Function Assessment:  Pulmonary Function Assessment - 11/08/22 0938       Breath   Shortness of Breath Yes             Exercise Target Goals: Exercise Program Goal: Individual exercise prescription set using results from initial 6 min walk test and THRR while considering  patient's activity barriers and safety.   Exercise Prescription Goal: Initial exercise prescription builds to 30-45 minutes a day of aerobic activity, 2-3 days per week.  Home exercise guidelines will be given to patient during program as part of exercise prescription that the participant will acknowledge.  Education: Aerobic Exercise: - Group verbal and visual presentation on the components of exercise prescription. Introduces F.I.T.T principle from ACSM for exercise prescriptions.  Reviews F.I.T.T. principles of aerobic exercise including progression. Written material given at graduation.   Education: Resistance Exercise: -  Group verbal and visual presentation on the components of exercise prescription. Introduces F.I.T.T principle from ACSM for exercise prescriptions  Reviews F.I.T.T. principles of resistance exercise including progression. Written material given at graduation. Flowsheet Row Pulmonary Rehab from 01/06/2023 in [facility] Cardiac and Pulmonary Rehab  Date 11/18/22  Educator MB  Instruction Review Code 1- [employer] Understanding        Education: Exercise & Equipment Safety: - Individual verbal instruction and demonstration of equipment use and safety with use of the equipment. Flowsheet Row Pulmonary Rehab from 01/06/2023 in [facility] Cardiac  and Pulmonary Rehab  Date 11/11/22  Educator NT  Instruction Review Code 1- Verbalizes Understanding       Education: Exercise Physiology & General Exercise Guidelines: - Group verbal and written instruction with models to review the exercise physiology of the cardiovascular system and associated critical values. Provides general exercise guidelines with specific guidelines to those with heart or lung disease.    Education: Flexibility, Balance, Mind/Body Relaxation: - Group verbal and visual presentation with interactive activity on the components of exercise prescription. Introduces F.I.T.T principle from ACSM for exercise prescriptions. Reviews F.I.T.T. principles of flexibility and balance exercise training including progression. Also discusses the mind body connection.  Reviews various relaxation techniques to help reduce and manage stress (i.e. Deep breathing, progressive muscle relaxation, and visualization). Balance handout provided to take home. Written material given at graduation. Flowsheet Row Pulmonary Rehab from 01/06/2023 in [facility] Cardiac and Pulmonary Rehab  Date 11/18/22  Educator MB  Instruction Review Code 1- Verbalizes Understanding       Activity Barriers & Risk Stratification:  Activity Barriers & Cardiac Risk Stratification - 11/11/22  1332       Activity Barriers & Cardiac Risk Stratification   Activity Barriers Back Problems;Shortness of Breath;Other (comment)    Comments Sciatica L nerve             6 Minute Walk:  6 Minute Walk     Row Name 11/11/22 1329         6 Minute Walk   Phase Initial     Distance 1030 feet     Walk Time 6 minutes     # of Rest Breaks 0     MPH 1.95     METS 3.65     RPE 12     Perceived Dyspnea  2     VO2 Peak 12.78     Symptoms Yes (comment)     Comments cough, chest toghtness/pain 7/10     Resting HR 78 bpm     Resting BP 124/78     Resting Oxygen Saturation  98 %     Exercise Oxygen Saturation  during 6 min walk 94 %     Max Ex. HR 107 bpm     Max Ex. BP 148/84     2 Minute Post BP 128/76       Interval HR   1 Minute HR 107     2 Minute HR 100     3 Minute HR 103     4 Minute HR 99     5 Minute HR 103     6 Minute HR 96     2 Minute Post HR 87     Interval Heart Rate? Yes       Interval Oxygen   Interval Oxygen? Yes     Baseline Oxygen Saturation % 98 %     1 Minute Oxygen Saturation % 98 %     1 Minute Liters of Oxygen 0 L  RA     2 Minute Oxygen Saturation % 99 %     2 Minute Liters of Oxygen 0 L     3 Minute Oxygen Saturation % 98 %     3 Minute Liters of Oxygen 0 L     4 Minute Oxygen Saturation % 94 %     4 Minute Liters of Oxygen 0 L     5 Minute Oxygen Saturation % 97 %     5 Minute Liters of  Oxygen 0 L     6 Minute Oxygen Saturation % 98 %     6 Minute Liters of Oxygen 0 L     2 Minute Post Oxygen Saturation % 99 %     2 Minute Post Liters of Oxygen 0 L             Oxygen Initial Assessment:  Oxygen Initial Assessment - 11/08/22 0938       Home Oxygen   Home Oxygen Device None    Sleep Oxygen Prescription None    Home Exercise Oxygen Prescription None    Home Resting Oxygen Prescription None      Initial 6 min Walk   Oxygen Used None      Program Oxygen Prescription   Program Oxygen Prescription None      Intervention    Short Term Goals To learn and understand importance of maintaining oxygen saturations>88%;To learn and understand importance of monitoring SPO2 with pulse oximeter and demonstrate accurate use of the pulse oximeter.;To learn and demonstrate proper pursed lip breathing techniques or other breathing techniques. ;To learn and demonstrate proper use of respiratory medications    Long  Term Goals Verbalizes importance of monitoring SPO2 with pulse oximeter and return demonstration;Maintenance of O2 saturations>88%;Exhibits proper breathing techniques, such as pursed lip breathing or other method taught during program session;Compliance with respiratory medication;Demonstrates proper use of MDI's             Oxygen Re-Evaluation:  Oxygen Re-Evaluation     Row Name 11/16/22 0832 12/02/22 0738 01/06/23 0757         Program Oxygen Prescription   Program Oxygen Prescription None None None       Home Oxygen   Home Oxygen Device None None None     Sleep Oxygen Prescription None None None     Home Exercise Oxygen Prescription None None None     Home Resting Oxygen Prescription None None None     Compliance with Home Oxygen Use Yes -- --       Goals/Expected Outcomes   Short Term Goals To learn and demonstrate proper pursed lip breathing techniques or other breathing techniques.  To learn and demonstrate proper pursed lip breathing techniques or other breathing techniques.  To learn and understand importance of maintaining oxygen saturations>88%     Long  Term Goals Exhibits proper breathing techniques, such as pursed lip breathing or other method taught during program session Exhibits proper breathing techniques, such as pursed lip breathing or other method taught during program session Maintenance of O2 saturations>88%     Comments Reviewed PLB technique with pt.  Talked about how it works and it's importance in maintaining their exercise saturations. Informed patient how to perform the Pursed  Lipped breathing technique. Told patient to Inhale through the nose and out the mouth with pursed lips to keep their airways open, help oxygenate them better, practice when at rest or doing strenuous activity. Patient Verbalizes understanding of technique and will work on and be reiterated during [facility]. He does not have a pulse oximeter to check his oxygen saturation at home. Informed him where to get one and explained why it is important to have one. Reviewed that oxygen saturations should be 88 percent and above.     Goals/Expected Outcomes Short: Become more profiecient at using PLB. Long: Become independent at using PLB. Short: use PLB with exertion. Long: use PLB on exertion proficiently and independently. Short: monitor oxygen at home with exertion. Long:  maintain oxygen saturations above 88 percent independently.              Oxygen Discharge (Final Oxygen Re-Evaluation):  Oxygen Re-Evaluation - 01/06/23 0757       Program Oxygen Prescription   Program Oxygen Prescription None      Home Oxygen   Home Oxygen Device None    Sleep Oxygen Prescription None    Home Exercise Oxygen Prescription None    Home Resting Oxygen Prescription None      Goals/Expected Outcomes   Short Term Goals To learn and understand importance of maintaining oxygen saturations>88%    Long  Term Goals Maintenance of O2 saturations>88%    Comments He does not have a pulse oximeter to check his oxygen saturation at home. Informed him where to get one and explained why it is important to have one. Reviewed that oxygen saturations should be 88 percent and above.    Goals/Expected Outcomes Short: monitor oxygen at home with exertion. Long: maintain oxygen saturations above 88 percent independently.             Initial Exercise Prescription:  Initial Exercise Prescription - 11/11/22 1300       Date of Initial Exercise RX and Referring Provider   Date 11/11/22    Referring Provider [place] Assaker       Oxygen   Maintain Oxygen Saturation 88% or higher      Treadmill   MPH 1.8    Grade 0.5    Minutes 15    METs 2.5      NuStep   Level 2   T6 nustep   SPM 80    Minutes 15    METs 3.65      REL-XR   Level 3    Speed 50    Minutes 15    METs 3.65      Prescription Details   Frequency (times per week) 2    Duration Progress to 30 minutes of continuous aerobic without signs/symptoms of physical distress      Intensity   THRR 40-80% of Max Heartrate 112-146    Ratings of Perceived Exertion 11-13    Perceived Dyspnea 0-4      Progression   Progression Continue to progress workloads to maintain intensity without signs/symptoms of physical distress.      Resistance Training   Training Prescription Yes    Weight 4 lb    Reps 10-15             Perform Capillary Blood Glucose checks as needed.  Exercise Prescription Changes:   Exercise Prescription Changes     Row Name 11/11/22 1300 12/02/22 1600 12/16/22 0900 12/28/22 0900 12/29/22 1000     Response to Exercise   Blood Pressure (Admit) 124/78 134/74 138/70 -- 122/68   Blood Pressure (Exercise) 148/84 178/100 146/84 -- 130/70   Blood Pressure (Exit) 128/76 142/90 136/74 -- 118/92   Heart Rate (Admit) 78 bpm 61 bpm 70 bpm -- 77 bpm   Heart Rate (Exercise) 107 bpm 110 bpm 105 bpm -- 110 bpm   Heart Rate (Exit) 87 bpm 63 bpm 98 bpm -- 96 bpm   Oxygen Saturation (Admit) 98 % 97 % 98 % -- --   Oxygen Saturation (Exercise) 94 % 96 % 95 % -- --   Oxygen Saturation (Exit) 99 % 98 % 99 % -- --   Rating of Perceived Exertion (Exercise) 12 15 15  -- 14   Perceived Dyspnea (Exercise) 2 2  3 -- 0   Symptoms cogh, chest tightness 7/10 none none -- none   Comments Results -- -- -- --   Duration -- Progress to 30 minutes of  aerobic without signs/symptoms of physical distress Progress to 30 minutes of  aerobic without signs/symptoms of physical distress -- Progress to 30 minutes of  aerobic without signs/symptoms of  physical distress   Intensity -- THRR unchanged THRR unchanged -- THRR unchanged     Progression   Progression -- Continue to progress workloads to maintain intensity without signs/symptoms of physical distress. Continue to progress workloads to maintain intensity without signs/symptoms of physical distress. -- Continue to progress workloads to maintain intensity without signs/symptoms of physical distress.   Average METs -- 1.9 2.75 -- 3.6     Resistance Training   Training Prescription -- Yes Yes -- Yes   Weight -- 4 lb 4 lb -- 4 lb   Reps -- 10-15 10-15 -- 10-15     Interval Training   Interval Training -- No No -- No     Treadmill   MPH -- 1.5 2.5 -- 3   Grade -- 0.5 0 -- 1.5   Minutes -- 15 15 -- 15   METs -- 2.25 2.91 -- 3.92     NuStep   Level -- 1  T6 3  T6 -- 1   Minutes -- 15 15 -- 15   METs -- 1.6 2.1 -- 2.7     REL-XR   Level -- 1 2 -- 3   Minutes -- 15 15 -- 15   METs -- -- 3.1 -- 4.4     T5 Nustep   Level -- -- -- -- 3   Minutes -- -- -- -- 15     Home Exercise Plan   Plans to continue exercise at -- -- -- Home (comment)  [name] plans to walk everyday of the week but states that he would walk for longer than normal on days that he doesnt come to rehab (30-45 min) Home (comment)  [name] plans to walk everyday of the week but states that he would walk for longer than normal on days that he doesnt come to rehab (30-45 min)   Frequency -- -- -- Add 3 additional days to program exercise sessions. Add 3 additional days to program exercise sessions.   Initial Home Exercises Provided -- -- -- 12/28/22 12/28/22     Oxygen   Maintain Oxygen Saturation -- 88% or higher 88% or higher -- 88% or higher            Exercise Comments:   Exercise Comments     Row Name 11/16/22 0831           Exercise Comments First full day of exercise!  Patient was oriented to gym and equipment including functions, settings, policies, and procedures.  Patient's individual exercise  prescription and treatment plan were reviewed.  All starting workloads were established based on the results of the 6 minute walk test done at initial orientation visit.  The plan for exercise progression was also introduced and progression will be customized based on patient's performance and goals.                Exercise Goals and Review:   Exercise Goals     Row Name 11/11/22 1332             Exercise Goals   Increase Physical Activity Yes       Intervention  Provide advice, education, support and counseling about physical activity/exercise needs.;Develop an individualized exercise prescription for aerobic and resistive training based on initial evaluation findings, risk stratification, comorbidities and participant's personal goals.       Expected Outcomes Short Term: Attend rehab on a regular basis to increase amount of physical activity.;Long Term: Add in home exercise to make exercise part of routine and to increase amount of physical activity.;Long Term: Exercising regularly at least 3-5 days a week.       Increase Strength and Stamina Yes       Intervention Provide advice, education, support and counseling about physical activity/exercise needs.;Develop an individualized exercise prescription for aerobic and resistive training based on initial evaluation findings, risk stratification, comorbidities and participant's personal goals.       Expected Outcomes Short Term: Increase workloads from initial exercise prescription for resistance, speed, and METs.;Short Term: Perform resistance training exercises routinely during rehab and add in resistance training at home;Long Term: Improve cardiorespiratory fitness, muscular endurance and strength as measured by increased METs and functional capacity ( )       Able to understand and use rate of perceived exertion (RPE) scale Yes       Intervention Provide education and explanation on how to use RPE scale       Expected Outcomes Short  Term: Able to use RPE daily in rehab to express subjective intensity level;Long Term:  Able to use RPE to guide intensity level when exercising independently       Able to understand and use Dyspnea scale Yes       Intervention Provide education and explanation on how to use Dyspnea scale       Expected Outcomes Short Term: Able to use Dyspnea scale daily in rehab to express subjective sense of shortness of breath during exertion;Long Term: Able to use Dyspnea scale to guide intensity level when exercising independently       Knowledge and understanding of Target Heart Rate Range (THRR) Yes       Intervention Provide education and explanation of THRR including how the numbers were predicted and where they are located for reference       Expected Outcomes Short Term: Able to state/look up THRR;Long Term: Able to use THRR to govern intensity when exercising independently;Short Term: Able to use daily as guideline for intensity in rehab       Able to check pulse independently Yes       Intervention Provide education and demonstration on how to check pulse in carotid and radial arteries.;Review the importance of being able to check your own pulse for safety during independent exercise       Expected Outcomes Short Term: Able to explain why pulse checking is important during independent exercise;Long Term: Able to check pulse independently and accurately       Understanding of Exercise Prescription Yes       Intervention Provide education, explanation, and written materials on patient's individual exercise prescription       Expected Outcomes Short Term: Able to explain program exercise prescription;Long Term: Able to explain home exercise prescription to exercise independently                Exercise Goals Re-Evaluation :  Exercise Goals Re-Evaluation     Row Name 11/16/22 0831 12/02/22 1628 12/16/22 0941 12/28/22 0927 12/29/22 1101     Exercise Goal Re-Evaluation   Exercise Goals Review --  Increase Physical Activity;Increase Strength and Stamina;Understanding of Exercise Prescription Increase Physical  Activity;Increase Strength and Stamina;Understanding of Exercise Prescription Increase Physical Activity;Able to understand and use Dyspnea scale;Understanding of Exercise Prescription;Knowledge and understanding of Target Heart Rate Range (THRR);Increase Strength and Stamina;Able to check pulse independently;Able to understand and use rate of perceived exertion (RPE) scale Increase Physical Activity;Increase Strength and Stamina;Understanding of Exercise Prescription   Comments Reviewed RPE and dyspnea scale, THR and program prescription with pt today.  Pt voiced understanding and was given a copy of goals to take home. Kaidin is off to a great start in the program. He has been getting familiar with his exercise prescription, and the machines involved. He was able to use the readmill at a speed of 1.5 mph and an incline of 0.5%, and use the T6 nustep and XR at level 1. We will continue to monitor his progress in the program. Baris continues to do well in the program. He has been able to make improvements in his speed on the treadmill, going from a speed of 1.5 mph to 2.5 mph. He was also able to increase his level on the T6 nustep from level 1 to level 3. We will continue to monitor his progress in the program. Reviewed home exercise with pt today from 7:40 to 7:50.  Pt plans to walk outside at home for at least 30 minutes, 3 days per week for exercise.  Reviewed THR, pulse, RPE, sign and symptoms, pulse oximetery and when to call 911 or MD.  Also discussed weather considerations and indoor options.  Pt voiced understanding. Thristan continues to do well in rehab. He has been able to increase his level on the XR to level 3. He has also increased his workload on the treadmill from 2.5 to and 0 to 1.5% incline. We will continue to monitor his progress in the program.   Expected Outcomes Short: Use RPE  daily to regulate intensity. Long: Follow program prescription in THR. Short: Continue to follow current exercise prescription. Long: Continue exercise to improve strength and stamina. Short: Continue to follow current exercise prescription. Long: Continue exercise to improve strength and stamina. Short: Continue to implement home exercise. Long: Continue exercising to improve strength and stamina. Short: Continue to progressively increase treadmill workload. Long: Continue exercise to improve strength and stamina.    Row Name 01/04/23 0805             Exercise Goal Re-Evaluation   Exercise Goals Review Understanding of Exercise Prescription;Able to check pulse independently;Knowledge and understanding of Target Heart Rate Range (THRR);Able to understand and use Dyspnea scale;Able to understand and use rate of perceived exertion (RPE) scale;Increase Strength and Stamina;Increase Physical Activity       Comments Ferlando continues to walk for 45 minutes outside everyday. He states that he is seeing a lot of progress and continues to increase his workloads.       Expected Outcomes Short: Continue to exercise independently. Long: Continue exercise to improve his strength and stamina.                Discharge Exercise Prescription (Final Exercise Prescription Changes):  Exercise Prescription Changes - 12/29/22 1000       Response to Exercise   Blood Pressure (Admit) 122/68    Blood Pressure (Exercise) 130/70    Blood Pressure (Exit) 118/92    Heart Rate (Admit) 77 bpm    Heart Rate (Exercise) 110 bpm    Heart Rate (Exit) 96 bpm    Rating of Perceived Exertion (Exercise) 14    Perceived Dyspnea (  Exercise) 0    Symptoms none    Duration Progress to 30 minutes of  aerobic without signs/symptoms of physical distress    Intensity THRR unchanged      Progression   Progression Continue to progress workloads to maintain intensity without signs/symptoms of physical distress.    Average METs 3.6       Resistance Training   Training Prescription Yes    Weight 4 lb    Reps 10-15      Interval Training   Interval Training No      Treadmill   MPH 3    Grade 1.5    Minutes 15    METs 3.92      NuStep   Level 1    Minutes 15    METs 2.7      REL-XR   Level 3    Minutes 15    METs 4.4      T5 Nustep   Level 3    Minutes 15      Home Exercise Plan   Plans to continue exercise at Home (comment)   [name] plans to walk everyday of the week but states that he would walk for longer than normal on days that he doesnt come to rehab (30-45 min)   Frequency Add 3 additional days to program exercise sessions.    Initial Home Exercises Provided 12/28/22      Oxygen   Maintain Oxygen Saturation 88% or higher             Nutrition:  Target Goals: Understanding of nutrition guidelines, daily intake of sodium 1500mg , cholesterol 200mg , calories 30% from fat and 7% or less from saturated fats, daily to have 5 or more servings of fruits and vegetables.  Education: All About Nutrition: -Group instruction provided by verbal, written material, interactive activities, discussions, models, and posters to present general guidelines for heart healthy nutrition including fat, fiber, MyPlate, the role of sodium in heart healthy nutrition, utilization of the nutrition label, and utilization of this knowledge for meal planning. Follow up email sent as well. Written material given at graduation. Flowsheet Row Pulmonary Rehab from 01/06/2023 in [facility] Cardiac and Pulmonary Rehab  Education need identified 11/11/22  Date 12/09/22  Educator JG  Instruction Review Code 1- Verbalizes Understanding       Biometrics:  Pre Biometrics - 11/11/22 1333       Pre Biometrics   Height 6\' 1"  (1.854 m)    Weight 190 lb 8 oz (86.4 kg)    Waist Circumference 40 inches    Hip Circumference 41 inches    Waist to Hip Ratio 0.98 %    BMI (Calculated) 25.14    Single Leg Stand 10.4 seconds               Nutrition Therapy Plan and Nutrition Goals:  Nutrition Therapy & Goals - 11/16/22 0947       Nutrition Therapy   Diet Mediterranean    Protein (specify units) 75-90    Fiber 30 grams    Whole Grain Foods 3 servings    Saturated Fats 15 max. grams    Fruits and Vegetables 5 servings/day    Sodium 2 grams      Personal Nutrition Goals   Nutrition Goal Eat something balanced at every meal hour    Personal Goal #2 Include healthy fats to help promote health and meet calorie needs    Personal Goal #3 Meet with RD again in late  Nov    Comments Patient drinking 64oz of water daily. He wants to learn how to adopt a healthy eating style. Reviewed mediterranean diet handout with him, educating on types of fats, sources, and how to read labels. He is inconsistent with eating, sometimes overeating, or snacking on junk, sometimes missing meals. Explained the importance of consistency and structure. Built out several meals and snacks with foods he likes and will eat focusing on nutrient and calorie dense foods that could be eating in small quantities with little prep work to help him be more consistent at meeting nutritional needs even when his appetite is poor. Explained that this will require continued work, he has agreed to meet again in ~68month to reassess and work through any barriers that arise while making these changes.      Intervention Plan   Intervention Prescribe, educate and counsel regarding individualized specific dietary modifications aiming towards targeted core components such as weight, hypertension, lipid management, diabetes, heart failure and other comorbidities.;Nutrition handout(s) given to patient.    Expected Outcomes Short Term Goal: Understand basic principles of dietary content, such as calories, fat, sodium, cholesterol and nutrients.;Short Term Goal: A plan has been developed with personal nutrition goals set during dietitian appointment.;Long Term Goal: Adherence  to prescribed nutrition plan.             Nutrition Assessments:  MEDIFICTS Score Key: >=70 Need to make dietary changes  40-70 Heart Healthy Diet <= 40 Therapeutic Level Cholesterol Diet  Flowsheet Row Pulmonary Rehab from 11/11/2022 in [facility] Cardiac and Pulmonary Rehab  Picture Your Plate Total Score on Admission 41      Picture Your Plate Scores: <84 Unhealthy dietary pattern with much room for improvement. 41-50 Dietary pattern unlikely to meet recommendations for good health and room for improvement. 51-60 More healthful dietary pattern, with some room for improvement.  >60 Healthy dietary pattern, although there may be some specific behaviors that could be improved.   Nutrition Goals Re-Evaluation:  Nutrition Goals Re-Evaluation     Row Name 12/02/22 0745 01/04/23 6962           Goals   Current Weight 186 lb (84.4 kg) 197 lb 6.4 oz (89.5 kg)      Nutrition Goal -- Eat something balanced at every meal hour      Comment Patient was informed on why it is important to maintain a balanced diet when dealing with Respiratory issues. Explained that it takes a lot of energy to breath and when they are short of breath often they will need to have a good diet to help keep up with the calories they are expending for breathing. Patient was informed on why it is important to maintain a balanced diet when dealing with Respiratory issues. Explained that it takes a lot of energy to breath and when they are short of breath often they will need to have a good diet to help keep up with the calories they are expending for breathing.      Expected Outcome Short: Choose and plan snacks accordingly to patients caloric intake to improve breathing. Long: Maintain a diet independently that meets their caloric intake to aid in daily shortness of breath. Short: Choose and plan snacks accordingly to patients caloric intake to improve breathing. Long: Maintain a diet independently that meets their  caloric intake to aid in daily shortness of breath.        Personal Goal #2 Re-Evaluation   Personal Goal #2 -- Include healthy fats  to help promote health and meet calorie needs        Personal Goal #3 Re-Evaluation   Personal Goal #3 -- Meet with RD again in late Nov               Nutrition Goals Discharge (Final Nutrition Goals Re-Evaluation):  Nutrition Goals Re-Evaluation - 01/04/23 1610       Goals   Current Weight 197 lb 6.4 oz (89.5 kg)    Nutrition Goal Eat something balanced at every meal hour    Comment Patient was informed on why it is important to maintain a balanced diet when dealing with Respiratory issues. Explained that it takes a lot of energy to breath and when they are short of breath often they will need to have a good diet to help keep up with the calories they are expending for breathing.    Expected Outcome Short: Choose and plan snacks accordingly to patients caloric intake to improve breathing. Long: Maintain a diet independently that meets their caloric intake to aid in daily shortness of breath.      Personal Goal #2 Re-Evaluation   Personal Goal #2 Include healthy fats to help promote health and meet calorie needs      Personal Goal #3 Re-Evaluation   Personal Goal #3 Meet with RD again in late Nov             Psychosocial: Target Goals: Acknowledge presence or absence of significant depression and/or stress, maximize coping skills, provide positive support system. Participant is able to verbalize types and ability to use techniques and skills needed for reducing stress and depression.   Education: Stress, Anxiety, and Depression - Group verbal and visual presentation to define topics covered.  Reviews how body is impacted by stress, anxiety, and depression.  Also discusses healthy ways to reduce stress and to treat/manage anxiety and depression.  Written material given at graduation.   Education: Sleep Hygiene -Provides group verbal and  written instruction about how sleep can affect your health.  Define sleep hygiene, discuss sleep cycles and impact of sleep habits. Review good sleep hygiene tips.    Initial Review & Psychosocial Screening:  Initial Psych Review & Screening - 11/08/22 0940       Initial Review   Current issues with Current Sleep Concerns;Current Anxiety/Panic      Family Dynamics   Good Support System? No    Strains Intra-family strains    Comments He states he does not have a great family support system. He preferes to deal with his stressors himself. At night he has trouble sleeping and gets shot of breath.      Barriers   Psychosocial barriers to participate in program The patient should benefit from training in stress management and relaxation.;There are no identifiable barriers or psychosocial needs.      Screening Interventions   Interventions Encouraged to exercise;To provide support and resources with identified psychosocial needs;Provide feedback about the scores to participant    Expected Outcomes Short Term goal: Utilizing psychosocial counselor, staff and physician to assist with identification of specific Stressors or current issues interfering with healing process. Setting desired goal for each stressor or current issue identified.;Long Term Goal: Stressors or current issues are controlled or eliminated.;Short Term goal: Identification and review with participant of any Quality of Life or Depression concerns found by scoring the questionnaire.;Long Term goal: The participant improves quality of Life and PHQ9 Scores as seen by post scores and/or verbalization of changes  Quality of Life Scores:  Scores of 19 and below usually indicate a poorer quality of life in these areas.  A difference of  2-3 points is a clinically meaningful difference.  A difference of 2-3 points in the total score of the Quality of Life Index has been associated with significant improvement in overall  quality of life, self-image, physical symptoms, and general health in studies assessing change in quality of life.  PHQ-9: Review Flowsheet  More data exists      01/04/2023 12/03/2022 12/02/2022 11/26/2022 11/22/2022  Depression screen PHQ 2/9  Decreased Interest 1 2 2 2 2   Down, Depressed, Hopeless 1 2 2 2 3   PHQ - 2 Score 2 4 4 4 5   Altered sleeping 2 3 3 3 3   Tired, decreased energy 2 3 2 2 1   Change in appetite 1 2 0 0 0  Feeling bad or failure about yourself  1 2 0 0 0  Trouble concentrating 0 2 0 0 -  Moving slowly or fidgety/restless 0 1 0 0 0  Suicidal thoughts 0 1 0 0 1  PHQ-9 Score 8 18 9 9 10   Difficult doing work/chores Somewhat difficult - Somewhat difficult - -   Interpretation of Total Score  Total Score Depression Severity:  1-4 = Minimal depression, 5-9 = Mild depression, 10-14 = Moderate depression, 15-19 = Moderately severe depression, 20-27 = Severe depression   Psychosocial Evaluation and Intervention:  Psychosocial Evaluation - 11/08/22 0945       Psychosocial Evaluation & Interventions   Interventions Encouraged to exercise with the program and follow exercise prescription;Relaxation education;Stress management education    Comments He states he does not have a great family support system. He preferes to deal with his stressors himself. At night he has trouble sleeping and gets shot of breath.    Expected Outcomes Short: Start [facility] to help with mood. Long: Maintain a healthy mental state.    Continue Psychosocial Services  Follow up required by staff             Psychosocial Re-Evaluation:  Psychosocial Re-Evaluation     Row Name 12/02/22 0748 01/04/23 9604           Psychosocial Re-Evaluation   Current issues with Current Stress Concerns Current Stress Concerns      Comments Reviewed patient health questionnaire (PHQ-9) with patient for follow up. Previously, patients score indicated signs/symptoms of depression.  Reviewed to see if  patient is improving symptom wise while in program.  Score improved and patient states that it is because he is taking care of his health more and seeing a therapist. Reviewed patient health questionnaire (PHQ-9) with patient for follow up. Previously, patients score indicated signs/symptoms of depression.  Reviewed to see if patient is improving symptom wise while in program.  Score improved and patient states that it is because he is taking care of his health more and seeing a therapist. Patient states that after his diagnosis with stage 4 lung cancer he dealt with a lot of depression. He states that his continuous exercise has helped him mentally, he also has a therapist that he sees once a week that helps him talk about his situation.      Expected Outcomes Short: Continue to attend [facility] regularly for regular exercise and social engagement. Long: Continue to improve symptoms and manage a positive mental state. Short: Continue to attend [facility] regularly for regular exercise and social engagement. Long: Continue to improve symptoms and manage a  positive mental state.      Interventions Encouraged to attend Pulmonary Rehabilitation for the exercise Encouraged to attend Pulmonary Rehabilitation for the exercise      Continue Psychosocial Services  Follow up required by staff Follow up required by staff               Psychosocial Discharge (Final Psychosocial Re-Evaluation):  Psychosocial Re-Evaluation - 01/04/23 0812       Psychosocial Re-Evaluation   Current issues with Current Stress Concerns    Comments Reviewed patient health questionnaire (PHQ-9) with patient for follow up. Previously, patients score indicated signs/symptoms of depression.  Reviewed to see if patient is improving symptom wise while in program.  Score improved and patient states that it is because he is taking care of his health more and seeing a therapist. Patient states that after his diagnosis with stage 4 lung cancer  he dealt with a lot of depression. He states that his continuous exercise has helped him mentally, he also has a therapist that he sees once a week that helps him talk about his situation.    Expected Outcomes Short: Continue to attend [facility] regularly for regular exercise and social engagement. Long: Continue to improve symptoms and manage a positive mental state.    Interventions Encouraged to attend Pulmonary Rehabilitation for the exercise    Continue Psychosocial Services  Follow up required by staff             Education: Education Goals: Education classes will be provided on a weekly basis, covering required topics. Participant will state understanding/return demonstration of topics presented.  Learning Barriers/Preferences:  Learning Barriers/Preferences - 11/08/22 0939       Learning Barriers/Preferences   Learning Barriers None    Learning Preferences None             General Pulmonary Education Topics:  Infection Prevention: - Provides verbal and written material to individual with discussion of infection control including proper hand washing and proper equipment cleaning during exercise session. Flowsheet Row Pulmonary Rehab from 01/06/2023 in [facility] Cardiac and Pulmonary Rehab  Date 11/11/22  Educator NT  Instruction Review Code 1- Verbalizes Understanding       Falls Prevention: - Provides verbal and written material to individual with discussion of falls prevention and safety. Flowsheet Row Pulmonary Rehab from 01/06/2023 in [facility] Cardiac and Pulmonary Rehab  Date 11/11/22  Educator NT  Instruction Review Code 1- Verbalizes Understanding       Chronic Lung Disease Review: - Group verbal instruction with posters, models, PowerPoint presentations and videos,  to review new updates, new respiratory medications, new advancements in procedures and treatments. Providing information on websites and "800" numbers for continued self-education. Includes  information about supplement oxygen, available portable oxygen systems, continuous and intermittent flow rates, oxygen safety, concentrators, and Medicare reimbursement for oxygen. Explanation of Pulmonary Drugs, including class, frequency, complications, importance of spacers, rinsing mouth after steroid MDI's, and proper cleaning methods for nebulizers. Review of basic lung anatomy and physiology related to function, structure, and complications of lung disease. Review of risk factors. Discussion about methods for diagnosing sleep apnea and types of masks and machines for OSA. Includes a review of the use of types of environmental controls: home humidity, furnaces, filters, dust mite/pet prevention, HEPA vacuums. Discussion about weather changes, air quality and the benefits of nasal washing. Instruction on Warning signs, infection symptoms, calling MD promptly, preventive modes, and value of vaccinations. Review of effective airway clearance, coughing and/or vibration techniques.  Emphasizing that all should Create an Action Plan. Written material given at graduation. Flowsheet Row Pulmonary Rehab from 01/06/2023 in [facility] Cardiac and Pulmonary Rehab  Education need identified 11/11/22  Date 01/06/23  Educator [facility]  Instruction Review Code 1- Verbalizes Understanding       AED/CPR: - Group verbal and written instruction with the use of models to demonstrate the basic use of the AED with the basic ABC's of resuscitation.    Anatomy and Cardiac Procedures: - Group verbal and visual presentation and models provide information about basic cardiac anatomy and function. Reviews the testing methods done to diagnose heart disease and the outcomes of the test results. Describes the treatment choices: Medical Management, Angioplasty, or Coronary Bypass Surgery for treating various heart conditions including Myocardial Infarction, Angina, Valve Disease, and Cardiac Arrhythmias.  Written material given at  graduation. Flowsheet Row Pulmonary Rehab from 01/06/2023 in [facility] Cardiac and Pulmonary Rehab  Date 12/16/22  Educator SB  Instruction Review Code 1- Verbalizes Understanding       Medication Safety: - Group verbal and visual instruction to review commonly prescribed medications for heart and lung disease. Reviews the medication, class of the drug, and side effects. Includes the steps to properly store meds and maintain the prescription regimen.  Written material given at graduation. Flowsheet Row Pulmonary Rehab from 01/06/2023 in [facility] Cardiac and Pulmonary Rehab  Date 12/21/22  Educator [facility]  Instruction Review Code 1- Verbalizes Understanding       Other: -Provides group and verbal instruction on various topics (see comments)   Knowledge Questionnaire Score:  Knowledge Questionnaire Score - 11/11/22 1324       Knowledge Questionnaire Score   Pre Score 15/18              Core Components/Risk Factors/Patient Goals at Admission:  Personal Goals and Risk Factors at Admission - 11/08/22 0939       Core Components/Risk Factors/Patient Goals on Admission    Weight Management Yes;Weight Maintenance    Intervention Weight Management: Develop a combined nutrition and exercise program designed to reach desired caloric intake, while maintaining appropriate intake of nutrient and fiber, sodium and fats, and appropriate energy expenditure required for the weight goal.;Weight Management: Provide education and appropriate resources to help participant work on and attain dietary goals.;Weight Management/Obesity: Establish reasonable short term and long term weight goals.    Expected Outcomes Short Term: Continue to assess and modify interventions until short term weight is achieved;Long Term: Adherence to nutrition and physical activity/exercise program aimed toward attainment of established weight goal;Weight Maintenance: Understanding of the daily nutrition guidelines, which includes  25-35% calories from fat, 7% or less cal from saturated fats, less than 200mg  cholesterol, less than 1.5gm of sodium, & 5 or more servings of fruits and vegetables daily;Understanding recommendations for meals to include 15-35% energy as protein, 25-35% energy from fat, 35-60% energy from carbohydrates, less than 200mg  of dietary cholesterol, 20-35 gm of total fiber daily;Understanding of distribution of calorie intake throughout the day with the consumption of 4-5 meals/snacks    Improve shortness of breath with ADL's Yes    Intervention Provide education, individualized exercise plan and daily activity instruction to help decrease symptoms of SOB with activities of daily living.    Expected Outcomes Short Term: Improve cardiorespiratory fitness to achieve a reduction of symptoms when performing ADLs;Long Term: Be able to perform more ADLs without symptoms or delay the onset of symptoms    Hypertension Yes    Intervention Provide education  on lifestyle modifcations including regular physical activity/exercise, weight management, moderate sodium restriction and increased consumption of fresh fruit, vegetables, and low fat dairy, alcohol moderation, and smoking cessation.;Monitor prescription use compliance.    Expected Outcomes Short Term: Continued assessment and intervention until BP is < 140/50mm HG in hypertensive participants. < 130/64mm HG in hypertensive participants with diabetes, heart failure or chronic kidney disease.;Long Term: Maintenance of blood pressure at goal levels.    Lipids Yes    Intervention Provide education and support for participant on nutrition & aerobic/resistive exercise along with prescribed medications to achieve LDL 70mg , HDL >40mg .    Expected Outcomes Short Term: Participant states understanding of desired cholesterol values and is compliant with medications prescribed. Participant is following exercise prescription and nutrition guidelines.;Long Term: Cholesterol  controlled with medications as prescribed, with individualized exercise RX and with personalized nutrition plan. Value goals: LDL < 70mg , HDL > 40 mg.             Education:Diabetes - Individual verbal and written instruction to review signs/symptoms of diabetes, desired ranges of glucose level fasting, after meals and with exercise. Acknowledge that pre and post exercise glucose checks will be done for 3 sessions at entry of program.   Know Your Numbers and Heart Failure: - Group verbal and visual instruction to discuss disease risk factors for cardiac and pulmonary disease and treatment options.  Reviews associated critical values for Overweight/Obesity, Hypertension, Cholesterol, and Diabetes.  Discusses basics of heart failure: signs/symptoms and treatments.  Introduces Heart Failure Zone chart for action plan for heart failure.  Written material given at graduation. Flowsheet Row Pulmonary Rehab from 01/06/2023 in [facility] Cardiac and Pulmonary Rehab  Date 12/30/22  Educator SB  Instruction Review Code 1- Verbalizes Understanding       Core Components/Risk Factors/Patient Goals Review:   Goals and Risk Factor Review     Row Name 12/02/22 0744 01/04/23 0809           Core Components/Risk Factors/Patient Goals Review   Personal Goals Review Improve shortness of breath with ADL's Improve shortness of breath with ADL's      Review Spoke to patient about their shortness of breath and what they can do to improve. Patient has been informed of breathing techniques when starting the program. Patient is informed to tell staff if they have had any med changes and that certain meds they are taking or not taking can be causing shortness of breath. Patient states that he has seen major improvements with his shortness of breath, due to his exercise at home as well as in the program. Patient has been informed of breathing techniques when starting the program. Patient is informed to tell staff if  they have had any med changes and that certain meds they are taking or not taking can be causing shortness of breath.      Expected Outcomes Short: Attend [facility] regularly to improve shortness of breath with ADL's. Long: maintain independence with ADL's Short: Attend [facility] regularly to improve shortness of breath with ADL's. Long: maintain independence with ADL's               Core Components/Risk Factors/Patient Goals at Discharge (Final Review):   Goals and Risk Factor Review - 01/04/23 0809       Core Components/Risk Factors/Patient Goals Review   Personal Goals Review Improve shortness of breath with ADL's    Review Patient states that he has seen major improvements with his shortness of breath, due to his  exercise at home as well as in the program. Patient has been informed of breathing techniques when starting the program. Patient is informed to tell staff if they have had any med changes and that certain meds they are taking or not taking can be causing shortness of breath.    Expected Outcomes Short: Attend [facility] regularly to improve shortness of breath with ADL's. Long: maintain independence with ADL's             ITP Comments:  ITP Comments     Row Name 11/08/22 1610 11/11/22 1322 11/16/22 0831 11/24/22 1244 12/15/22 1140   ITP Comments Virtual Visit completed. Patient informed on EP and RD appointment and 6 Minute walk test. Patient also informed of patient health questionnaires on My Chart. Patient Verbalizes understanding. Visit diagnosis can be found in [facility] 10/14/2022. Completed and gym orientation. Initial ITP created and sent for review to Dr. Jinny Sanders, Medical Director. First full day of exercise!  Patient was oriented to gym and equipment including functions, settings, policies, and procedures.  Patient's individual exercise prescription and treatment plan were reviewed.  All starting workloads were established based on the results of the 6 minute  walk test done at initial orientation visit.  The plan for exercise progression was also introduced and progression will be customized based on patient's performance and goals. 30 Day review completed. Medical Director ITP review done, changes made as directed, and signed approval by Medical Director.    new to prgram 30 Day review completed. Medical Director ITP review done, changes made as directed, and signed approval by Medical Director.    Row Name 01/12/23 1000           ITP Comments 30 Day review completed. Medical Director ITP review done, changes made as directed, and signed approval by Medical Director.                Comments:

## 2023-01-13 ENCOUNTER — Other Ambulatory Visit: Payer: Self-pay | Admitting: Family Medicine

## 2023-01-13 ENCOUNTER — Encounter: Payer: Medicaid Other | Admitting: *Deleted

## 2023-01-13 ENCOUNTER — Ambulatory Visit: Payer: 59 | Admitting: Pulmonary Disease

## 2023-01-13 ENCOUNTER — Other Ambulatory Visit: Payer: Self-pay

## 2023-01-13 DIAGNOSIS — J449 Chronic obstructive pulmonary disease, unspecified: Secondary | ICD-10-CM

## 2023-01-13 DIAGNOSIS — J441 Chronic obstructive pulmonary disease with (acute) exacerbation: Secondary | ICD-10-CM | POA: Diagnosis not present

## 2023-01-13 DIAGNOSIS — R202 Paresthesia of skin: Secondary | ICD-10-CM

## 2023-01-13 DIAGNOSIS — G8929 Other chronic pain: Secondary | ICD-10-CM

## 2023-01-13 DIAGNOSIS — I10 Essential (primary) hypertension: Secondary | ICD-10-CM

## 2023-01-13 MED FILL — Montelukast Sodium Tab 10 MG (Base Equiv): ORAL | 30 days supply | Qty: 30 | Fill #0 | Status: AC

## 2023-01-13 MED FILL — Gabapentin Cap 300 MG: ORAL | 30 days supply | Qty: 120 | Fill #0 | Status: CN

## 2023-01-13 MED FILL — Valsartan Tab 40 MG: ORAL | 30 days supply | Qty: 30 | Fill #0 | Status: AC

## 2023-01-13 NOTE — Telephone Encounter (Signed)
Requested Prescriptions  Pending Prescriptions Disp Refills   montelukast (SINGULAIR) 10 MG tablet 30 tablet 0    Sig: Take 1 tablet (10 mg total) by mouth at bedtime.     Pulmonology:  Leukotriene Inhibitors Passed - 01/13/2023  3:21 PM      Passed - Valid encounter within last 12 months    Recent Outpatient Visits           1 month ago Primary hypertension   [place] [facility] [place], Monico Blitz, DO   2 months ago Stage 4 very severe COPD by GOLD classification [facility])   [place] [facility] [place], Monico Blitz, DO   2 months ago Chest pain with high risk of acute coronary syndrome   [facility] [place], Monico Blitz, DO   4 months ago Restrictive lung disease   [place] [facility] [place], Monico Blitz, DO   4 months ago Primary hypertension   [place] [facility] [place], Monico Blitz, DO       Future Appointments             In 2 weeks [place], Monico Blitz, DO [place] [facility]             valsartan (DIOVAN) 40 MG tablet 30 tablet 0    Sig: Take 1 tablet (40 mg total) by mouth daily. Start taking after one week, if top BP number is >125     Cardiovascular:  Angiotensin Receptor Blockers Failed - 01/13/2023  3:21 PM      Failed - Cr in normal range and within 180 days    Creatinine, Ser  Date Value Ref Range Status  10/25/2022 1.29 (H) 0.61 - 1.24 mg/dL Final         Failed - Last BP in normal range    BP Readings from Last 1 Encounters:  12/03/22 (!) 142/89         Passed - K in normal range and within 180 days    Potassium  Date Value Ref Range Status  10/25/2022 3.7 3.5 - 5.1 mmol/L Final         Passed - Patient is not pregnant      Passed - Valid encounter within last 6 months    Recent Outpatient Visits           1 month ago Primary hypertension   [place] [facility] [place], Monico Blitz, DO   2 months ago Stage 4 very severe COPD by  GOLD classification [facility])   [place] [facility] [place], Monico Blitz, DO   2 months ago Chest pain with high risk of acute coronary syndrome   [place] [facility] [place], Monico Blitz, DO   4 months ago Restrictive lung disease   [facility] [place], Monico Blitz, DO   4 months ago Primary hypertension   [place] [facility] [place], Monico Blitz, DO       Future Appointments             In 2 weeks [place], Monico Blitz, DO [place] [facility], [facility]             gabapentin (NEURONTIN) 300 MG capsule 120 capsule 0    Sig: Take 1 capsule (300 mg total) by mouth in the morning     Neurology: Anticonvulsants - gabapentin Failed - 01/13/2023  3:21 PM  Failed - Cr in normal range and within 360 days    Creatinine, Ser  Date Value Ref Range Status  10/25/2022 1.29 (H) 0.61 - 1.24 mg/dL Final         Passed - Completed PHQ-2 or PHQ-9 in the last 360 days      Passed - Valid encounter within last 12 months    Recent Outpatient Visits           1 month ago Primary hypertension   [place] [facility] [place], Monico Blitz, DO   2 months ago Stage 4 very severe COPD by GOLD classification [facility])   [place] [facility] [place], Monico Blitz, DO   2 months ago Chest pain with high risk of acute coronary syndrome   [facility] [place], Monico Blitz, DO   4 months ago Restrictive lung disease   [facility] [place], Monico Blitz, DO   4 months ago Primary hypertension   [place] [facility] [place], Monico Blitz, DO       Future Appointments             In 2 weeks [place], Monico Blitz, DO  [facility]

## 2023-01-13 NOTE — Progress Notes (Signed)
Daily Session Note  Patient Details  Name: Larry Maxwell MRN: [id number] Date of Birth: [birth year] Referring Provider:   Flowsheet Row Pulmonary Rehab from 11/11/2022 in [facility] Cardiac and Pulmonary Rehab  Referring Provider Janann Colonel       Encounter Date: 01/13/2023  Check In:  Session Check In - 01/13/23 0735       Check-In   Supervising physician immediately available to respond to emergencies See telemetry face sheet for immediately available ER MD    Location [facility]-Cardiac & Pulmonary Rehab    Staff Present Ronette Deter, BS, Exercise Physiologist;Joseph [place], RCP,RRT,BSRT;Maxon Ettrick BS, , Exercise Physiologist;Branson Kranz Katrinka Blazing, RN, [place]    Virtual Visit No    Medication changes reported     No    Fall or balance concerns reported    No    Warm-up and Cool-down Performed on first and last piece of equipment    Resistance Training Performed Yes    VAD Patient? No    PAD/SET Patient? No      Pain Assessment   Currently in Pain? No/denies                Social History   Tobacco Use  Smoking Status Former   Current packs/day: 0.00   Average packs/day: 1.5 packs/day for 20.0 years (30.0 ttl pk-yrs)   Types: Cigarettes   Start date: 12/19/2001   Quit date: 12/19/2021   Years since quitting: 1.0  Smokeless Tobacco Never    Goals Met:  Independence with exercise equipment Exercise tolerated well No report of concerns or symptoms today Strength training completed today  Goals Unmet:  Not Applicable  Comments: Pt able to follow exercise prescription today without complaint.  Will continue to monitor for progression.    Dr. Bethann Punches is Medical Director for [facility] Cardiac Rehabilitation.  Dr. Vida Rigger is Medical Director for [facility] Pulmonary Rehabilitation.

## 2023-01-14 ENCOUNTER — Other Ambulatory Visit: Payer: Self-pay

## 2023-01-14 NOTE — Patient Instructions (Signed)
Visit Information  Mr. Galante was given information about Medicaid Managed Care team care coordination services as a part of their [place] benefit. [name] verbally consented to engagement with the [facility] Managed Care team.   If you are experiencing a medical emergency, please call 911 or report to your local emergency department or urgent care.   If you have a non-emergency medical problem during routine business hours, please contact your provider's office and ask to speak with a nurse.   For questions related to your [place] health plan, please call: [phone]  If you would like to schedule transportation through your [place] plan, please call the following number at least 2 days in advance of your appointment: [phone].   There is no limit to the number of trips during the year between medical appointments, healthcare facilities, or pharmacies. Transportation must be scheduled at least 2 business days before but not more than thirty 30 days before of your appointment.  Call the Behavioral Health Crisis Line at [phone], at any time, 24 hours a day, 7 days a week. If you are in danger or need immediate medical attention call 911.  If you would like help to quit smoking, call 1-800-QUIT-NOW (443 811 9263) OR Espaol: 1-855-Djelo-Ya (4-132-440-1027) o para ms informacin haga clic aqu or Text READY to 253-664 to register via text  Mr. Luisi - following are the goals we discussed in your visit today:    Social Worker will follow up in 30 days.   Abelino Derrick, MHA [facility]  Managed [facility] Social Worker [phone]

## 2023-01-14 NOTE — Patient Outreach (Signed)
Medicaid Managed Care Social Work Note  01/14/2023 Name:  Larry Maxwell MRN:  [id number] DOB:  July 19, 1965  Larry Maxwell is an 57 y.o. year old male who is a primary patient of Sherlyn Hay, DO.  The [facility] Managed Care Coordination team was consulted for assistance with:   housing, dental , and vision resources  Mr. Larry Maxwell was given information about Medicaid Managed Care Coordination team services today. Larry Maxwell Patient agreed to services and verbal consent obtained.  Engaged with patient  for by telephone forfollow up visit in response to referral for case management and/or care coordination services.   Patient is participating in a Managed Medicaid Plan:  Yes  Assessments/Interventions:  Review of past medical history, allergies, medications, health status, including review of consultants reports, laboratory and other test data, was performed as part of comprehensive evaluation and provision of chronic care management services.  SDOH: (Social Drivers of Health) assessments and interventions performed: SDOH Interventions    Flowsheet Row Patient Outreach Telephone from 01/12/2023 in [place] POPULATION HEALTH DEPARTMENT Patient Outreach Telephone from 01/11/2023 in [place] POPULATION HEALTH DEPARTMENT Pulmonary Rehab from 01/04/2023 in [facility] Cardiac and Pulmonary Rehab Patient Outreach Telephone from 12/09/2022 in [place] POPULATION HEALTH DEPARTMENT Pulmonary Rehab from 12/02/2022 in [facility] Cardiac and Pulmonary Rehab Patient Outreach Telephone from 11/26/2022 in [place] POPULATION HEALTH DEPARTMENT  SDOH Interventions        Transportation Interventions -- -- -- Payor Benefit -- --  Utilities Interventions -- Intervention Not Indicated -- -- -- --  Alcohol Usage Interventions Intervention Not Indicated (Score <7) -- -- -- -- --  Depression Interventions/Treatment  -- -- Counseling -- Counseling Referral to Psychiatry, Medication, Counseling  Stress  Interventions -- -- -- -- -- Offered [employer], Provide Counseling  Social Connections Interventions Intervention Not Indicated -- -- -- -- --  Health Literacy Interventions -- Intervention Not Indicated -- -- -- --      BSW completed a telephone outreach with patient, he states he did receive the resources BSW sent him, but he already tried those. He states he still has not received anything from disability yet. Patient states he would like dental and vision resources for [employer]. BSW will email them.  Advanced Directives Status:  Not addressed in this encounter.  Care Plan                 Allergies  Allergen Reactions   Lisinopril Swelling    Angioedema and significant swelling in ankles as well    Medications Reviewed Today   Medications were not reviewed in this encounter     Patient Active Problem List   Diagnosis Date Noted   Insomnia due to medical condition 12/15/2022   Mixed restrictive and obstructive lung disease (HCC) 10/25/2022   Asthma-COPD overlap syndrome (HCC) 10/25/2022   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) 10/25/2022   Restrictive lung disease 08/25/2022   Paresthesia of right upper extremity 08/17/2022   History of diverticulosis 08/09/2022   Nicotine dependence with current use 08/04/2022   Prediabetes 08/02/2022   Hypertension 07/21/2022   Asthma, allergic, mild persistent, uncomplicated 07/21/2022   Depression, major, single episode, moderate (HCC) 07/21/2022   Mixed hyperlipidemia 07/21/2022   Chronic midline low back pain with left-sided sciatica 07/21/2022    Conditions to be addressed/monitored per PCP order:   dental and vision resources  There are no care plans that you recently modified to display for this patient.  Follow up:  Patient agrees to Care Plan and Follow-up.  Plan: The Managed Medicaid care management team will reach out to the patient again over the next 30 days.  Date/time of  next scheduled Social Work care management/care coordination outreach:  02/15/23  Larry Maxwell, Larry Maxwell, [facility]  Managed [facility] Social Worker [phone]

## 2023-01-17 ENCOUNTER — Other Ambulatory Visit: Payer: Self-pay | Admitting: Licensed Clinical Social Worker

## 2023-01-17 ENCOUNTER — Encounter: Payer: Medicaid Other | Admitting: *Deleted

## 2023-01-17 NOTE — Patient Outreach (Signed)
Medicaid Managed Care Social Work Note  01/17/2023 Name:  Larry Maxwell MRN:  [id number] DOB:  1965/06/30  Larry Maxwell is an 57 y.o. year old male who is a primary patient of Sherlyn Hay, DO.  The Medicaid Managed Care Coordination team was consulted for assistance with:  Mental Health Counseling and Resources  Larry Maxwell was given information about Medicaid Managed Care Coordination team services today. Larry Maxwell Patient agreed to services and verbal consent obtained.  Engaged with patient  for by telephone forfollow up visit in response to referral for case management and/or care coordination services.   Patient is participating in a Managed Medicaid Plan:  Yes  Assessments/Interventions:  Review of past medical history, allergies, medications, health status, including review of consultants reports, laboratory and other test data, was performed as part of comprehensive evaluation and provision of chronic care management services.  SDOH: (Social Drivers of Health) assessments and interventions performed: SDOH Interventions    Flowsheet Row Patient Outreach Telephone from 01/12/2023 in [place] POPULATION HEALTH DEPARTMENT Patient Outreach Telephone from 01/11/2023 in [place] POPULATION HEALTH DEPARTMENT Pulmonary Rehab from 01/04/2023 in [facility] Cardiac and Pulmonary Rehab Patient Outreach Telephone from 12/09/2022 in  POPULATION HEALTH DEPARTMENT Pulmonary Rehab from 12/02/2022 in [facility] Cardiac and Pulmonary Rehab Patient Outreach Telephone from 11/26/2022 in  POPULATION HEALTH DEPARTMENT  SDOH Interventions        Transportation Interventions -- -- -- Payor Benefit -- --  Utilities Interventions -- Intervention Not Indicated -- -- -- --  Alcohol Usage Interventions Intervention Not Indicated (Score <7) -- -- -- -- --  Depression Interventions/Treatment  -- -- Counseling -- Counseling Referral to Psychiatry, Medication, Counseling  Stress  Interventions -- -- -- -- -- Offered [employer], Provide Counseling  Social Connections Interventions Intervention Not Indicated -- -- -- -- --  Health Literacy Interventions -- Intervention Not Indicated -- -- -- --       Advanced Directives Status:  See Care Plan for related entries.  Care Plan                 Allergies  Allergen Reactions   Lisinopril Swelling    Angioedema and significant swelling in ankles as well    Medications Reviewed Today   Medications were not reviewed in this encounter     Patient Active Problem List   Diagnosis Date Noted   Insomnia due to medical condition 12/15/2022   Mixed restrictive and obstructive lung disease (HCC) 10/25/2022   Asthma-COPD overlap syndrome (HCC) 10/25/2022   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) 10/25/2022   Restrictive lung disease 08/25/2022   Paresthesia of right upper extremity 08/17/2022   History of diverticulosis 08/09/2022   Nicotine dependence with current use 08/04/2022   Prediabetes 08/02/2022   Hypertension 07/21/2022   Asthma, allergic, mild persistent, uncomplicated 07/21/2022   Depression, major, single episode, moderate (HCC) 07/21/2022   Mixed hyperlipidemia 07/21/2022   Chronic midline low back pain with left-sided sciatica 07/21/2022    Conditions to be addressed/monitored per PCP order:  Depression  Care Plan : LCSW Plan of Care  Updates made by Gustavus Bryant, LCSW since 01/17/2023 12:00 AM     Problem: Depression Identification (Depression)      Goal: Depressive Symptoms Identified   Start Date: 11/26/2022  Note:   Timeframe:  Short-Range Goal Priority:  High Start Date:  11/26/22            Expected  End Date: ongoing         Follow Up Date- 02/11/23 at 2 pm  Current Barriers:  Acute Mental Health needs related to stress management and depression symptoms Knowledge Deficits related to health benefits and local community resources Financial  constraints  Desire to relocate with limited housing options Need to initiate psychiatry and therapy services.   Clinical Social Work Goal(s):  Over the next 30 days, patient will work with SW monthly by telephone to reduce or manage symptoms related to stress management.  Over the next 30 days, patient will work with SW to address concerns related to finding a therapist and psychiatrist.   Interventions: Patient interviewed and appropriate assessments performed Provided mental health counseling with regard to stress management  Discussed plans with patient for ongoing care management follow up and provided patient with direct contact information for care management team Emotional/Supportive Counseling provided  Education on mental health service enrollment process Education on the difference between therapy, psychiatry and psychology services Patient reports his main focus right now is managing his financial responsibilities, gaining mental health services and gaining housing resource education.  Email sent to patient with therapy and psychology/psychiatry options in his area Pt is currently in the process of applying for disability-working with an attorney to assist. He resides with sister who assists with meals and daily care when needed. Patient now has been successfully connected with medicaid transportation [place] [phone]. Patient reports that service is effectively working for him. Patient now has stable transportation.  Pt confirmed symptoms od depression related to his medical condition and recent break-up, encouraged verbalization of feeling and use of postive coping strategies-self care emphasized-pt agrreable to ongoing mental health  Patient denies thoughts of harm to self or others-encouraged patient to call [phone] in the event of a mental health crisis 12/09/22- Patient has been successfully approved for food stamps. Pt reports that he has been successfully scheduled with both  initial sessions for psychiatry and counseling. He is looking forward to pursuing behavioral health treatment. Per PCP and patient, he is having issues affording and managing his medications and a referral for pharmacy care management was successfully placed as well as a referral for disease management for his COPD. Patient was provided education on healthy self-care tools to implement into his daily schedule. 01/17/23- Patient confirmed stable transportation arrangements for his upcoming psychiatry appointment on 01/25/23. He reports that he is looking forward to the holidays and is hopeful with the implementation of behavioral health providers and services, his overall mood will improve. [facility] LCSW provided healthy self-care education to patient to help improve his overall health and mood management.   Patient Self Care Activities:  Self administers medications as prescribed Attends all scheduled provider appointments Attends social activities Performs ADL's independently Performs IADL's independently Ability for insight Independent living Motivation for treatment  Patient Coping Strengths:  Hopefulness Self Advocate Able to Communicate Effectively  Patient Self Care Deficits:  Lacks social connections Family is not supportive.  Activities and task to complete in order to accomplish goals.   Please continue to follow up with [facility] to arrange transportation to medical appointments [phone]  EMOTIONAL / MENTAL HEALTH SUPPORT RESOURCE EDUCATION PROIVDED- Review email that was sent today on 11/26/22 Please anticipate call from [facility]  - journal feelings and what helps to feel better or worse - watch for early signs of feeling worse - write in journal every day    Why is this important?   Keeping track of your progress  will help your treatment team find the right mix of medicine and therapy for you.  Write in your journal every day.  Day-to-day changes in depression symptoms are  normal. It may be more helpful to check your progress at the end of each week instead of every day.       Follow up:  Patient agrees to Care Plan and Follow-up.  Plan: The Managed Medicaid care management team will reach out to the patient again over the next 30 days.  Dickie La, BSW, MSW, LCSW Licensed Clinical Social Worker [employer]   [email or web address] Direct Dial: [phone]

## 2023-01-17 NOTE — Patient Instructions (Signed)
Visit Information  Larry Maxwell was given information about Medicaid Managed Care team care coordination services as a part of their [place] benefit. Larry Maxwell verbally consented to engagement with the [facility] Managed Care team.   If you are experiencing a medical emergency, please call 911 or report to your local emergency department or urgent care.   If you have a non-emergency medical problem during routine business hours, please contact your provider's office and ask to speak with a nurse.   For questions related to your [place] health plan, please call: [phone]  If you would like to schedule transportation through your [place] plan, please call the following number at least 2 days in advance of your appointment: [phone].   There is no limit to the number of trips during the year between medical appointments, healthcare facilities, or pharmacies. Transportation must be scheduled at least 2 business days before but not more than thirty 30 days before of your appointment.  Call the Behavioral Health Crisis Line at [phone], at any time, 24 hours a day, 7 days a week. If you are in danger or need immediate medical attention call 911.  If you would like help to quit smoking, call 1-800-QUIT-NOW (225-137-3434) OR Espaol: 1-855-Djelo-Ya (4-132-440-1027) o para ms informacin haga clic aqu or Text READY to 253-664 to register via text  Following is a copy of your plan of care:  Care Plan : LCSW Plan of Care  Updates made by Larry Bryant, LCSW since 01/17/2023 12:00 AM     Problem: Depression Identification (Depression)      Goal: Depressive Symptoms Identified   Start Date: 11/26/2022  Note:   Timeframe:  Short-Range Goal Priority:  High Start Date:  11/26/22            Expected End Date: ongoing         Follow Up Date- 02/11/23 at 2 pm  Current Barriers:  Acute Mental Health needs related to stress  management and depression symptoms Knowledge Deficits related to health benefits and local community resources Financial constraints  Desire to relocate with limited housing options Need to initiate psychiatry and therapy services.   Clinical Social Work Goal(s):  Over the next 30 days, patient will work with SW monthly by telephone to reduce or manage symptoms related to stress management.  Over the next 30 days, patient will work with SW to address concerns related to finding a therapist and psychiatrist.   Patient Self Care Activities:  Self administers medications as prescribed Attends all scheduled provider appointments Attends social activities Performs ADL's independently Performs IADL's independently Ability for insight Independent living Motivation for treatment  Patient Coping Strengths:  Hopefulness Self Advocate Able to Communicate Effectively  Patient Self Care Deficits:  Lacks social connections Family is not supportive.  Activities and task to complete in order to accomplish goals.   Please continue to follow up with Larry Maxwell to arrange transportation to medical appointments [phone]  EMOTIONAL / MENTAL HEALTH SUPPORT RESOURCE EDUCATION PROIVDED- Review email that was sent today on 11/26/22  - journal feelings and what helps to feel better or worse - watch for early signs of feeling worse - write in journal every day    Why is this important?   Keeping track of your progress will help your treatment team find the right mix of medicine and therapy for you.  Write in your journal every day.  Day-to-day changes in depression symptoms are normal. It may  be more helpful to check your progress at the end of each week instead of every day.        24- Hour Availability:    [facility]  [address] [place], [place] [phone] Crisis [phone]   [facility] [phone]  [place]  Crisis Service  [phone]    [facility]  [phone] (after hours)   Therapeutic Alternative/Mobile Crisis   [phone]   [place] National Suicide Hotline  774-423-8434 Len Child[place]) [place] [address]   Call 8782638917 for mental health emergencies   [facility]  [phone]);  [place] and [employer]  [phone]); [place], [place], [place], [place], [place], [place], [place]    [place] [facility] for [facility] Residents For 24/7 walk-up access to mental health services for [facility] children (4+), adolescents and adults, please visit the [facility] located at [address] in [place], [place].  *[place] also provides comprehensive outpatient behavioral health services in a variety of locations around the [place].  Connect With [place] [address] [place], [place] [address] HelpLine: (954) [phone] or 1-(602)265-0928  Get Directions  Find Help 24/7 By Phone Call our 24-hour HelpLine at 872-496-8053 or (541)496-4518 for immediate assistance for mental health and substance abuse issues.  Walk-In Help [place]: [facility] (Ages 4 and Up) [place]: Emergency Dept., [facility] Additional Resources National Hopeline Network: 1-800-SUICIDE The National Suicide Prevention Lifeline: 1-800-273-TALK     The following coping skill education was provided for stress relief and mental health management: "When your car dies or a deadline looms, how do you respond? Long-term, low-grade or acute stress takes a serious toll on your body and mind, so don't ignore feelings of constant tension. Stress is a natural part of life. However, too much stress can harm our health, especially if it continues every day. This is chronic stress and can put you at risk for heart problems like heart disease and depression. Understand what's happening  inside your body and learn simple coping skills to combat the negative impacts of everyday stressors.  Types of Stress There are two types of stress: Emotional - types of emotional stress are relationship problems, pressure at work, financial worries, experiencing discrimination or having a major life change. Physical - Examples of physical stress include being sick having pain, not sleeping well, recovery from an injury or having an alcohol and drug use disorder. Fight or Flight Sudden or ongoing stress activates your nervous system and floods your bloodstream with adrenaline and cortisol, two hormones that raise blood pressure, increase heart rate and spike blood sugar. These changes pitch your body into a fight or flight response. That enabled our ancestors to outrun saber-toothed tigers, and it's helpful today for situations like dodging a car accident. But most modern chronic stressors, such as finances or a challenging relationship, keep your body in that heightened state, which hurts your health. Effects of Too Much Stress If constantly under stress, most of [place] will eventually start to function less well.  Multiple studies link chronic stress to a higher risk of heart disease, stroke, depression, weight gain, memory loss and even premature death, so it's important to recognize the warning signals. Talk to your doctor about ways to manage stress if you're experiencing any of these symptoms: Prolonged periods of poor sleep. Regular, severe headaches. Unexplained weight loss or gain. Feelings of isolation,  withdrawal or worthlessness. Constant anger and irritability. Loss of interest in activities. Constant worrying or obsessive thinking. Excessive alcohol or drug use. Inability to concentrate.  10 Ways to Cope with Chronic Stress It's key to recognize stressful situations as they occur because it allows you to focus on managing how you react. We all need to know when to close our eyes and  take a deep breath when we feel tension rising. Use these tips to prevent or reduce chronic stress. 1. Rebalance Work and Home All work and no play? If you're spending too much time at the office, intentionally put more dates in your calendar to enjoy time for fun, either alone or with others. 2. Get Regular Exercise Moving your body on a regular basis balances the nervous system and increases blood circulation, helping to flush out stress hormones. Even a daily 20-minute walk makes a difference. Any kind of exercise can lower stress and improve your mood ? just pick activities that you enjoy and make it a regular habit. 3. Eat Well and Limit Alcohol and Stimulants Alcohol, nicotine and caffeine may temporarily relieve stress but have negative health impacts and can make stress worse in the long run. Well-nourished bodies cope better, so start with a good breakfast, add more organic fruits and vegetables for a well-balanced diet, avoid processed foods and sugar, try herbal tea and drink more water. 4. Connect with Supportive People Talking face to face with another person releases hormones that reduce stress. Lean on those good listeners in your life. 5. Carve Out Hobby Time Do you enjoy gardening, reading, listening to music or some other creative pursuit? Engage in activities that bring you pleasure and joy; research shows that reduces stress by almost half and lowers your heart rate, too. 6. Practice Meditation, Stress Reduction or Yoga Relaxation techniques activate a state of restfulness that counterbalances your body's fight-or-flight hormones. Even if this also means a 10-minute break in a long day: listen to music, read, go for a walk in nature, do a hobby, take a bath or spend time with a friend. Also consider doing a mindfulness exercise or try a daily deep breathing or imagery practice. Deep Breathing Slow, calm and deep breathing can help you relax. Try these steps to focus on your  breathing and repeat as needed. Find a comfortable position and close your eyes. Exhale and drop your shoulders. Breathe in through your nose; fill your lungs and then your belly. Think of relaxing your body, quieting your mind and becoming calm and peaceful. Breathe out slowly through your nose, relaxing your belly. Think of releasing tension, pain, worries or distress. Repeat steps three and four until you feel relaxed. Imagery This involves using your mind to excite the senses -- sound, vision, smell, taste and feeling. This may help ease your stress. Begin by getting comfortable and then do some slow breathing. Imagine a place you love being at. It could be somewhere from your childhood, somewhere you vacationed or just a place in your imagination. Feel how it is to be in the place you're imagining. Pay attention to the sounds, air, colors, and who is there with you. This is a place where you feel cared for and loved. All is well. You are safe. Take in all the smells, sounds, tastes and feelings. As you do, feel your body being nourished and healed. Feel the calm that surrounds you. Breathe in all the good. Breathe out any discomfort or tension. 7. Sleep Enough If you  get less than seven to eight hours of sleep, your body won't tolerate stress as well as it could. If stress keeps you up at night, address the cause, and add extra meditation into your day to make up for the lost z's. Try to get seven to nine hours of sleep each night. Make a regular bedtime schedule. Keep your room dark and cool. Try to avoid computers, TV, cell phones and tablets before bed. 8. Bond with Connections You Enjoy Go out for a coffee with a friend, chat with a neighbor, call a family member, visit with a clergy member, or even hang out with your pet. Clinical studies show that spending even a short time with a companion animal can cut anxiety levels almost in half. 9. Take a Vacation Getting away from it all can  reset your stress tolerance by increasing your mental and emotional outlook, which makes you a happier, more productive person upon return. Leave your cellphone and laptop at home! 10. See a Counselor, Coach or Therapist If negative thoughts overwhelm your ability to make positive changes, it's time to seek professional help. Make an appointment today--your health and life are worth it."  Dickie La, BSW, MSW, LCSW Licensed Clinical Social Worker [employer]   [facility] [email or web address] Direct Dial: [phone]

## 2023-01-18 ENCOUNTER — Other Ambulatory Visit: Payer: Self-pay

## 2023-01-18 ENCOUNTER — Encounter: Payer: Medicaid Other | Admitting: *Deleted

## 2023-01-18 DIAGNOSIS — J449 Chronic obstructive pulmonary disease, unspecified: Secondary | ICD-10-CM

## 2023-01-18 DIAGNOSIS — J441 Chronic obstructive pulmonary disease with (acute) exacerbation: Secondary | ICD-10-CM | POA: Diagnosis not present

## 2023-01-18 MED FILL — Gabapentin Cap 300 MG: ORAL | 30 days supply | Qty: 120 | Fill #0 | Status: AC

## 2023-01-18 NOTE — Progress Notes (Signed)
Daily Session Note  Patient Details  Name: Larry Maxwell MRN: [id number] Date of Birth: [birth year] Referring Provider:   Flowsheet Row Pulmonary Rehab from 11/11/2022 in [facility] Cardiac and Pulmonary Re[facility]  Referring Provider Janann Colonel       Encounter Date: 01/18/2023  Check In:  Session Check In - 01/18/23 0746       Check-In   Supervising physician immediately available to respond to emergencies See telemetry face sheet for immediately available ER MD    Location [facility]-Cardiac & Pulmonary Rehab    Staff Present Cora Collum, RN, BSN, CCRP;Margaret Best, MS, Exercise Physiologist;Kristen Coble, RN,BC,MSN;Noah Tickle, BS, Exercise Physiologist    Virtual Visit No    Medication changes reported     No    Fall or balance concerns reported    No    Warm-up and Cool-down Performed on first and last piece of equipment    Resistance Training Performed Yes    VAD Patient? No      Pain Assessment   Currently in Pain? No/denies                Social History   Tobacco Use  Smoking Status Former   Current packs/day: 0.00   Average packs/day: 1.5 packs/day for 20.0 years (30.0 ttl pk-yrs)   Types: Cigarettes   Start date: 12/19/2001   Quit date: 12/19/2021   Years since quitting: 1.0  Smokeless Tobacco Never    Goals Met:  Proper associated with RPD/PD & O2 Sat Independence with exercise equipment Exercise tolerated well No report of concerns or symptoms today  Goals Unmet:  Not Applicable  Comments: Pt able to follow exercise prescription today without complaint.  Will continue to monitor for progression.    Dr. Bethann Punches is Medical Director for [facility] Cardiac Rehabilitation.  Dr. Vida Rigger is Medical Director for [facility] Pulmonary Rehabilitation.

## 2023-01-20 ENCOUNTER — Encounter: Payer: Medicaid Other | Admitting: *Deleted

## 2023-01-20 DIAGNOSIS — J449 Chronic obstructive pulmonary disease, unspecified: Secondary | ICD-10-CM

## 2023-01-20 DIAGNOSIS — J441 Chronic obstructive pulmonary disease with (acute) exacerbation: Secondary | ICD-10-CM | POA: Diagnosis not present
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
## 2023-01-20 NOTE — Progress Notes (Signed)
Daily Session Note  Patient Details  Name: Larry Maxwell MRN: [id number] Date of Birth: July 07, 1965 Referring Provider:   Flowsheet Row Pulmonary Rehab from 11/11/2022 in [facility] Cardiac and Pulmonary Rehab  Referring Provider Janann Colonel       Encounter Date: 01/20/2023  Check In:  Session Check In - 01/20/23 0732       Check-In   Supervising physician immediately available to respond to emergencies See telemetry face sheet for immediately available ER MD    Location [facility]-Cardiac & Pulmonary Rehab    Staff Present Ronette Deter, BS, Exercise Physiologist;Joseph Mattawan, [place];Cora Collum, RN, BSN, CCRP;Braedan Meuth Katrinka Blazing, RN, ADN    Virtual Visit No    Medication changes reported     No    Fall or balance concerns reported    No    Warm-up and Cool-down Performed on first and last piece of equipment    Resistance Training Performed Yes    VAD Patient? No    PAD/SET Patient? No      Pain Assessment   Currently in Pain? No/denies                Social History   Tobacco Use  Smoking Status Former   Current packs/day: 0.00   Average packs/day: 1.5 packs/day for 20.0 years (30.0 ttl pk-yrs)   Types: Cigarettes   Start date: 12/19/2001   Quit date: 12/19/2021   Years since quitting: 1.0  Smokeless Tobacco Never    Goals Met:  Independence with exercise equipment Exercise tolerated well No report of concerns or symptoms today Strength training completed today  Goals Unmet:  Not Applicable  Comments: Pt able to follow exercise prescription today without complaint.  Will continue to monitor for progression.    Dr. Bethann Punches is Medical Director for [facility] Cardiac Rehabilitation.  Dr. Vida Rigger is Medical Director for [facility] Pulmonary Rehabilitation.

## 2023-01-23 ENCOUNTER — Other Ambulatory Visit: Payer: Self-pay | Admitting: Family Medicine

## 2023-01-23 DIAGNOSIS — G8929 Other chronic pain: Secondary | ICD-10-CM

## 2023-01-24 ENCOUNTER — Other Ambulatory Visit: Payer: Self-pay | Admitting: Family Medicine

## 2023-01-24 ENCOUNTER — Encounter: Payer: Medicaid Other | Admitting: *Deleted

## 2023-01-24 ENCOUNTER — Other Ambulatory Visit: Payer: Self-pay

## 2023-01-24 DIAGNOSIS — G8929 Other chronic pain: Secondary | ICD-10-CM

## 2023-01-24 DIAGNOSIS — J449 Chronic obstructive pulmonary disease, unspecified: Secondary | ICD-10-CM

## 2023-01-24 DIAGNOSIS — J441 Chronic obstructive pulmonary disease with (acute) exacerbation: Secondary | ICD-10-CM | POA: Diagnosis not present

## 2023-01-24 NOTE — Progress Notes (Signed)
Daily Session Note  Patient Details  Name: Larry Maxwell MRN: [id number] Date of Birth: Feb 13, 1965 Referring Provider:   Flowsheet Row Pulmonary Rehab from 11/11/2022 in [facility] Cardiac and Pulmonary Rehab  Referring Provider Janann Colonel       Encounter Date: 01/24/2023  Check In:  Session Check In - 01/24/23 0740       Check-In   Supervising physician immediately available to respond to emergencies See telemetry face sheet for immediately available ER MD    Location [facility]-Cardiac & Pulmonary Rehab    Staff Present Ronette Deter, BS, Exercise Physiologist;Jason Wallace [place], [place], LDN;Joseph Shelbie Proctor, RN, [place]    Virtual Visit No    Medication changes reported     No    Fall or balance concerns reported    No    Warm-up and Cool-down Performed on first and last piece of equipment    Resistance Training Performed Yes    VAD Patient? No    PAD/SET Patient? No      Pain Assessment   Currently in Pain? No/denies                Social History   Tobacco Use  Smoking Status Former   Current packs/day: 0.00   Average packs/day: 1.5 packs/day for 20.0 years (30.0 ttl pk-yrs)   Types: Cigarettes   Start date: 12/19/2001   Quit date: 12/19/2021   Years since quitting: 1.0  Smokeless Tobacco Never    Goals Met:  Independence with exercise equipment Exercise tolerated well No report of concerns or symptoms today Strength training completed today  Goals Unmet:  Not Applicable  Comments: Pt able to follow exercise prescription today without complaint.  Will continue to monitor for progression.    Dr. Bethann Punches is Medical Director for [facility] Cardiac Rehabilitation.  Dr. Vida Rigger is Medical Director for [facility] Pulmonary Rehabilitation.

## 2023-01-25 ENCOUNTER — Other Ambulatory Visit: Payer: Self-pay

## 2023-01-25 ENCOUNTER — Encounter: Payer: Self-pay | Admitting: Psychiatry

## 2023-01-25 ENCOUNTER — Ambulatory Visit (INDEPENDENT_AMBULATORY_CARE_PROVIDER_SITE_OTHER): Payer: Medicaid Other | Admitting: Psychiatry

## 2023-01-25 VITALS — BP 124/86 | HR 86 | Temp 98.8°F | Ht 73.0 in | Wt 197.4 lb

## 2023-01-25 DIAGNOSIS — F331 Major depressive disorder, recurrent, moderate: Secondary | ICD-10-CM

## 2023-01-25 NOTE — Patient Instructions (Signed)
 Increase sertraline 100 mg at night  Decrease duloxetine 30 mg daily for one week, then discontinue  Next appointment: 2/13 at 3 PM

## 2023-01-27 ENCOUNTER — Ambulatory Visit: Payer: Medicaid Other | Admitting: Clinical

## 2023-01-27 ENCOUNTER — Encounter: Payer: Medicaid Other | Attending: Pulmonary Disease | Admitting: *Deleted

## 2023-01-27 ENCOUNTER — Other Ambulatory Visit: Payer: Self-pay

## 2023-01-27 DIAGNOSIS — Z125 Encounter for screening for malignant neoplasm of prostate: Secondary | ICD-10-CM | POA: Diagnosis not present

## 2023-01-27 DIAGNOSIS — J439 Emphysema, unspecified: Secondary | ICD-10-CM | POA: Diagnosis not present

## 2023-01-27 DIAGNOSIS — I10 Essential (primary) hypertension: Secondary | ICD-10-CM | POA: Diagnosis not present

## 2023-01-27 DIAGNOSIS — F323 Major depressive disorder, single episode, severe with psychotic features: Secondary | ICD-10-CM

## 2023-01-27 DIAGNOSIS — R739 Hyperglycemia, unspecified: Secondary | ICD-10-CM | POA: Insufficient documentation

## 2023-01-27 DIAGNOSIS — J4489 Other specified chronic obstructive pulmonary disease: Secondary | ICD-10-CM | POA: Diagnosis not present

## 2023-01-27 DIAGNOSIS — J449 Chronic obstructive pulmonary disease, unspecified: Secondary | ICD-10-CM

## 2023-01-27 MED FILL — Methocarbamol Tab 500 MG: ORAL | 5 days supply | Qty: 20 | Fill #0 | Status: CN

## 2023-01-27 NOTE — Progress Notes (Signed)
 Daily Session Note  Patient Details  Name: Larry Maxwell MRN: [id number] Date of Birth: [birth year] Referring Provider:   Flowsheet Row Pulmonary Rehab from 11/11/2022 in [facility] Cardiac and Pulmonary Rehab  Referring Provider Darrin Barn       Encounter Date: 01/27/2023  Check In:  Session Check In - 01/27/23 0749       Check-In   Supervising physician immediately available to respond to emergencies See telemetry face sheet for immediately available ER MD    Location [facility]-Cardiac & Pulmonary Rehab    Staff Present Othel Durand, RN, BSN, CCRP;Maxon Conetta BS, , Exercise Physiologist;Shunsuke Granzow Claudene, RN, ADN    Virtual Visit No    Medication changes reported     No    Fall or balance concerns reported    No    Warm-up and Cool-down Performed on first and last piece of equipment    Resistance Training Performed Yes    VAD Patient? No    PAD/SET Patient? No      Pain Assessment   Currently in Pain? No/denies                Social History   Tobacco Use  Smoking Status Former   Current packs/day: 0.00   Average packs/day: 1.5 packs/day for 20.0 years (30.0 ttl pk-yrs)   Types: Cigarettes   Start date: 12/19/2001   Quit date: 12/19/2021   Years since quitting: 1.1  Smokeless Tobacco Never    Goals Met:  Independence with exercise equipment Exercise tolerated well No report of concerns or symptoms today Strength training completed today  Goals Unmet:  Not Applicable  Comments: Pt able to follow exercise prescription today without complaint.  Will continue to monitor for progression.    Dr. Oneil Pinal is Medical Director for [facility] Cardiac Rehabilitation.  Dr. Fuad Aleskerov is Medical Director for [facility] Pulmonary Rehabilitation.
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
## 2023-01-27 NOTE — Progress Notes (Signed)
   Larry Barthel, LCSW

## 2023-01-30 ENCOUNTER — Other Ambulatory Visit: Payer: Self-pay

## 2023-01-31 ENCOUNTER — Encounter: Payer: Medicaid Other | Admitting: *Deleted

## 2023-01-31 DIAGNOSIS — J4489 Other specified chronic obstructive pulmonary disease: Secondary | ICD-10-CM | POA: Diagnosis not present

## 2023-01-31 DIAGNOSIS — J449 Chronic obstructive pulmonary disease, unspecified: Secondary | ICD-10-CM

## 2023-01-31 NOTE — Progress Notes (Signed)
 Assessment start time: 8:35 AM   24-hours Recall: B: oatmeal L: 2 boiled hot dogs D: baked chicken, brown rice, broccoli   Beverages 64-90oz water  Education r/t nutrition plan Patient drinking adequate water, cut out juice and soda, now drinking more plain water. He reports he has done well with eating smaller meals with protein and carb paired together creating more consistency and structure in his eating routine. Says he has stopped eating fried chicken with so much oil and is now using oven fried or air fried to cut back on excessive oil intake. He is not salting his food at the table or when cooking. Reminded him that less than 1500mg  sodium is his daily target. Encouraged him to read labels of foods he eats regularly to ensure processed or salty foods don't make him overshoot the 1500mg  sodium goal often. He still reports struggling with overeating at some meals. Spoke with him about ways to try and buffer hunger before meals to practice more mindful eating. Encouraged drinking water before larger meals or including more veggies at meals or fresh fruit before eating.     Goal 1: Continue to eat smaller meals throughout the day Goal 2: Read labels and reduce sodium intake to below 2300mg . Ideally 1500mg  per day.  Goal 3: Include more colorful produce, aim for 5-8 servings of fruits and veggies per day  End time 8:51 AM

## 2023-01-31 NOTE — Progress Notes (Signed)
 Daily Session Note  Patient Details  Name: Larry Maxwell MRN: [id number] Date of Birth: 01/13/1966 Referring Provider:   Flowsheet Row Pulmonary Rehab from 11/11/2022 in [facility] Card[facility] Rehab  Referring Provider Darrin Barn       Encounter Date: 01/31/2023  Check In:  Session Check In - 01/31/23 0726       Check-In   Supervising physician immediately available to respond to emergencies See telemetry face sheet for immediately available ER MD    Location [facility]-Cardiac & Pulmonary Rehab    Staff Present Selinda Pereyra, RDN, Milas Hint, BS, ACSM CEP, Exercise Physiologist;Everson Mott Claudene, RN, ADN    Virtual Visit No    Medication changes reported     No    Fall or balance concerns reported    No    Warm-up and Cool-down Performed on first and last piece of equipment    Resistance Training Performed Yes    VAD Patient? No    PAD/SET Patient? No      Pain Assessment   Currently in Pain? No/denies                Social History   Tobacco Use  Smoking Status Former   Current packs/day: 0.00   Average packs/day: 1.5 packs/day for 20.0 years (30.0 ttl pk-yrs)   Types: Cigarettes   Start date: 12/19/2001   Quit date: 12/19/2021   Years since quitting: 1.1  Smokeless Tobacco Never    Goals Met:  Independence with exercise equipment Exercise tolerated well No report of concerns or symptoms today Strength training completed today  Goals Unmet:  Not Applicable  Comments: Pt able to follow exercise prescription today without complaint.  Will continue to monitor for progression.    Dr. Oneil Pinal is Medical Director for [facility] Cardiac Rehabilitation.  Dr. Fuad Aleskerov is Medical Director for [facility] Pulmonary Rehabilitation.

## 2023-02-01 ENCOUNTER — Encounter: Payer: Medicaid Other | Admitting: *Deleted

## 2023-02-01 DIAGNOSIS — J4489 Other specified chronic obstructive pulmonary disease: Secondary | ICD-10-CM | POA: Diagnosis not present

## 2023-02-01 DIAGNOSIS — J449 Chronic obstructive pulmonary disease, unspecified: Secondary | ICD-10-CM

## 2023-02-01 NOTE — Progress Notes (Signed)
 Daily Session Note  Patient Details  Name: DESTON BILYEU MRN: [id number] Date of Birth: Oct 13, 1965 Referring Provider:   Flowsheet Row Pulmonary Rehab from 11/11/2022 in [facility] Cardiac and Pulmonary Rehab  Referring Provider Darrin Barn       Encounter Date: 02/01/2023  Check In:  Session Check In - 02/01/23 0939       Check-In   Supervising physician immediately available to respond to emergencies See telemetry face sheet for immediately available ER MD    Location [facility]-Cardiac & Pulmonary Rehab    Staff Present Othel Durand, RN, BSN, CCRP;Noah Tickle, BS, Exercise Physiologist;Maxon Conetta BS, , Exercise Physiologist;Margaret Best, MS, Exercise Physiologist;Jason Elnor, RDN, LDN    Virtual Visit No    Medication changes reported     No    Fall or balance concerns reported    No    Warm-up and Cool-down Performed on first and last piece of equipment    Resistance Training Performed Yes    VAD Patient? No    PAD/SET Patient? No      Pain Assessment   Currently in Pain? No/denies                Social History   Tobacco Use  Smoking Status Former   Current packs/day: 0.00   Average packs/day: 1.5 packs/day for 20.0 years (30.0 ttl pk-yrs)   Types: Cigarettes   Start date: 12/19/2001   Quit date: 12/19/2021   Years since quitting: 1.1  Smokeless Tobacco Never    Goals Met:  Proper associated with RPD/PD & O2 Sat Independence with exercise equipment Exercise tolerated well No report of concerns or symptoms today  Goals Unmet:  Not Applicable  Comments: Pt able to follow exercise prescription today without complaint.  Will continue to monitor for progression.    Dr. Oneil Pinal is Medical Director for [facility] Cardiac Rehabilitation.  Dr. Fuad Aleskerov is Medical Director for [facility] Pulmonary Rehabilitation.

## 2023-02-02 ENCOUNTER — Ambulatory Visit: Payer: Medicaid Other | Admitting: Clinical

## 2023-02-02 ENCOUNTER — Ambulatory Visit (INDEPENDENT_AMBULATORY_CARE_PROVIDER_SITE_OTHER): Payer: Medicaid Other | Admitting: Family Medicine

## 2023-02-02 ENCOUNTER — Other Ambulatory Visit: Payer: Self-pay

## 2023-02-02 ENCOUNTER — Encounter: Payer: Self-pay | Admitting: Family Medicine

## 2023-02-02 VITALS — BP 120/84 | HR 60 | Resp 18 | Ht 73.0 in | Wt 198.3 lb

## 2023-02-02 DIAGNOSIS — J439 Emphysema, unspecified: Secondary | ICD-10-CM | POA: Diagnosis not present

## 2023-02-02 DIAGNOSIS — Z125 Encounter for screening for malignant neoplasm of prostate: Secondary | ICD-10-CM

## 2023-02-02 DIAGNOSIS — I10 Essential (primary) hypertension: Secondary | ICD-10-CM

## 2023-02-02 DIAGNOSIS — Z23 Encounter for immunization: Secondary | ICD-10-CM | POA: Diagnosis not present

## 2023-02-02 DIAGNOSIS — R1013 Epigastric pain: Secondary | ICD-10-CM

## 2023-02-02 DIAGNOSIS — N1831 Chronic kidney disease, stage 3a: Secondary | ICD-10-CM

## 2023-02-02 DIAGNOSIS — R739 Hyperglycemia, unspecified: Secondary | ICD-10-CM | POA: Diagnosis not present

## 2023-02-02 DIAGNOSIS — Z8719 Personal history of other diseases of the digestive system: Secondary | ICD-10-CM | POA: Insufficient documentation

## 2023-02-02 DIAGNOSIS — F323 Major depressive disorder, single episode, severe with psychotic features: Secondary | ICD-10-CM | POA: Diagnosis not present

## 2023-02-02 DIAGNOSIS — J984 Other disorders of lung: Secondary | ICD-10-CM

## 2023-02-02 DIAGNOSIS — F321 Major depressive disorder, single episode, moderate: Secondary | ICD-10-CM

## 2023-02-02 MED ORDER — OMEPRAZOLE 20 MG PO CPDR
20.0000 mg | DELAYED_RELEASE_CAPSULE | Freq: Every day | ORAL | 3 refills | Status: DC
  Filled 2023-02-02: qty 30, 30d supply, fill #0
  Filled 2023-03-26 – 2023-03-31 (×3): qty 30, 30d supply, fill #1
  Filled 2023-05-05: qty 30, 30d supply, fill #2
  Filled 2023-07-07: qty 30, 30d supply, fill #3

## 2023-02-02 MED ORDER — VALSARTAN 80 MG PO TABS
80.0000 mg | ORAL_TABLET | Freq: Every day | ORAL | 3 refills | Status: DC
  Filled 2023-02-02: qty 90, 90d supply, fill #0
  Filled 2023-05-05: qty 90, 90d supply, fill #1
  Filled 2023-07-07 – 2023-10-02 (×3): qty 90, 90d supply, fill #2
  Filled 2023-12-10: qty 90, 90d supply, fill #3

## 2023-02-02 NOTE — Assessment & Plan Note (Addendum)
 Stable.  - continue sertraline  50 mg daily - wrote patient letter for [employer]otional [employer] Animal; advised him this is very restricted and does not allow the animal into places where service animals are allowed as service animals are a separate designation and have to be trained to provide their owner a service. He expressed understanding of this restriction.

## 2023-02-02 NOTE — Patient Instructions (Addendum)
 Please arrange to get your Covid vaccine at the pharmacy.  Start valsartan 80 mg daily and stop metoprolol.  Omeprazole is for the reflux (chest discomfort).
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
## 2023-02-02 NOTE — Assessment & Plan Note (Addendum)
 Follows with pulmonary. Attending pulmonary rehab three times weekly, plans to continue post-graduation next month. No new pulmonary concerns. Emphasized continued exercise post-rehab. - Continue pulmonary rehab - Encourage post-rehab exercise

## 2023-02-02 NOTE — Progress Notes (Signed)
   Larry Barthel, LCSW

## 2023-02-02 NOTE — Assessment & Plan Note (Signed)
 Intermittent dull pressure started this morning, likely reflux-related given onset after medications and fluids. History of reflux, not on current medication. Declined EKG, agreed to try omeprazole . - Prescribe omeprazole  daily for two weeks - Advise ER visit if symptoms worsen

## 2023-02-02 NOTE — Assessment & Plan Note (Signed)
 Hx multiple elevated blood glucoses. Will check A1c today.

## 2023-02-02 NOTE — Progress Notes (Signed)
 Established patient visit   Patient: Larry Maxwell   DOB: [birth year]   58 y.o. Male  MRN: [id number] Visit Date: 02/02/2023  Today's healthcare provider: LAURAINE LOISE BUOY, DO   Chief Complaint  Patient presents with   Hypertension   COPD   Subjective    Hypertension  COPD His past medical history is significant for COPD.    The patient, with a history of hypertension, mixed restrictive and obstructive lung disease, and chronic back pain, presents with a new onset of chest pain described as a dull pressure, localized in the upper chest. The pain started this morning after he took his blood pressure medication and also occurred one day a week ago, but became more noticeable this morning . The patient denies any similarity to previous chest pain experienced. He also reports frequent burping.   The patient's hypertension is managed with amlodipine , chlorthalidone , metoprolol , and valsartan . He reports taking all four medications regularly. He also mentions taking gabapentin  and unspecified pain medication for back pain.  He is currently participating in a pulmonary rehabilitation program and plans to continue exercising at a wellness center after graduation from the program.  The patient recently received approval for disability due to his medical conditions. He has also been seeing a therapist and psychiatrist for his depression. He has expressed interest in obtaining an emotional support animal and requested a letter today.     Medications: Outpatient Medications Prior to Visit  Medication Sig   albuterol  (VENTOLIN  HFA) 108 (90 Base) MCG/ACT inhaler Inhale 2 puffs into the lungs every 6 (six) hours as needed for wheezing or shortness of breath.   amLODipine  (NORVASC ) 10 MG tablet Take 1 tablet (10 mg total) by mouth daily for 30 doses.   atorvastatin (LIPITOR) 20 MG tablet Take by mouth.   benzonatate  (TESSALON ) 100 MG capsule Take 1 capsule (100 mg total) by mouth 2 (two)  times daily as needed for cough.   Budeson-Glycopyrrol-Formoterol  (BREZTRI  AEROSPHERE) 160-9-4.8 MCG/ACT AERO Inhale 2 puffs into the lungs in the morning and at bedtime.   celecoxib  (CELEBREX ) 100 MG capsule Take 1 capsule (100 mg total) by mouth 2 (two) times daily.   cetirizine  (ZYRTEC ) 10 MG tablet Take 1 tablet (10 mg total) by mouth daily.   chlorthalidone  (HYGROTON ) 25 MG tablet Take 1 tablet (25 mg total) by mouth daily.   DULoxetine  (CYMBALTA ) 60 MG capsule Take 1 tablet by mouth daily.   gabapentin  (NEURONTIN ) 300 MG capsule Take 1 capsule (300 mg total) by mouth in the morning AND 1 capsule (300 mg total) daily AND 2 capsules (600 mg total) at bedtime.   ipratropium-albuterol  (DUONEB) 0.5-2.5 (3) MG/3ML SOLN Take 3 mLs by nebulization every 6 (six) hours as needed.   loratadine  (CLARITIN ) 10 MG tablet Take by mouth.   methocarbamol  (ROBAXIN ) 500 MG tablet Take 1 tablet (500 mg total) by mouth every 6 (six) hours as needed for muscle spasms.   montelukast  (SINGULAIR ) 10 MG tablet Take 1 tablet (10 mg total) by mouth at bedtime.   naloxone (NARCAN) nasal spray 4 mg/0.1 mL Place into the nose.   rosuvastatin  (CRESTOR ) 10 MG tablet Take 1 tablet (10 mg total) by mouth daily.   sertraline  (ZOLOFT ) 50 MG tablet Take 1 tablet (50 mg total) by mouth daily.   [DISCONTINUED] carvedilol  (COREG ) 6.25 MG tablet Take by mouth.   [DISCONTINUED] hydrochlorothiazide (HYDRODIURIL) 25 MG tablet TAKE 1 TABLET BY MOUTH DAILY AT NIGHT   [DISCONTINUED] metoprolol   succinate (TOPROL -XL) 25 MG 24 hr tablet Take 1 tablet (25 mg total) by mouth daily.   [DISCONTINUED] valsartan  (DIOVAN ) 40 MG tablet Take 1 tablet (40 mg total) by mouth daily. Start taking after one week, if top BP number is >125   [DISCONTINUED] chlorthalidone  (HYGROTON ) 25 MG tablet TAKE 1 TABLET (25 MG TOTAL) BY MOUTH DAILY.   No facility-administered medications prior to visit.       Objective    BP 120/84   Pulse 60   Resp 18   Ht 6'  1 (1.854 m)   Wt 198 lb 4.8 oz (89.9 kg)   SpO2 99%   BMI 26.16 kg/m     Physical Exam Vitals and nursing note reviewed.  Constitutional:      General: He is not in acute distress.    Appearance: Normal appearance.  HENT:     Head: Normocephalic and atraumatic.  Eyes:     General: No scleral icterus.    Conjunctiva/sclera: Conjunctivae normal.  Cardiovascular:     Rate and Rhythm: Normal rate.  Pulmonary:     Effort: Pulmonary effort is normal.  Neurological:     Mental Status: He is alert and oriented to person, place, and time. Mental status is at baseline.  Psychiatric:        Mood and Affect: Mood normal.        Behavior: Behavior normal.      No results found for any visits on 02/02/23.  Assessment & Plan    Primary hypertension Assessment & Plan: Blood pressure management currently with amlodipine  10 mg, chlorthalidone , metoprolol  25 mg, and valsartan  40 mg. Pulse consistently 60s-70s (except during COPD exacerbation). Discussed discontinuing metoprolol  and increasing valsartan . Monitors blood pressure daily at pulmonary rehab. Emphasized medication adherence and monitoring. - Stop metoprolol  (no indications outside of HTN noted) - Continue amlodipine  10 mg - Continue chlorthalidone  - Increase valsartan  dose to 80 mg daily - Order metabolic panel, A1c, and cholesterol panel - Follow up in 4 weeks  Orders: -     Comprehensive metabolic panel -     Lipid panel -     Valsartan ; Take 1 tablet (80 mg total) by mouth daily.  Dispense: 90 tablet; Refill: 3  Mixed restrictive and obstructive lung disease (HCC) Assessment & Plan: Follows with pulmonary. Attending pulmonary rehab three times weekly, plans to continue post-graduation next month. No new pulmonary concerns. Emphasized continued exercise post-rehab. - Continue pulmonary rehab - Encourage post-rehab exercise   Blood glucose elevated Assessment & Plan: Hx multiple elevated blood glucoses. Will check  A1c today.  Orders: -     Hemoglobin A1c  Epigastric discomfort Assessment & Plan: Intermittent dull pressure started this morning, likely reflux-related given onset after medications and fluids. History of reflux, not on current medication. Declined EKG, agreed to try omeprazole . - Prescribe omeprazole  daily for two weeks - Advise ER visit if symptoms worsen  Orders: -     Omeprazole ; Take 1 capsule (20 mg total) by mouth daily.  Dispense: 30 capsule; Refill: 3  Need for shingles vaccine -     Varicella-zoster vaccine IM  Encounter for Prevnar pneumococcal vaccination -     Pneumococcal conjugate vaccine 20-valent  Prostate cancer screening -     PSA Total (Reflex To Free)  Depression, major, single episode, moderate (HCC) Assessment & Plan: Stable.  - continue sertraline  50 mg daily - wrote patient letter for Emotional Support Animal; advised him this is very restricted and does not  allow the animal into places where service animals are allowed as service animals are a separate designation and have to be trained to provide their owner a service. He expressed understanding of this restriction.   Chronic kidney disease, stage 3a (HCC) Assessment & Plan: Stable. Rechecking CMP today.   General Health Maintenance Discussed vaccinations and screenings. Plans for COVID-19 vaccine at pharmacy. Eligible for Prevnar 20 and shingles vaccine. Emphasized healthy weight and regular exercise. - Administer Prevnar 20 vaccine - Administer shingles vaccine - Remind to get COVID-19 vaccine at pharmacy  Return in about 4 weeks (around 03/02/2023) for HTN.      I discussed the assessment and treatment plan with the patient  The patient was provided an opportunity to ask questions and all were answered. The patient agreed with the plan and demonstrated an understanding of the instructions.   The patient was advised to call back or seek an in-person evaluation if the symptoms worsen or if the  condition fails to improve as anticipated.    LAURAINE LOISE BUOY, DO  [facility] [phone] (phone) [phone] (fax)  [facility]

## 2023-02-02 NOTE — Assessment & Plan Note (Signed)
 Stable. Rechecking CMP today.

## 2023-02-02 NOTE — Assessment & Plan Note (Signed)
 Blood pressure management currently with amlodipine  10 mg, chlorthalidone , metoprolol  25 mg, and valsartan  40 mg. Pulse consistently 60s-70s (except during COPD exacerbation). Discussed discontinuing metoprolol  and increasing valsartan . Monitors blood pressure daily at pulmonary rehab. Emphasized medication adherence and monitoring. - Stop metoprolol  (no indications outside of HTN noted) - Continue amlodipine  10 mg - Continue chlorthalidone  - Increase valsartan  dose to 80 mg daily - Order metabolic panel, A1c, and cholesterol panel - Follow up in 4 weeks

## 2023-02-03 ENCOUNTER — Encounter: Payer: Medicaid Other | Admitting: *Deleted

## 2023-02-03 DIAGNOSIS — J4489 Other specified chronic obstructive pulmonary disease: Secondary | ICD-10-CM | POA: Diagnosis not present

## 2023-02-03 DIAGNOSIS — J449 Chronic obstructive pulmonary disease, unspecified: Secondary | ICD-10-CM

## 2023-02-03 NOTE — Progress Notes (Signed)
 Daily Session Note  Patient Details  Name: KEWON STATLER MRN: [id number] Date of Birth: 11-25-1965 Referring Provider:   Flowsheet Row Pulmonary Rehab from 11/11/2022 in [facility] Card[facility]  Referring Provider Darrin Barn       Encounter Date: 02/03/2023  Check In:  Session Check In - 02/03/23 0747       Check-In   Supervising physician immediately available to respond to emergencies See telemetry face sheet for immediately available ER MD    Location [facility]-Cardiac & Pulmonary Rehab    Staff Present Othel Durand, RN, BSN, CCRP;Joseph Hood, RCP,RRT,BSRT;Maxon [place] BS, , Exercise Physiologist;Lesia Monica Claudene, RN, [place]    Virtual Visit No    Medication changes reported     No    Fall or balance concerns reported    No    Warm-up and Cool-down Performed on first and last piece of equipment    Resistance Training Performed Yes    VAD Patient? No    PAD/SET Patient? No      Pain Assessment   Currently in Pain? No/denies                Social History   Tobacco Use  Smoking Status Former   Current packs/day: 0.00   Average packs/day: 1.5 packs/day for 20.0 years (30.0 ttl pk-yrs)   Types: Cigarettes   Start date: 12/19/2001   Quit date: 12/19/2021   Years since quitting: 1.1  Smokeless Tobacco Never    Goals Met:  Independence with exercise equipment Exercise tolerated well No report of concerns or symptoms today Strength training completed today  Goals Unmet:  Not Applicable  Comments: Pt able to follow exercise prescription today without complaint.  Will continue to monitor for progression.    Dr. Oneil Pinal is Medical Director for [facility] Cardiac Rehabilitation.  Dr. Fuad Aleskerov is Medical Director for [facility] Pulmonary Rehabilitation.

## 2023-02-08 ENCOUNTER — Encounter: Payer: Medicaid Other | Admitting: *Deleted

## 2023-02-08 ENCOUNTER — Ambulatory Visit: Payer: Medicaid Other | Admitting: Clinical

## 2023-02-08 ENCOUNTER — Other Ambulatory Visit: Payer: Self-pay

## 2023-02-08 DIAGNOSIS — J449 Chronic obstructive pulmonary disease, unspecified: Secondary | ICD-10-CM

## 2023-02-08 DIAGNOSIS — J4489 Other specified chronic obstructive pulmonary disease: Secondary | ICD-10-CM | POA: Diagnosis not present

## 2023-02-08 NOTE — Progress Notes (Signed)
 Daily Session Note  Patient Details  Name: Larry Maxwell MRN: [id number] Date of Birth: 09-26-1965 Referring Provider:   Flowsheet Row Pulmonary Rehab from 11/11/2022 in [facility] Cardiac and Pulmonary Rehab  Referring Provider Darrin Barn       Encounter Date: 02/08/2023  Check In:  Session Check In - 02/08/23 0819       Check-In   Supervising physician immediately available to respond to emergencies See telemetry face sheet for immediately available ER MD    Location [facility]-Cardiac & Pulmonary Rehab    Staff Present Othel Durand, RN, BSN, CCRP;Margaret Best, MS, Exercise Physiologist;Krista Jacques RN, BSN;Maxon Conetta BS, , Exercise Physiologist    Virtual Visit No    Medication changes reported     No    Fall or balance concerns reported    No    Warm-up and Cool-down Performed on first and last piece of equipment    Resistance Training Performed Yes    VAD Patient? No    PAD/SET Patient? No      Pain Assessment   Currently in Pain? No/denies                Social History   Tobacco Use  Smoking Status Former   Current packs/day: 0.00   Average packs/day: 1.5 packs/day for 20.0 years (30.0 ttl pk-yrs)   Types: Cigarettes   Start date: 12/19/2001   Quit date: 12/19/2021   Years since quitting: 1.1  Smokeless Tobacco Never    Goals Met:  Proper associated with RPD/PD & O2 Sat Independence with exercise equipment Exercise tolerated well No report of concerns or symptoms today  Goals Unmet:  Not Applicable     Pt able to follow exercise prescription today without complaint.  Will continue to monitor for progression.    Dr. Oneil Pinal is Medical Director for [facility] Cardiac Rehabilitation.  Dr. Fuad Aleskerov is Medical Director for [facility] Pulmonary Rehabilitation.

## 2023-02-09 ENCOUNTER — Encounter: Payer: Self-pay | Admitting: *Deleted

## 2023-02-09 DIAGNOSIS — J449 Chronic obstructive pulmonary disease, unspecified: Secondary | ICD-10-CM

## 2023-02-09 NOTE — Progress Notes (Signed)
 Pulmonary Individual Treatment Plan  Patient Details  Name: Larry Maxwell MRN: [id number] Date of Birth: 03-03-1965 Referring Provider:   Flowsheet Row Pulmonary Rehab from 11/11/2022 in [facility] Cardiac and Pulmonary Rehab  Referring Provider Jean-Pierre Assaker       Initial Encounter Date:  Flowsheet Row Pulmonary Rehab from 11/11/2022 in [facility] Cardiac and Pulmonary Rehab  Date 11/11/22       Visit Diagnosis: Chronic obstructive pulmonary disease, unspecified COPD type (HCC)  Patient's Home Medications on Admission:  Current Outpatient Medications:    albuterol  (VENTOLIN  HFA) 108 (90 Base) MCG/ACT inhaler, Inhale 2 puffs into the lungs every 6 (six) hours as needed for wheezing or shortness of breath., Disp: 18 g, Rfl: 1   amLODipine  (NORVASC ) 10 MG tablet, Take 1 tablet (10 mg total) by mouth daily for 30 doses., Disp: 30 tablet, Rfl: 2   atorvastatin (LIPITOR) 20 MG tablet, Take by mouth., Disp: , Rfl:    benzonatate  (TESSALON ) 100 MG capsule, Take 1 capsule (100 mg total) by mouth 2 (two) times daily as needed for cough., Disp: 20 capsule, Rfl: 0   Budeson-Glycopyrrol-Formoterol  (BREZTRI  AEROSPHERE) 160-9-4.8 MCG/ACT AERO, Inhale 2 puffs into the lungs in the morning and at bedtime., Disp: , Rfl:    celecoxib  (CELEBREX ) 100 MG capsule, Take 1 capsule (100 mg total) by mouth 2 (two) times daily., Disp: 60 capsule, Rfl: 2   cetirizine  (ZYRTEC ) 10 MG tablet, Take 1 tablet (10 mg total) by mouth daily., Disp: 30 tablet, Rfl: 11   chlorthalidone  (HYGROTON ) 25 MG tablet, Take 1 tablet (25 mg total) by mouth daily., Disp: 90 tablet, Rfl: 0   DULoxetine  (CYMBALTA ) 60 MG capsule, Take 1 tablet by mouth daily., Disp: , Rfl:    gabapentin  (NEURONTIN ) 300 MG capsule, Take 1 capsule (300 mg total) by mouth in the morning AND 1 capsule (300 mg total) daily AND 2 capsules (600 mg total) at bedtime., Disp: 120 capsule, Rfl: 0   ipratropium-albuterol  (DUONEB) 0.5-2.5 (3) MG/3ML SOLN, Take 3 mLs by  nebulization every 6 (six) hours as needed., Disp: 360 mL, Rfl: 3   loratadine  (CLARITIN ) 10 MG tablet, Take by mouth., Disp: , Rfl:    methocarbamol  (ROBAXIN ) 500 MG tablet, Take 1 tablet (500 mg total) by mouth every 6 (six) hours as needed for muscle spasms., Disp: 20 tablet, Rfl: 0   montelukast  (SINGULAIR ) 10 MG tablet, Take 1 tablet (10 mg total) by mouth at bedtime., Disp: 30 tablet, Rfl: 0   naloxone (NARCAN) nasal spray 4 mg/0.1 mL, Place into the nose., Disp: , Rfl:    omeprazole  (PRILOSEC) 20 MG capsule, Take 1 capsule (20 mg total) by mouth daily., Disp: 30 capsule, Rfl: 3   rosuvastatin  (CRESTOR ) 10 MG tablet, Take 1 tablet (10 mg total) by mouth daily., Disp: 90 tablet, Rfl: 3   sertraline  (ZOLOFT ) 50 MG tablet, Take 1 tablet (50 mg total) by mouth daily., Disp: 30 tablet, Rfl: 3   valsartan  (DIOVAN ) 80 MG tablet, Take 1 tablet (80 mg total) by mouth daily., Disp: 90 tablet, Rfl: 3  Past Medical History: Past Medical History:  Diagnosis Date   Allergy    Asthma    COPD (chronic obstructive pulmonary disease) (HCC)    Depression    Gynecomastia, male 07/21/2022   Hypertension     Tobacco Use: Social History   Tobacco Use  Smoking Status Former   Current packs/day: 0.00   Average packs/day: 1.5 packs/day for 20.0 years (30.0 ttl pk-yrs)  Types: Cigarettes   Start date: 12/19/2001   Quit date: 12/19/2021   Years since quitting: 1.1  Smokeless Tobacco Never    Labs: Review Flowsheet       Latest Ref Rng & Units 07/23/2022  Labs for ITP Cardiac and Pulmonary Rehab  Cholestrol 100 - 199 mg/dL 147   LDL (calc) 0 - 99 mg/dL 829   HDL-C >56 mg/dL 47   Trlycerides 0 - 213 mg/dL 086      Pulmonary Assessment Scores:  Pulmonary Assessment Scores     Row Name 11/11/22 1325         ADL UCSD   ADL Phase Entry     SOB Score total 53     Rest 4     Walk 3     Stairs 3     Bath 4     Dress 2     Shop 0       CAT Score   CAT Score 31       mMRC Score    mMRC Score 2              UCSD: Self-administered rating of dyspnea associated with activities of daily living (ADLs) 6-point scale (0 = "not at all" to 5 = "maximal or unable to do because of breathlessness")  Scoring Scores range from 0 to 120.  Minimally important difference is 5 units  CAT: CAT can identify the health impairment of COPD patients and is better correlated with disease progression.  CAT has a scoring range of zero to 40. The CAT score is classified into four groups of low (less than 10), medium (10 - 20), high (21-30) and very high (31-40) based on the impact level of disease on health status. A CAT score over 10 suggests significant symptoms.  A worsening CAT score could be explained by an exacerbation, poor medication adherence, poor inhaler technique, or progression of COPD or comorbid conditions.  CAT MCID is 2 points  mMRC: mMRC (Modified Medical Research Council) Dyspnea Scale is used to assess the degree of baseline functional disability in patients of respiratory disease due to dyspnea. No minimal important difference is established. A decrease in score of 1 point or greater is considered a positive change.   Pulmonary Function Assessment:  Pulmonary Function Assessment - 11/08/22 0938       Breath   Shortness of Breath Yes             Exercise Target Goals: Exercise Program Goal: Individual exercise prescription set using results from initial 6 min walk test and THRR while considering  patient's activity barriers and safety.   Exercise Prescription Goal: Initial exercise prescription builds to 30-45 minutes a day of aerobic activity, 2-3 days per week.  Home exercise guidelines will be given to patient during program as part of exercise prescription that the participant will acknowledge.  Education: Aerobic Exercise: - Group verbal and visual presentation on the components of exercise prescription. Introduces F.I.T.T principle from ACSM for  exercise prescriptions.  Reviews F.I.T.T. principles of aerobic exercise including progression. Written material given at graduation. Flowsheet Row Pulmonary Rehab from 02/03/2023 in [facility] Cardiac and Pulmonary Rehab  Date 02/03/23  Educator [facility]  Instruction Review Code 1- [employer] Understanding       Education: Resistance Exercise: - Group verbal and visual presentation on the components of exercise prescription. Introduces F.I.T.T principle from ACSM for exercise prescriptions  Reviews F.I.T.T. principles of resistance exercise including progression. Written material given  at graduation. Flowsheet Row Pulmonary Rehab from 02/03/2023 in [facility] Cardiac and Pulmonary Rehab  Date 01/27/23  Educator NT  Instruction Review Code 1- Verbalizes Understanding        Education: Exercise & Equipment Safety: - Individual verbal instruction and demonstration of equipment use and safety with use of the equipment. Flowsheet Row Pulmonary Rehab from 02/03/2023 in [facility] Cardiac and Pulmonary Rehab  Date 11/11/22  Educator NT  Instruction Review Code 1- Verbalizes Understanding       Education: Exercise Physiology & General Exercise Guidelines: - Group verbal and written instruction with models to review the exercise physiology of the cardiovascular system and associated critical values. Provides general exercise guidelines with specific guidelines to those with heart or lung disease.  Flowsheet Row Pulmonary Rehab from 02/03/2023 in [facility] Cardiac and Pulmonary Rehab  Date 01/20/23  Educator [facility]  Instruction Review Code 1- [employer] Understanding       Education: Flexibility, Balance, Mind/Body Relaxation: - Group verbal and visual presentation with interactive activity on the components of exercise prescription. Introduces F.I.T.T principle from ACSM for exercise prescriptions. Reviews F.I.T.T. principles of flexibility and balance exercise training including progression. Also discusses the mind body  connection.  Reviews various relaxation techniques to help reduce and manage stress (i.e. Deep breathing, progressive muscle relaxation, and visualization). Balance handout provided to take home. Written material given at graduation. Flowsheet Row Pulmonary Rehab from 02/03/2023 in [facility] Cardiac and Pulmonary Rehab  Date 01/27/23  Educator NT  Instruction Review Code 1- Verbalizes Understanding       Activity Barriers & Risk Stratification:  Activity Barriers & Cardiac Risk Stratification - 11/11/22 1332       Activity Barriers & Cardiac Risk Stratification   Activity Barriers Back Problems;Shortness of Breath;Other (comment)    Comments Sciatica L nerve             6 Minute Walk:  6 Minute Walk     Row Name 11/11/22 1329         6 Minute Walk   Phase Initial     Distance 1030 feet     Walk Time 6 minutes     # of Rest Breaks 0     MPH 1.95     METS 3.65     RPE 12     Perceived Dyspnea  2     VO2 Peak 12.78     Symptoms Yes (comment)     Comments cough, chest toghtness/pain 7/10     Resting HR 78 bpm     Resting BP 124/78     Resting Oxygen Saturation  98 %     Exercise Oxygen Saturation  during 6 min walk 94 %     Max Ex. HR 107 bpm     Max Ex. BP 148/84     2 Minute Post BP 128/76       Interval HR   1 Minute HR 107     2 Minute HR 100     3 Minute HR 103     4 Minute HR 99     5 Minute HR 103     6 Minute HR 96     2 Minute Post HR 87     Interval Heart Rate? Yes       Interval Oxygen   Interval Oxygen? Yes     Baseline Oxygen Saturation % 98 %     1 Minute Oxygen Saturation % 98 %     1  Minute Liters of Oxygen 0 L  RA     2 Minute Oxygen Saturation % 99 %     2 Minute Liters of Oxygen 0 L     3 Minute Oxygen Saturation % 98 %     3 Minute Liters of Oxygen 0 L     4 Minute Oxygen Saturation % 94 %     4 Minute Liters of Oxygen 0 L     5 Minute Oxygen Saturation % 97 %     5 Minute Liters of Oxygen 0 L     6 Minute Oxygen Saturation % 98 %      6 Minute Liters of Oxygen 0 L     2 Minute Post Oxygen Saturation % 99 %     2 Minute Post Liters of Oxygen 0 L             Oxygen Initial Assessment:  Oxygen Initial Assessment - 11/08/22 0938       Home Oxygen   Home Oxygen Device None    Sleep Oxygen Prescription None    Home Exercise Oxygen Prescription None    Home Resting Oxygen Prescription None      Initial 6 min Walk   Oxygen Used None      Program Oxygen Prescription   Program Oxygen Prescription None      Intervention   Short Term Goals To learn and understand importance of maintaining oxygen saturations>88%;To learn and understand importance of monitoring SPO2 with pulse oximeter and demonstrate accurate use of the pulse oximeter.;To learn and demonstrate proper pursed lip breathing techniques or other breathing techniques. ;To learn and demonstrate proper use of respiratory medications    Long  Term Goals Verbalizes importance of monitoring SPO2 with pulse oximeter and return demonstration;Maintenance of O2 saturations>88%;Exhibits proper breathing techniques, such as pursed lip breathing or other method taught during program session;Compliance with respiratory medication;Demonstrates proper use of MDI's             Oxygen Re-Evaluation:  Oxygen Re-Evaluation     Row Name 11/16/22 0832 12/02/22 0738 01/06/23 0757 01/20/23 0804 01/20/23 0810     Program Oxygen Prescription   Program Oxygen Prescription None None None None --     Home Oxygen   Home Oxygen Device None None None None --   Sleep Oxygen Prescription None None None None --   Home Exercise Oxygen Prescription None None None None --   Home Resting Oxygen Prescription None None None None --   Compliance with Home Oxygen Use Yes -- -- -- --     Goals/Expected Outcomes   Short Term Goals To learn and demonstrate proper pursed lip breathing techniques or other breathing techniques.  To learn and demonstrate proper pursed lip breathing techniques or  other breathing techniques.  To learn and understand importance of maintaining oxygen saturations>88% To learn and demonstrate proper use of respiratory medications --   Long  Term Goals Exhibits proper breathing techniques, such as pursed lip breathing or other method taught during program session Exhibits proper breathing techniques, such as pursed lip breathing or other method taught during program session Maintenance of O2 saturations>88% Compliance with respiratory medication;Demonstrates proper use of MDI's --   Comments Reviewed PLB technique with pt.  Talked about how it works and it's importance in maintaining their exercise saturations. Informed patient how to perform the Pursed Lipped breathing technique. Told patient to Inhale through the nose and out the mouth with pursed lips to keep  their airways open, help oxygenate them better, practice when at rest or doing strenuous activity. Patient Verbalizes understanding of technique and will work on and be reiterated during [facility]. He does not have a pulse oximeter to check his oxygen saturation at home. Informed him where to get one and explained why it is important to have one. Reviewed that oxygen saturations should be 88 percent and above. Nat continues to manage his  inhalers and nebulizer every day. He carries his rescue inhaler with him every where he goes. He states that his breathing has been better and he works to [employer] his meds every day to help withhis breathing. Artem continues to manage his  inhalers and nebulizer every day. He carries his rescue inhaler with him every where he goes. He states that his breathing has been better and he works to [employer] his meds every day to help withhis breathing.  He has not had a flare for over 3 weeks.   Breathing is good with ADl's   Goals/Expected Outcomes Short: Become more profiecient at using PLB. Long: Become independent at using PLB. Short: use PLB with exertion. Long: use PLB on exertion  proficiently and independently. Short: monitor oxygen at home with exertion. Long: maintain oxygen saturations above 88 percent independently. STG continue with med use as prescribed. always have rescue inhaler with him. LTG Medication management continues --            Oxygen Discharge (Final Oxygen Re-Evaluation):  Oxygen Re-Evaluation - 01/20/23 0810       Goals/Expected Outcomes   Comments Brently continues to manage his  inhalers and nebulizer every day. He carries his rescue inhaler with him every where he goes. He states that his breathing has been better and he works to [employer] his meds every day to help withhis breathing.  He has not had a flare for over 3 weeks.   Breathing is good with ADl's             Initial Exercise Prescription:  Initial Exercise Prescription - 11/11/22 1300       Date of Initial Exercise RX and Referring Provider   Date 11/11/22    Referring Provider Marianne Shirts Assaker      Oxygen   Maintain Oxygen Saturation 88% or higher      Treadmill   MPH 1.8    Grade 0.5    Minutes 15    METs 2.5      NuStep   Level 2   T6 nustep   SPM 80    Minutes 15    METs 3.65      REL-XR   Level 3    Speed 50    Minutes 15    METs 3.65      Prescription Details   Frequency (times per week) 2    Duration Progress to 30 minutes of continuous aerobic without signs/symptoms of physical distress      Intensity   THRR 40-80% of Max Heartrate 112-146    Ratings of Perceived Exertion 11-13    Perceived Dyspnea 0-4      Progression   Progression Continue to progress workloads to maintain intensity without signs/symptoms of physical distress.      Resistance Training   Training Prescription Yes    Weight 4 lb    Reps 10-15             Perform Capillary Blood Glucose checks as needed.  Exercise Prescription Changes:   Exercise Prescription Changes  Row Name 11/11/22 1300 12/02/22 1600 12/16/22 0900 12/28/22 0900 12/29/22 1000      Response to Exercise   Blood Pressure (Admit) 124/78 134/74 138/70 -- 122/68   Blood Pressure (Exercise) 148/84 178/100 146/84 -- 130/70   Blood Pressure (Exit) 128/76 142/90 136/74 -- 118/92   Heart Rate (Admit) 78 bpm 61 bpm 70 bpm -- 77 bpm   Heart Rate (Exercise) 107 bpm 110 bpm 105 bpm -- 110 bpm   Heart Rate (Exit) 87 bpm 63 bpm 98 bpm -- 96 bpm   Oxygen Saturation (Admit) 98 % 97 % 98 % -- --   Oxygen Saturation (Exercise) 94 % 96 % 95 % -- --   Oxygen Saturation (Exit) 99 % 98 % 99 % -- --   Rating of Perceived Exertion (Exercise) 12 15 15  -- 14   Perceived Dyspnea (Exercise) 2 2 3  -- 0   Symptoms cogh, chest tightness 7/10 none none -- none   Comments Results -- -- -- --   Duration -- Progress to 30 minutes of  aerobic without signs/symptoms of physical distress Progress to 30 minutes of  aerobic without signs/symptoms of physical distress -- Progress to 30 minutes of  aerobic without signs/symptoms of physical distress   Intensity -- THRR unchanged THRR unchanged -- THRR unchanged     Progression   Progression -- Continue to progress workloads to maintain intensity without signs/symptoms of physical distress. Continue to progress workloads to maintain intensity without signs/symptoms of physical distress. -- Continue to progress workloads to maintain intensity without signs/symptoms of physical distress.   Average METs -- 1.9 2.75 -- 3.6     Resistance Training   Training Prescription -- Yes Yes -- Yes   Weight -- 4 lb 4 lb -- 4 lb   Reps -- 10-15 10-15 -- 10-15     Interval Training   Interval Training -- No No -- No     Treadmill   MPH -- 1.5 2.5 -- 3   Grade -- 0.5 0 -- 1.5   Minutes -- 15 15 -- 15   METs -- 2.25 2.91 -- 3.92     NuStep   Level -- 1  T6 3  T6 -- 1   Minutes -- 15 15 -- 15   METs -- 1.6 2.1 -- 2.7     REL-XR   Level -- 1 2 -- 3   Minutes -- 15 15 -- 15   METs -- -- 3.1 -- 4.4     T5 Nustep   Level -- -- -- -- 3   Minutes -- -- -- -- 15      Home Exercise Plan   Plans to continue exercise at -- -- -- Home (comment)  Cherise Cornelia plans to walk everyday of the week but states that he would walk for longer than normal on days that he doesnt come to rehab (30-45 min) Home (comment)  Cherise Cornelia plans to walk everyday of the week but states that he would walk for longer than normal on days that he doesnt come to rehab (30-45 min)   Frequency -- -- -- Add 3 additional days to program exercise sessions. Add 3 additional days to program exercise sessions.   Initial Home Exercises Provided -- -- -- 12/28/22 12/28/22     Oxygen   Maintain Oxygen Saturation -- 88% or higher 88% or higher -- 88% or higher    Row Name 01/12/23 1300 01/27/23 1200  Response to Exercise   Blood Pressure (Admit) 130/70 120/60      Blood Pressure (Exit) 128/64 118/62      Heart Rate (Admit) 60 bpm 74 bpm      Heart Rate (Exercise) 115 bpm 98 bpm      Heart Rate (Exit) 87 bpm 95 bpm      Oxygen Saturation (Admit) 94 % 93 %      Oxygen Saturation (Exercise) 93 % 90 %      Oxygen Saturation (Exit) 97 % 98 %      Rating of Perceived Exertion (Exercise) 13 13      Perceived Dyspnea (Exercise) 1 1      Symptoms none none      Duration Progress to 30 minutes of  aerobic without signs/symptoms of physical distress Progress to 30 minutes of  aerobic without signs/symptoms of physical distress      Intensity THRR unchanged THRR unchanged        Progression   Progression Continue to progress workloads to maintain intensity without signs/symptoms of physical distress. Continue to progress workloads to maintain intensity without signs/symptoms of physical distress.      Average METs 3.4 3.9        Resistance Training   Training Prescription Yes Yes      Weight 4 lb 4 lb      Reps 10-15 10-15        Interval Training   Interval Training No No        Treadmill   MPH 3.4 3.5      Grade 0 0      Minutes 15 15      METs 3.6 3.68        NuStep   Level 4 --       Minutes 15 --      METs 3.3 --        REL-XR   Level 4 4      Minutes 15 15      METs 6.1 6.3        T5 Nustep   Level 3 3  T6      Minutes 15 15      METs 2.4 3.4        Biostep-RELP   Level -- 3      Minutes -- 15      METs -- 4        Home Exercise Plan   Plans to continue exercise at Home (comment)  Cherise Cornelia plans to walk everyday of the week but states that he would walk for longer than normal on days that he doesnt come to rehab (30-45 min) Home (comment)  Cherise Cornelia plans to walk everyday of the week but states that he would walk for longer than normal on days that he doesnt come to rehab (30-45 min)      Frequency Add 3 additional days to program exercise sessions. Add 3 additional days to program exercise sessions.      Initial Home Exercises Provided 12/28/22 12/28/22        Oxygen   Maintain Oxygen Saturation 88% or higher --               Exercise Comments:   Exercise Comments     Row Name 11/16/22 0831           Exercise Comments First full day of exercise!  Patient was oriented to gym and equipment including functions, settings, policies, and procedures.  Patient's individual exercise prescription and treatment plan were reviewed.  All starting workloads were established based on the results of the 6 minute walk test done at initial orientation visit.  The plan for exercise progression was also introduced and progression will be customized based on patient's performance and goals.                Exercise Goals and Review:   Exercise Goals     Row Name 11/11/22 1332             Exercise Goals   Increase Physical Activity Yes       Intervention Provide advice, education, support and counseling about physical activity/exercise needs.;Develop an individualized exercise prescription for aerobic and resistive training based on initial evaluation findings, risk stratification, comorbidities and participant's personal goals.       Expected Outcomes Short  Term: Attend rehab on a regular basis to increase amount of physical activity.;Long Term: Add in home exercise to make exercise part of routine and to increase amount of physical activity.;Long Term: Exercising regularly at least 3-5 days a week.       Increase Strength and Stamina Yes       Intervention Provide advice, education, support and counseling about physical activity/exercise needs.;Develop an individualized exercise prescription for aerobic and resistive training based on initial evaluation findings, risk stratification, comorbidities and participant's personal goals.       Expected Outcomes Short Term: Increase workloads from initial exercise prescription for resistance, speed, and METs.;Short Term: Perform resistance training exercises routinely during rehab and add in resistance training at home;Long Term: Improve cardiorespiratory fitness, muscular endurance and strength as measured by increased METs and functional capacity ( )       Able to understand and use rate of perceived exertion (RPE) scale Yes       Intervention Provide education and explanation on how to use RPE scale       Expected Outcomes Short Term: Able to use RPE daily in rehab to express subjective intensity level;Long Term:  Able to use RPE to guide intensity level when exercising independently       Able to understand and use Dyspnea scale Yes       Intervention Provide education and explanation on how to use Dyspnea scale       Expected Outcomes Short Term: Able to use Dyspnea scale daily in rehab to express subjective sense of shortness of breath during exertion;Long Term: Able to use Dyspnea scale to guide intensity level when exercising independently       Knowledge and understanding of Target Heart Rate Range (THRR) Yes       Intervention Provide education and explanation of THRR including how the numbers were predicted and where they are located for reference       Expected Outcomes Short Term: Able to  state/look up THRR;Long Term: Able to use THRR to govern intensity when exercising independently;Short Term: Able to use daily as guideline for intensity in rehab       Able to check pulse independently Yes       Intervention Provide education and demonstration on how to check pulse in carotid and radial arteries.;Review the importance of being able to check your own pulse for safety during independent exercise       Expected Outcomes Short Term: Able to explain why pulse checking is important during independent exercise;Long Term: Able to check pulse independently and accurately       Understanding of Exercise Prescription  Yes       Intervention Provide education, explanation, and written materials on patient's individual exercise prescription       Expected Outcomes Short Term: Able to explain program exercise prescription;Long Term: Able to explain home exercise prescription to exercise independently                Exercise Goals Re-Evaluation :  Exercise Goals Re-Evaluation     Row Name 11/16/22 0831 12/02/22 1628 12/16/22 0941 12/28/22 0927 12/29/22 1101     Exercise Goal Re-Evaluation   Exercise Goals Review -- Increase Physical Activity;Increase Strength and Stamina;Understanding of Exercise Prescription Increase Physical Activity;Increase Strength and Stamina;Understanding of Exercise Prescription Increase Physical Activity;Able to understand and use Dyspnea scale;Understanding of Exercise Prescription;Knowledge and understanding of Target Heart Rate Range (THRR);Increase Strength and Stamina;Able to check pulse independently;Able to understand and use rate of perceived exertion (RPE) scale Increase Physical Activity;Increase Strength and Stamina;Understanding of Exercise Prescription   Comments Reviewed RPE and dyspnea scale, THR and program prescription with pt today.  Pt voiced understanding and was given a copy of goals to take home. Kelcy is off to a great start in the program. He  has been getting familiar with his exercise prescription, and the machines involved. He was able to use the readmill at a speed of 1.5 mph and an incline of 0.5%, and use the T6 nustep and XR at level 1. We will continue to monitor his progress in the program. Khade continues to do well in the program. He has been able to make improvements in his speed on the treadmill, going from a speed of 1.5 mph to 2.5 mph. He was also able to increase his level on the T6 nustep from level 1 to level 3. We will continue to monitor his progress in the program. Reviewed home exercise with pt today from 7:40 to 7:50.  Pt plans to walk outside at home for at least 30 minutes, 3 days per week for exercise.  Reviewed THR, pulse, RPE, sign and symptoms, pulse oximetery and when to call 911 or MD.  Also discussed weather considerations and indoor options.  Pt voiced understanding. Devarsh continues to do well in rehab. He has been able to increase his level on the XR to level 3. He has also increased his workload on the treadmill from 2.5 to and 0 to 1.5% incline. We will continue to monitor his progress in the program.   Expected Outcomes Short: Use RPE daily to regulate intensity. Long: Follow program prescription in THR. Short: Continue to follow current exercise prescription. Long: Continue exercise to improve strength and stamina. Short: Continue to follow current exercise prescription. Long: Continue exercise to improve strength and stamina. Short: Continue to implement home exercise. Long: Continue exercising to improve strength and stamina. Short: Continue to progressively increase treadmill workload. Long: Continue exercise to improve strength and stamina.    Row Name 01/04/23 0805 01/12/23 1401 01/20/23 0809 01/27/23 1246       Exercise Goal Re-Evaluation   Exercise Goals Review Understanding of Exercise Prescription;Able to check pulse independently;Knowledge and understanding of Target Heart Rate Range (THRR);Able to  understand and use Dyspnea scale;Able to understand and use rate of perceived exertion (RPE) scale;Increase Strength and Stamina;Increase Physical Activity Increase Physical Activity;Increase Strength and Stamina;Understanding of Exercise Prescription Increase Physical Activity Increase Physical Activity;Increase Strength and Stamina;Understanding of Exercise Prescription    Comments Andry continues to walk for 45 minutes outside everyday. He states that he is seeing a  lot of progress and continues to increase his workloads. Lafredrick continues to do well in rehab. He has recently increased his level on the T4 nustep from level 1 to 4. He also increased his speed on the treadmill from to 3.4 mph. We will continue to monitor his progress in the program. Chancey is managing his home exercise by walking 30 - 45 minutes daily.  He finds that this provides him 'Me time".    He is walking outside . He was advised to not be out with low temps, but to find an indoor walking track. Cedar is doing well in rehab. He was able to add the biostep to his exercise precription at level 3. He was not able to increase his workloads on any exercise machines, but he was able to maintain. We will continue to monitor his progress in the program.    Expected Outcomes Short: Continue to exercise independently. Long: Continue exercise to improve his strength and stamina. Short: Continue to progressively increase treadmill workload. Long: Continue exercise to improve strength and stamina. STG continue with the daily walks indoors or outdoors if weather permits.  LTG COntinued daily walks Short: Continue to progressively increase treadmill workload. Long: Continue exercise to improve strength and stamina.             Discharge Exercise Prescription (Final Exercise Prescription Changes):  Exercise Prescription Changes - 01/27/23 1200       Response to Exercise   Blood Pressure (Admit) 120/60    Blood Pressure (Exit) 118/62    Heart  Rate (Admit) 74 bpm    Heart Rate (Exercise) 98 bpm    Heart Rate (Exit) 95 bpm    Oxygen Saturation (Admit) 93 %    Oxygen Saturation (Exercise) 90 %    Oxygen Saturation (Exit) 98 %    Rating of Perceived Exertion (Exercise) 13    Perceived Dyspnea (Exercise) 1    Symptoms none    Duration Progress to 30 minutes of  aerobic without signs/symptoms of physical distress    Intensity THRR unchanged      Progression   Progression Continue to progress workloads to maintain intensity without signs/symptoms of physical distress.    Average METs 3.9      Resistance Training   Training Prescription Yes    Weight 4 lb    Reps 10-15      Interval Training   Interval Training No      Treadmill   MPH 3.5    Grade 0    Minutes 15    METs 3.68      REL-XR   Level 4    Minutes 15    METs 6.3      T5 Nustep   Level 3   T6   Minutes 15    METs 3.4      Biostep-RELP   Level 3    Minutes 15    METs 4      Home Exercise Plan   Plans to continue exercise at Home (comment)   Cherise Cornelia plans to walk everyday of the week but states that he would walk for longer than normal on days that he doesnt come to rehab (30-45 min)   Frequency Add 3 additional days to program exercise sessions.    Initial Home Exercises Provided 12/28/22             Nutrition:  Target Goals: Understanding of nutrition guidelines, daily intake of sodium 1500mg , cholesterol 200mg , calories 30%  from fat and 7% or less from saturated fats, daily to have 5 or more servings of fruits and vegetables.  Education: All About Nutrition: -Group instruction provided by verbal, written material, interactive activities, discussions, models, and posters to present general guidelines for heart healthy nutrition including fat, fiber, MyPlate, the role of sodium in heart healthy nutrition, utilization of the nutrition label, and utilization of this knowledge for meal planning. Follow up email sent as well. Written material given  at graduation. Flowsheet Row Pulmonary Rehab from 02/03/2023 in [facility] Card[facility] Pul[facility]  Education need identified 11/11/22  Date 12/09/22  Educator JG  Instruction Review Code 1- Verbalizes Understanding       Biometrics:  Pre Biometrics - 11/11/22 1333       Pre Biometrics   Height 6\' 1"  (1.854 m)    Weight 190 lb 8 oz (86.4 kg)    Waist Circumference 40 inches    Hip Circumference 41 inches    Waist to Hip Ratio 0.98 %    BMI (Calculated) 25.14    Single Leg Stand 10.4 seconds              Nutrition Therapy Plan and Nutrition Goals:  Nutrition Therapy & Goals - 11/16/22 0947       Nutrition Therapy   Diet Mediterranean    Protein (specify units) 75-90    Fiber 30 grams    Whole Grain Foods 3 servings    Saturated Fats 15 max. grams    Fruits and Vegetables 5 servings/day    Sodium 2 grams      Personal Nutrition Goals   Nutrition Goal Eat something balanced at every meal hour    Personal Goal #2 Include healthy fats to help promote health and meet calorie needs    Personal Goal #3 Meet with RD again in late Nov    Comments Patient drinking 64oz of water daily. He wants to learn how to adopt a healthy eating style. Reviewed mediterranean diet handout with him, educating on types of fats, sources, and how to read labels. He is inconsistent with eating, sometimes overeating, or snacking on junk, sometimes missing meals. Explained the importance of consistency and structure. Built out several meals and snacks with foods he likes and will eat focusing on nutrient and calorie dense foods that could be eating in small quantities with little prep work to help him be more consistent at meeting nutritional needs even when his appetite is poor. Explained that this will require continued work, he has agreed to meet again in ~78month to reassess and work through any barriers that arise while making these changes.      Intervention Plan   Intervention Prescribe, educate  and counsel regarding individualized specific dietary modifications aiming towards targeted core components such as weight, hypertension, lipid management, diabetes, heart failure and other comorbidities.;Nutrition handout(s) given to patient.    Expected Outcomes Short Term Goal: Understand basic principles of dietary content, such as calories, fat, sodium, cholesterol and nutrients.;Short Term Goal: A plan has been developed with personal nutrition goals set during dietitian appointment.;Long Term Goal: Adherence to prescribed nutrition plan.             Nutrition Assessments:  MEDIFICTS Score Key: >=70 Need to make dietary changes  40-70 Heart Healthy Diet <= 40 Therapeutic Level Cholesterol Diet  Flowsheet Row Pulmonary Rehab from 11/11/2022 in [facility] Cardiac and Pulmonary Rehab  Picture Your Plate Total Score on Admission 41  Picture Your Plate Scores: <95 Unhealthy dietary pattern with much room for improvement. 41-50 Dietary pattern unlikely to meet recommendations for good health and room for improvement. 51-60 More healthful dietary pattern, with some room for improvement.  >60 Healthy dietary pattern, although there may be some specific behaviors that could be improved.   Nutrition Goals Re-Evaluation:  Nutrition Goals Re-Evaluation     Row Name 12/02/22 0745 01/04/23 0822 01/20/23 0820 01/31/23 0906       Goals   Current Weight 186 lb (84.4 kg) 197 lb 6.4 oz (89.5 kg) -- --    Nutrition Goal -- Eat something balanced at every meal hour Eat something balanced at every meal hour --    Comment Patient was informed on why it is important to maintain a balanced diet when dealing with Respiratory issues. Explained that it takes a lot of energy to breath and when they are short of breath often they will need to have a good diet to help keep up with the calories they are expending for breathing. Patient was informed on why it is important to maintain a balanced diet when  dealing with Respiratory issues. Explained that it takes a lot of energy to breath and when they are short of breath often they will need to have a good diet to help keep up with the calories they are expending for breathing. Goals 1,2 and 3       Lemon has not met again with RD, will send reminder to RD for the appt. Miking is working on balancing his Roberts Ching has not been able to figure out how to feel full,what foods would help. He does eat oatmeal, cream of wheat and couscous. He will add peanut butter to his oatmeal. He will buy the cut up fruit at the grocery store. he avoids adding salt and does not eat fried foods. Patient drinking adequate water, cut out juice and soda, now drinking more plain water. He reports he has done well with eating smaller meals with protein and carb paired together creating more consistency and structure in his eating routine. Says he has stopped eating fried chicken with so much oil and is now using oven fried or air fried to cut back on excessive oil intake. He is not salting his food at the table or when cooking. Reminded him that less than 1500mg  sodium is his daily target. Encouraged him to read labels of foods he eats regularly to ensure processed or salty foods don't make him overshoot the 1500mg  sodium goal often. He still reports struggling with overeating at some meals. Spoke with him about ways to try and buffer hunger before meals to practice more mindful eating. Encouraged drinking water before larger meals or including more veggies at meals or fresh fruit before eating.    Expected Outcome Short: Choose and plan snacks accordingly to patients caloric intake to improve breathing. Long: Maintain a diet independently that meets their caloric intake to aid in daily shortness of breath. Short: Choose and plan snacks accordingly to patients caloric intake to improve breathing. Long: Maintain a diet independently that meets their caloric intake to aid in daily shortness of  breath. STG get advice on how to balance his meals, what to add to help him feel full.  Meet with RD soon  LTG Cherise Cornelia ahs his meal plans working on balanced meals/snacks --      Personal Goal #2 Re-Evaluation   Personal Goal #2 -- Include healthy fats to help promote health  and meet calorie needs Include healthy fats to help promote health and meet calorie needs --      Personal Goal #3 Re-Evaluation   Personal Goal #3 -- Meet with RD again in late Nov Meet with RD again in late Nov --             Nutrition Goals Discharge (Final Nutrition Goals Re-Evaluation):  Nutrition Goals Re-Evaluation - 01/31/23 0906       Goals   Comment Patient drinking adequate water, cut out juice and soda, now drinking more plain water. He reports he has done well with eating smaller meals with protein and carb paired together creating more consistency and structure in his eating routine. Says he has stopped eating fried chicken with so much oil and is now using oven fried or air fried to cut back on excessive oil intake. He is not salting his food at the table or when cooking. Reminded him that less than 1500mg  sodium is his daily target. Encouraged him to read labels of foods he eats regularly to ensure processed or salty foods don't make him overshoot the 1500mg  sodium goal often. He still reports struggling with overeating at some meals. Spoke with him about ways to try and buffer hunger before meals to practice more mindful eating. Encouraged drinking water before larger meals or including more veggies at meals or fresh fruit before eating.             Psychosocial: Target Goals: Acknowledge presence or absence of significant depression and/or stress, maximize coping skills, provide positive support system. Participant is able to verbalize types and ability to use techniques and skills needed for reducing stress and depression.   Education: Stress, Anxiety, and Depression - Group verbal and visual  presentation to define topics covered.  Reviews how body is impacted by stress, anxiety, and depression.  Also discusses healthy ways to reduce stress and to treat/manage anxiety and depression.  Written material given at graduation.   Education: Sleep Hygiene -Provides group verbal and written instruction about how sleep can affect your health.  Define sleep hygiene, discuss sleep cycles and impact of sleep habits. Review good sleep hygiene tips.    Initial Review & Psychosocial Screening:  Initial Psych Review & Screening - 11/08/22 0940       Initial Review   Current issues with Current Sleep Concerns;Current Anxiety/Panic      Family Dynamics   Good Support System? No    Strains Intra-family strains    Comments He states he does not have a great family support system. He preferes to deal with his stressors himself. At night he has trouble sleeping and gets shot of breath.      Barriers   Psychosocial barriers to participate in program The patient should benefit from training in stress management and relaxation.;There are no identifiable barriers or psychosocial needs.      Screening Interventions   Interventions Encouraged to exercise;To provide support and resources with identified psychosocial needs;Provide feedback about the scores to participant    Expected Outcomes Short Term goal: Utilizing psychosocial counselor, staff and physician to assist with identification of specific Stressors or current issues interfering with healing process. Setting desired goal for each stressor or current issue identified.;Long Term Goal: Stressors or current issues are controlled or eliminated.;Short Term goal: Identification and review with participant of any Quality of Life or Depression concerns found by scoring the questionnaire.;Long Term goal: The participant improves quality of Life and PHQ9 Scores as seen  by post scores and/or verbalization of changes             Quality of Life  Scores:  Scores of 19 and below usually indicate a poorer quality of life in these areas.  A difference of  2-3 points is a clinically meaningful difference.  A difference of 2-3 points in the total score of the Quality of Life Index has been associated with significant improvement in overall quality of life, self-image, physical symptoms, and general health in studies assessing change in quality of life.  PHQ-9: Review Flowsheet  More data exists      02/02/2023 01/25/2023 01/04/2023 12/03/2022 12/02/2022  Depression screen PHQ 2/9  Decreased Interest 1  1 2 2   Down, Depressed, Hopeless 1  1 2 2   PHQ - 2 Score 2  2 4 4   Altered sleeping 1  2 3 3   Tired, decreased energy 2  2 3 2   Change in appetite 1  1 2  0  Feeling bad or failure about yourself  0  1 2 0  Trouble concentrating 0  0 2 0  Moving slowly or fidgety/restless 0  0 1 0  Suicidal thoughts 0  0 1 0  PHQ-9 Score 6  8 18 9   Difficult doing work/chores Not difficult at all  Somewhat difficult - Somewhat difficult    Details       Information is confidential and restricted. Go to Review Flowsheets to unlock data.        Interpretation of Total Score  Total Score Depression Severity:  1-4 = Minimal depression, 5-9 = Mild depression, 10-14 = Moderate depression, 15-19 = Moderately severe depression, 20-27 = Severe depression   Psychosocial Evaluation and Intervention:  Psychosocial Evaluation - 11/08/22 0945       Psychosocial Evaluation & Interventions   Interventions Encouraged to exercise with the program and follow exercise prescription;Relaxation education;Stress management education    Comments He states he does not have a great family support system. He preferes to deal with his stressors himself. At night he has trouble sleeping and gets shot of breath.    Expected Outcomes Short: Start [facility] to help with mood. Long: Maintain a healthy mental state.    Continue Psychosocial Services  Follow up required by staff              Psychosocial Re-Evaluation:  Psychosocial Re-Evaluation     Row Name 12/02/22 0748 01/04/23 0812 01/20/23 0813         Psychosocial Re-Evaluation   Current issues with Current Stress Concerns Current Stress Concerns Current Stress Concerns;Current Sleep Concerns     Comments Reviewed patient health questionnaire (PHQ-9) with patient for follow up. Previously, patients score indicated signs/symptoms of depression.  Reviewed to see if patient is improving symptom wise while in program.  Score improved and patient states that it is because he is taking care of his health more and seeing a therapist. Reviewed patient health questionnaire (PHQ-9) with patient for follow up. Previously, patients score indicated signs/symptoms of depression.  Reviewed to see if patient is improving symptom wise while in program.  Score improved and patient states that it is because he is taking care of his health more and seeing a therapist. Patient states that after his diagnosis with stage 4 lung cancer he dealt with a lot of depression. He states that his continuous exercise has helped him mentally, he also has a therapist that he sees once a week that helps him talk  about his situation. Arye is doing well withhis sleep, he does have meds prescribed to help with sleep. He does not use the meds every night as he is fatigued from the day and can fall asleep easily and sleep through his night.  He does go to bed at 730 and has been waking up at 230 AM.  HIs health has been a stress for [place]. He is managing his stress with a psychiatrist and a Veterinary surgeon . He stsates that he is feeling better and wants to continue taking care of himself.  His concern is that he has appointments almost daily and he has no time for himself.  We talked about talking to his counselor and seeing if in the next month the appointments could be stretched out so he has days without appointments. He has no other support beyond himeself and  his faith.     Expected Outcomes Short: Continue to attend [facility] regularly for regular exercise and social engagement. Long: Continue to improve symptoms and manage a positive mental state. Short: Continue to attend [facility] regularly for regular exercise and social engagement. Long: Continue to improve symptoms and manage a positive mental state. STG Gaige will check if he can have a day without appointments. He will continue to walk daily and work on his sleep cycle.  LTG Sunshine does not need appointments so close together and he continues to manage his health and stress.     Interventions Encouraged to attend Pulmonary Rehabilitation for the exercise Encouraged to attend Pulmonary Rehabilitation for the exercise Encouraged to attend Pulmonary Rehabilitation for the exercise     Continue Psychosocial Services  Follow up required by staff Follow up required by staff Follow up required by staff              Psychosocial Discharge (Final Psychosocial Re-Evaluation):  Psychosocial Re-Evaluation - 01/20/23 0813       Psychosocial Re-Evaluation   Current issues with Current Stress Concerns;Current Sleep Concerns    Comments Garmon is doing well withhis sleep, he does have meds prescribed to help with sleep. He does not use the meds every night as he is fatigued from the day and can fall asleep easily and sleep through his night.  He does go to bed at 730 and has been waking up at 230 AM.  HIs health has been a stress for [place]. He is managing his stress with a psychiatrist and a Veterinary surgeon . He stsates that he is feeling better and wants to continue taking care of himself.  His concern is that he has appointments almost daily and he has no time for himself.  We talked about talking to his counselor and seeing if in the next month the appointments could be stretched out so he has days without appointments. He has no other support beyond himeself and his faith.    Expected Outcomes STG Jacqueline will check if  he can have a day without appointments. He will continue to walk daily and work on his sleep cycle.  LTG Cledith does not need appointments so close together and he continues to manage his health and stress.    Interventions Encouraged to attend Pulmonary Rehabilitation for the exercise    Continue Psychosocial Services  Follow up required by staff             Education: Education Goals: Education classes will be provided on a weekly basis, covering required topics. Participant will state understanding/return demonstration of topics presented.  Learning Barriers/Preferences:  Learning Barriers/Preferences - 11/08/22 4132       Learning Barriers/Preferences   Learning Barriers None    Learning Preferences None             General Pulmonary Education Topics:  Infection Prevention: - Provides verbal and written material to individual with discussion of infection control including proper hand washing and proper equipment cleaning during exercise session. Flowsheet Row Pulmonary Rehab from 02/03/2023 in [facility] Cardiac and Pulmonary Rehab  Date 11/11/22  Educator NT  Instruction Review Code 1- Verbalizes Understanding       Falls Prevention: - Provides verbal and written material to individual with discussion of falls prevention and safety. Flowsheet Row Pulmonary Rehab from 02/03/2023 in [facility] Cardiac and Pulmonary Rehab  Date 11/11/22  Educator NT  Instruction Review Code 1- Verbalizes Understanding       Chronic Lung Disease Review: - Group verbal instruction with posters, models, PowerPoint presentations and videos,  to review new updates, new respiratory medications, new advancements in procedures and treatments. Providing information on websites and "800" numbers for continued self-education. Includes information about supplement oxygen, available portable oxygen systems, continuous and intermittent flow rates, oxygen safety, concentrators, and Medicare reimbursement for  oxygen. Explanation of Pulmonary Drugs, including class, frequency, complications, importance of spacers, rinsing mouth after steroid MDI's, and proper cleaning methods for nebulizers. Review of basic lung anatomy and physiology related to function, structure, and complications of lung disease. Review of risk factors. Discussion about methods for diagnosing sleep apnea and types of masks and machines for OSA. Includes a review of the use of types of environmental controls: home humidity, furnaces, filters, dust mite/pet prevention, HEPA vacuums. Discussion about weather changes, air quality and the benefits of nasal washing. Instruction on Warning signs, infection symptoms, calling MD promptly, preventive modes, and value of vaccinations. Review of effective airway clearance, coughing and/or vibration techniques. Emphasizing that all should Create an Action Plan. Written material given at graduation. Flowsheet Row Pulmonary Rehab from 02/03/2023 in [facility] Cardiac and Pulmonary Rehab  Education need identified 11/11/22  Date 01/06/23  Educator [facility]  Instruction Review Code 1- Verbalizes Understanding       AED/CPR: - Group verbal and written instruction with the use of models to demonstrate the basic use of the AED with the basic ABC's of resuscitation.    Anatomy and Cardiac Procedures: - Group verbal and visual presentation and models provide information about basic cardiac anatomy and function. Reviews the testing methods done to diagnose heart disease and the outcomes of the test results. Describes the treatment choices: Medical Management, Angioplasty, or Coronary Bypass Surgery for treating various heart conditions including Myocardial Infarction, Angina, Valve Disease, and Cardiac Arrhythmias.  Written material given at graduation. Flowsheet Row Pulmonary Rehab from 02/03/2023 in [facility] Cardiac and Pulmonary Rehab  Date 12/16/22  Educator SB  Instruction Review Code 1- Verbalizes Understanding        Medication Safety: - Group verbal and visual instruction to review commonly prescribed medications for heart and lung disease. Reviews the medication, class of the drug, and side effects. Includes the steps to properly store meds and maintain the prescription regimen.  Written material given at graduation. Flowsheet Row Pulmonary Rehab from 02/03/2023 in [facility] Cardiac and Pulmonary Rehab  Date 12/21/22  Educator [facility]  Instruction Review Code 1- Verbalizes Understanding       Other: -Provides group and verbal instruction on various topics (see comments)   Knowledge Questionnaire Score:  Knowledge Questionnaire Score - 11/11/22 1324  Knowledge Questionnaire Score   Pre Score 15/18              Core Components/Risk Factors/Patient Goals at Admission:  Personal Goals and Risk Factors at Admission - 11/08/22 0939       Core Components/Risk Factors/Patient Goals on Admission    Weight Management Yes;Weight Maintenance    Intervention Weight Management: Develop a combined nutrition and exercise program designed to reach desired caloric intake, while maintaining appropriate intake of nutrient and fiber, sodium and fats, and appropriate energy expenditure required for the weight goal.;Weight Management: Provide education and appropriate resources to help participant work on and attain dietary goals.;Weight Management/Obesity: Establish reasonable short term and long term weight goals.    Expected Outcomes Short Term: Continue to assess and modify interventions until short term weight is achieved;Long Term: Adherence to nutrition and physical activity/exercise program aimed toward attainment of established weight goal;Weight Maintenance: Understanding of the daily nutrition guidelines, which includes 25-35% calories from fat, 7% or less cal from saturated fats, less than 200mg  cholesterol, less than 1.5gm of sodium, & 5 or more servings of fruits and vegetables daily;Understanding  recommendations for meals to include 15-35% energy as protein, 25-35% energy from fat, 35-60% energy from carbohydrates, less than 200mg  of dietary cholesterol, 20-35 gm of total fiber daily;Understanding of distribution of calorie intake throughout the day with the consumption of 4-5 meals/snacks    Improve shortness of breath with ADL's Yes    Intervention Provide education, individualized exercise plan and daily activity instruction to help decrease symptoms of SOB with activities of daily living.    Expected Outcomes Short Term: Improve cardiorespiratory fitness to achieve a reduction of symptoms when performing ADLs;Long Term: Be able to perform more ADLs without symptoms or delay the onset of symptoms    Hypertension Yes    Intervention Provide education on lifestyle modifcations including regular physical activity/exercise, weight management, moderate sodium restriction and increased consumption of fresh fruit, vegetables, and low fat dairy, alcohol moderation, and smoking cessation.;Monitor prescription use compliance.    Expected Outcomes Short Term: Continued assessment and intervention until BP is < 140/27mm HG in hypertensive participants. < 130/85mm HG in hypertensive participants with diabetes, heart failure or chronic kidney disease.;Long Term: Maintenance of blood pressure at goal levels.    Lipids Yes    Intervention Provide education and support for participant on nutrition & aerobic/resistive exercise along with prescribed medications to achieve LDL 70mg , HDL >40mg .    Expected Outcomes Short Term: Participant states understanding of desired cholesterol values and is compliant with medications prescribed. Participant is following exercise prescription and nutrition guidelines.;Long Term: Cholesterol controlled with medications as prescribed, with individualized exercise RX and with personalized nutrition plan. Value goals: LDL < 70mg , HDL > 40 mg.              Education:Diabetes - Individual verbal and written instruction to review signs/symptoms of diabetes, desired ranges of glucose level fasting, after meals and with exercise. Acknowledge that pre and post exercise glucose checks will be done for 3 sessions at entry of program.   Know Your Numbers and Heart Failure: - Group verbal and visual instruction to discuss disease risk factors for cardiac and pulmonary disease and treatment options.  Reviews associated critical values for Overweight/Obesity, Hypertension, Cholesterol, and Diabetes.  Discusses basics of heart failure: signs/symptoms and treatments.  Introduces Heart Failure Zone chart for action plan for heart failure.  Written material given at graduation. Flowsheet Row Pulmonary Rehab from 02/03/2023 in [facility]  Cardiac and Pulmonary Rehab  Date 12/30/22  Educator SB  Instruction Review Code 1- Verbalizes Understanding       Core Components/Risk Factors/Patient Goals Review:   Goals and Risk Factor Review     Row Name 12/02/22 0744 01/04/23 0809 01/20/23 0826         Core Components/Risk Factors/Patient Goals Review   Personal Goals Review Improve shortness of breath with ADL's Improve shortness of breath with ADL's Weight Management/Obesity;Improve shortness of breath with ADL's;Hypertension;Lipids     Review Spoke to patient about their shortness of breath and what they can do to improve. Patient has been informed of breathing techniques when starting the program. Patient is informed to tell staff if they have had any med changes and that certain meds they are taking or not taking can be causing shortness of breath. Patient states that he has seen major improvements with his shortness of breath, due to his exercise at home as well as in the program. Patient has been informed of breathing techniques when starting the program. Patient is informed to tell staff if they have had any med changes and that certain meds they are taking or not  taking can be causing shortness of breath. Izaah continues to see improvement with his shortness of breath with ADL's. He has not had a flare up in the past 3 weeks . He carries his recus inhaler with his at all times. He continues to work on weight loss.  The holiday has put a puse on that!  he had lost about 15 pounds prior to attending the program.  He is gaining back that weight. He is working on balanced meals,  with less fats, fried foods and sodium.  He continues to manage his meds and take them as prescribed.  BP readings are in normal range.. NO recent cholesterol labs to review.     Expected Outcomes Short: Attend [facility] regularly to improve shortness of breath with ADL's. Long: maintain independence with ADL's Short: Attend [facility] regularly to improve shortness of breath with ADL's. Long: maintain independence with ADL's STG continue to work on the weight, HTN and cholesterol with the exercise, and nutrition changes he is making during the program.  COntinue with daily inhaler and nebuizer therapy to manage the shortness of breath symptoms with ADLs.  LTG Nickolous continues to work on his goals with progress that leads to met.   Nutriton changes, taking meds and continuing the exercise progression.              Core Components/Risk Factors/Patient Goals at Discharge (Final Review):   Goals and Risk Factor Review - 01/20/23 0826       Core Components/Risk Factors/Patient Goals Review   Personal Goals Review Weight Management/Obesity;Improve shortness of breath with ADL's;Hypertension;Lipids    Review Sani continues to see improvement with his shortness of breath with ADL's. He has not had a flare up in the past 3 weeks . He carries his recus inhaler with his at all times. He continues to work on weight loss.  The holiday has put a puse on that!  he had lost about 15 pounds prior to attending the program.  He is gaining back that weight. He is working on balanced meals,  with less fats,  fried foods and sodium.  He continues to manage his meds and take them as prescribed.  BP readings are in normal range.. NO recent cholesterol labs to review.    Expected Outcomes STG continue to work on  the weight, HTN and cholesterol with the exercise, and nutrition changes he is making during the program.  COntinue with daily inhaler and nebuizer therapy to manage the shortness of breath symptoms with ADLs.  LTG Keatyn continues to work on his goals with progress that leads to met.   Nutriton changes, taking meds and continuing the exercise progression.             ITP Comments:  ITP Comments     Row Name 11/08/22 5784 11/11/22 1322 11/16/22 0831 11/24/22 1244 12/15/22 1140   ITP Comments Virtual Visit completed. Patient informed on EP and RD appointment and 6 Minute walk test. Patient also informed of patient health questionnaires on My Chart. Patient Verbalizes understanding. Visit diagnosis can be found in [facility] 10/14/[facility]. Completed and gym orientation. Initial ITP created and sent for review to Dr. Faud Aleskerov, Medical Director. First full day of exercise!  Patient was oriented to gym and equipment including functions, settings, policies, and procedures.  Patient's individual exercise prescription and treatment plan were reviewed.  All starting workloads were established based on the results of the 6 minute walk test done at initial orientation visit.  The plan for exercise progression was also introduced and progression will be customized based on patient's performance and goals. 30 Day review completed. Medical Director ITP review done, changes made as directed, and signed approval by Medical Director.    new to prgram 30 Day review completed. Medical Director ITP review done, changes made as directed, and signed approval by Medical Director.    Row Name 01/12/23 1000 02/09/23 1309         ITP Comments 30 Day review completed. Medical Director ITP review done, changes made as  directed, and signed approval by Medical Director. 30 Day review completed. Medical Director ITP review done, changes made as directed, and signed approval by Medical Director.               Comments:

## 2023-02-10 ENCOUNTER — Encounter: Payer: Medicaid Other | Admitting: *Deleted

## 2023-02-10 VITALS — Ht 73.0 in | Wt 200.7 lb

## 2023-02-10 DIAGNOSIS — J449 Chronic obstructive pulmonary disease, unspecified: Secondary | ICD-10-CM

## 2023-02-10 DIAGNOSIS — J4489 Other specified chronic obstructive pulmonary disease: Secondary | ICD-10-CM | POA: Diagnosis not present

## 2023-02-10 NOTE — Patient Instructions (Addendum)
Discharge Patient Instructions  Patient Details  Name: Larry Maxwell MRN: [id number] Date of Birth: 12-29-1965 Referring Provider:  Sherlyn Hay, DO   Number of Visits: 18  Reason for Discharge:  Patient reached a stable level of exercise. Patient independent in their exercise. Patient has met program and personal goals.   Diagnosis:  Chronic obstructive pulmonary disease, unspecified COPD type (HCC)  Initial Exercise Prescription:  Initial Exercise Prescription - 11/11/22 1300       Date of Initial Exercise RX and Referring Provider   Date 11/11/22    Referring Provider [place] Assaker      Oxygen   Maintain Oxygen Saturation 88% or higher      Treadmill   MPH 1.8    Grade 0.5    Minutes 15    METs 2.5      NuStep   Level 2   T6 nustep   SPM 80    Minutes 15    METs 3.65      REL-XR   Level 3    Speed 50    Minutes 15    METs 3.65      Prescription Details   Frequency (times per week) 2    Duration Progress to 30 minutes of continuous aerobic without signs/symptoms of physical distress      Intensity   THRR 40-80% of Max Heartrate 112-146    Ratings of Perceived Exertion 11-13    Perceived Dyspnea 0-4      Progression   Progression Continue to progress workloads to maintain intensity without signs/symptoms of physical distress.      Resistance Training   Training Prescription Yes    Weight 4 lb    Reps 10-15             Discharge Exercise Prescription (Final Exercise Prescription Changes):  Exercise Prescription Changes - 01/27/23 1200       Response to Exercise   Blood Pressure (Admit) 120/60    Blood Pressure (Exit) 118/62    Heart Rate (Admit) 74 bpm    Heart Rate (Exercise) 98 bpm    Heart Rate (Exit) 95 bpm    Oxygen Saturation (Admit) 93 %    Oxygen Saturation (Exercise) 90 %    Oxygen Saturation (Exit) 98 %    Rating of Perceived Exertion (Exercise) 13    Perceived Dyspnea (Exercise) 1    Symptoms none    Duration  Progress to 30 minutes of  aerobic without signs/symptoms of physical distress    Intensity THRR unchanged      Progression   Progression Continue to progress workloads to maintain intensity without signs/symptoms of physical distress.    Average METs 3.9      Resistance Training   Training Prescription Yes    Weight 4 lb    Reps 10-15      Interval Training   Interval Training No      Treadmill   MPH 3.5    Grade 0    Minutes 15    METs 3.68      REL-XR   Level 4    Minutes 15    METs 6.3      T5 Nustep   Level 3   T6   Minutes 15    METs 3.4      Biostep-RELP   Level 3    Minutes 15    METs 4      Home Exercise Plan   Plans to continue exercise  at Home (comment)   [name] plans to walk everyday of the week but states that he would walk for longer than normal on days that he doesnt come to rehab (30-45 min)   Frequency Add 3 additional days to program exercise sessions.    Initial Home Exercises Provided 12/28/22             Functional Capacity:  6 Minute Walk     Row Name 11/11/22 1329 02/10/23 0750       6 Minute Walk   Phase Initial Discharge    Distance 1030 feet 1315 feet    Distance % Change -- 27.7 %    Distance Feet Change -- 285 ft    Walk Time 6 minutes 6 minutes    # of Rest Breaks 0 0    MPH 1.95 2.49    METS 3.65 3.71    RPE 12 11    Perceived Dyspnea  2 0    VO2 Peak 12.78 12.97    Symptoms Yes (comment) No    Comments cough, chest toghtness/pain 7/10 --    Resting HR 78 bpm 68 bpm    Resting BP 124/78 114/58    Resting Oxygen Saturation  98 % 98 %    Exercise Oxygen Saturation  during 6 min walk 94 % 93 %    Max Ex. HR 107 bpm 90 bpm    Max Ex. BP 148/84 124/66    2 Minute Post BP 128/76 118/62      Interval HR   1 Minute HR 107 83    2 Minute HR 100 88    3 Minute HR 103 90    4 Minute HR 99 88    5 Minute HR 103 89    6 Minute HR 96 82    2 Minute Post HR 87 64    Interval Heart Rate? Yes Yes      Interval Oxygen    Interval Oxygen? Yes Yes    Baseline Oxygen Saturation % 98 % 98 %    1 Minute Oxygen Saturation % 98 % 98 %    1 Minute Liters of Oxygen 0 L  RA 0 L  RA    2 Minute Oxygen Saturation % 99 % 98 %    2 Minute Liters of Oxygen 0 L 0 L    3 Minute Oxygen Saturation % 98 % 96 %    3 Minute Liters of Oxygen 0 L 0 L    4 Minute Oxygen Saturation % 94 % 96 %    4 Minute Liters of Oxygen 0 L 0 L    5 Minute Oxygen Saturation % 97 % 96 %    5 Minute Liters of Oxygen 0 L 0 L    6 Minute Oxygen Saturation % 98 % 93 %    6 Minute Liters of Oxygen 0 L 0 L    2 Minute Post Oxygen Saturation % 99 % 96 %    2 Minute Post Liters of Oxygen 0 L 0 L             Nutrition & Weight - Outcomes:  Pre Biometrics - 11/11/22 1333       Pre Biometrics   Height 6\' 1"  (1.854 m)    Weight 190 lb 8 oz (86.4 kg)    Waist Circumference 40 inches    Hip Circumference 41 inches    Waist to Hip Ratio 0.98 %  BMI (Calculated) 25.14    Single Leg Stand 10.4 seconds             Post Biometrics - 02/10/23 0753        Post  Biometrics   Height 6\' 1"  (1.854 m)    Weight 200 lb 11.2 oz (91 kg)    Waist Circumference 44 inches    Hip Circumference 42 inches    Waist to Hip Ratio 1.05 %    BMI (Calculated) 26.48    Single Leg Stand 30 seconds            Nutrition:  Nutrition Therapy & Goals - 11/16/22 0947       Nutrition Therapy   Diet Mediterranean    Protein (specify units) 75-90    Fiber 30 grams    Whole Grain Foods 3 servings    Saturated Fats 15 max. grams    Fruits and Vegetables 5 servings/day    Sodium 2 grams      Personal Nutrition Goals   Nutrition Goal Eat something balanced at every meal hour    Personal Goal #2 Include healthy fats to help promote health and meet calorie needs    Personal Goal #3 Meet with RD again in late Nov    Comments Patient drinking 64oz of water daily. He wants to learn how to adopt a healthy eating style. Reviewed mediterranean diet handout with  him, educating on types of fats, sources, and how to read labels. He is inconsistent with eating, sometimes overeating, or snacking on junk, sometimes missing meals. Explained the importance of consistency and structure. Built out several meals and snacks with foods he likes and will eat focusing on nutrient and calorie dense foods that could be eating in small quantities with little prep work to help him be more consistent at meeting nutritional needs even when his appetite is poor. Explained that this will require continued work, he has agreed to meet again in ~53month to reassess and work through any barriers that arise while making these changes.      Intervention Plan   Intervention Prescribe, educate and counsel regarding individualized specific dietary modifications aiming towards targeted core components such as weight, hypertension, lipid management, diabetes, heart failure and other comorbidities.;Nutrition handout(s) given to patient.    Expected Outcomes Short Term Goal: Understand basic principles of dietary content, such as calories, fat, sodium, cholesterol and nutrients.;Short Term Goal: A plan has been developed with personal nutrition goals set during dietitian appointment.;Long Term Goal: Adherence to prescribed nutrition plan.

## 2023-02-10 NOTE — Progress Notes (Signed)
Daily Session Note  Patient Details  Name: Larry Maxwell MRN: [id number] Date of Birth: 1965/04/14 Referring Provider:   Flowsheet Row Pulmonary Rehab from 11/11/2022 in [facility] Cardiac and Pulmonary Rehab  Referring Provider Janann Colonel       Encounter Date: 02/10/2023  Check In:  Session Check In - 02/10/23 0738       Check-In   Supervising physician immediately available to respond to emergencies See telemetry face sheet for immediately available ER MD    Location [facility]-Cardiac & Pulmonary Rehab    Staff Present Cora Collum, RN, BSN, CCRP;Joseph Hood RCP,RRT,BSRT;Noah [place], [place], Exercise Physiologist;Jason Wallace Cullens RDN,LDN    Virtual Visit No    Medication changes reported     No    Fall or balance concerns reported    No    Warm-up and Cool-down Performed on first and last piece of equipment    Resistance Training Performed Yes    VAD Patient? No    PAD/SET Patient? No      Pain Assessment   Currently in Pain? No/denies                Social History   Tobacco Use  Smoking Status Former   Current packs/day: 0.00   Average packs/day: 1.5 packs/day for 20.0 years (30.0 ttl pk-yrs)   Types: Cigarettes   Start date: 12/19/2001   Quit date: 12/19/2021   Years since quitting: 1.1  Smokeless Tobacco Never    Goals Met:  Proper associated with RPD/PD & O2 Sat Independence with exercise equipment Exercise tolerated well No report of concerns or symptoms today  Goals Unmet:  Not Applicable  Comments: Pt able to follow exercise prescription today without complaint.  Will continue to monitor for progression.    Dr. Bethann Punches is Medical Director for [facility] Cardiac Rehabilitation.  Dr. Vida Rigger is Medical Director for [facility] Pulmonary Rehabilitation.

## 2023-02-11 ENCOUNTER — Other Ambulatory Visit: Payer: Self-pay | Admitting: Licensed Clinical Social Worker

## 2023-02-11 NOTE — Patient Instructions (Signed)
Visit Information  Larry Maxwell was given information about Medicaid Managed Care team care coordination services as a part of their [place] benefit. Larry Maxwell verbally consented to engagement with the [employer] Managed Care team.   If you are experiencing a medical emergency, please call 911 or report to your local emergency department or urgent care.   If you have a non-emergency medical problem during routine business hours, please contact your provider's office and ask to speak with a nurse.   For questions related to your [place] health plan, please call: [phone]  If you would like to schedule transportation through your [place] plan, please call the following number at least 2 days in advance of your appointment: [phone].   There is no limit to the number of trips during the year between medical appointments, healthcare facilities, or pharmacies. Transportation must be scheduled at least 2 business days before but not more than thirty 30 days before of your appointment.  Call the Behavioral Health Crisis Line at [phone], at any time, 24 hours a day, 7 days a week. If you are in danger or need immediate medical attention call 911.  If you would like help to quit smoking, call 1-800-QUIT-NOW (5481224571) OR Espaol: 1-855-Djelo-Ya (4-132-440-1027) o para ms informacin haga clic aqu or Text READY to 253-664 to register via text   Following is a copy of your plan of care:  Care Plan : LCSW Plan of Care  Updates made by Larry Bryant, LCSW since 02/11/2023 12:00 AM     Problem: Depression Identification (Depression)      Goal: Depressive Symptoms Identified   Start Date: 11/26/2022  Note:   Timeframe:  Short-Range Goal Priority:  High Start Date:  11/26/22            Expected End Date: 02/11/23         Follow Up Date- LCSW has closed case as behavioral health needs have been successfully met since  establishing with long term mental health providers within the [facility].   Date/time of next scheduled Social Work care management/care coordination outreach:  02/15/23 at 10 am with [facility] BSW.  Current Barriers:  Acute Mental Health needs related to stress management and depression symptoms Knowledge Deficits related to health benefits and local community resources Financial constraints  Desire to relocate with limited housing options Need to initiate psychiatry and therapy services.   Clinical Social Work Goal(s):  Over the next 30 days, patient will work with SW monthly by telephone to reduce or manage symptoms related to stress management.  Over the next 30 days, patient will work with SW to address concerns related to finding a therapist and psychiatrist.   Patient Self Care Activities:  Self administers medications as prescribed Attends all scheduled provider appointments Attends social activities Performs ADL's independently Performs IADL's independently Ability for insight Independent living Motivation for treatment  Patient Coping Strengths:  Hopefulness Self Advocate Able to Communicate Effectively  Patient Self Care Deficits:  Lacks social connections Family is not supportive.  Activities and task to complete in order to accomplish goals.   Please continue to follow up with [facility] to arrange transportation to medical appointments [phone]  EMOTIONAL / MENTAL HEALTH SUPPORT RESOURCE EDUCATION PROIVDED- Review email that was sent today on 11/26/22 Please anticipate call from [facility]  - journal feelings and what helps to feel better or worse - watch for early signs of feeling worse - write in journal every  day    Why is this important?   Keeping track of your progress will help your treatment team find the right mix of medicine and therapy for you.  Write in your journal every day.  Day-to-day changes in depression symptoms are normal. It may be more  helpful to check your progress at the end of each week instead of every day.          24- Hour Availability:    [facility]  [address] [place], [place] [phone] Crisis [phone]   [facility] [phone]  [place] [facility]  [phone]    [facility]  [phone] (after hours)   Therapeutic Alternative/Mobile Crisis   [phone]   [place] National Suicide Hotline  (470) 087-0224 Len Childs) [place] [address]   Call (226) 037-7791 for mental health emergencies   [facility]  [phone]);  [place] and [employer]  [phone]); [place], [place], [place], [place], [place], [place], [place]    [place] [facility] for [facility] Residents For 24/7 walk-up access to mental health services for [facility] children (4+), adolescents and adults, please visit the [facility] located at [address] in [place], [place].  *[facility] also provides comprehensive outpatient behavioral health services in a variety of locations around the [place].  Connect With [place] [address] [place], [place] [address] HelpLine: (778)426-0909 or 1-(930)111-0046  Get Directions  Find Help 24/7 By Phone Call our 24-hour HelpLine at 805-250-0748 or 660-646-6157 for immediate assistance for mental health and substance abuse issues.  Walk-In Help [place]: [facility] (Ages 4 and Up) [place]: Emergency Dept., [facility] Additional Resources National Hopeline Network: 1-800-SUICIDE The National Suicide Prevention Lifeline: 1-443-154-MGQQ     Larry Maxwell, BSW, MSW, LCSW Licensed Clinical Social Worker [employer]   [facility] [email or web address] Direct Dial: [phone]

## 2023-02-11 NOTE — Patient Outreach (Signed)
Medicaid Managed Care Social Work Note  02/11/2023 Name:  GISCARD NOUN MRN:  [id number] DOB:  30-Nov-1965  Larry Maxwell is an 58 y.o. year old male who is a primary patient of Sherlyn Hay, DO.  The Medicaid Managed Care Coordination team was consulted for assistance with:  Mental Health Counseling and Resources  Mr. Helling was given information about Medicaid Managed Care Coordination team services today. [name] Patient agreed to services and verbal consent obtained.  Engaged with patient  for by telephone forfollow up visit in response to referral for case management and/or care coordination services.   Patient is participating in a Managed Medicaid Plan:  Yes  Assessments/Interventions:  Review of past medical history, allergies, medications, health status, including review of consultants reports, laboratory and other test data, was performed as part of comprehensive evaluation and provision of chronic care management services.  SDOH: (Social Drivers of Health) assessments and interventions performed: SDOH Interventions    Flowsheet Row Patient Outreach Telephone from 02/11/2023 in [place] [facility] POP[facility] DEPARTMENT Office Visit from 01/25/2023 in [facility] Patient Outreach Telephone from 01/12/2023 in [place] POPULATION HEALTH DEPARTMENT Patient Outreach Telephone from 01/11/2023 in [place] POPULATION HEALTH DEPARTMENT Pulmonary Rehab from 01/04/2023 in [facility] Cardiac [facility] Pulmonary Rehab Patient Outreach Telephone from 12/09/2022 in [place] POPULATION HEALTH DEPARTMENT  SDOH Interventions        Transportation Interventions Payor Benefit  [Modicare number emailed to pt] -- -- -- -- Payor Benefit  Utilities Interventions -- -- -- Intervention Not Indicated -- --  Alcohol Usage Interventions -- -- Intervention Not Indicated (Score <7) -- -- --  Depression Interventions/Treatment  -- Medication -- -- Counseling --   Stress Interventions Community Resources Provided, Provide Counseling -- -- -- -- --  Social Connections Interventions -- -- Intervention Not Indicated -- -- --  Health Literacy Interventions -- -- -- Intervention Not Indicated -- --       Advanced Directives Status:  See Care Plan for related entries.  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) 10/25/2022   Restrictive lung disease 08/25/2022   Paresthesia of right upper extremity 08/17/2022   History of diverticulosis 08/09/2022   Nicotine dependence with current use 08/04/2022   Prediabetes 08/02/2022   Hypertension 07/21/2022   Asthma, allergic, mild persistent, uncomplicated 07/21/2022   Depression, major, single episode, moderate (HCC) 07/21/2022   Mixed hyperlipidemia 07/21/2022   Chronic midline low back pain with left-sided sciatica 07/21/2022    Conditions to be addressed/monitored per PCP order:  Depression  Care Plan : LCSW Plan of Care  Updates made by Gustavus Bryant, LCSW since 02/11/2023 12:00 AM     Problem: Depression Identification (Depression)      Goal: Depressive Symptoms Identified   Start  Date: 11/26/2022  Note:   Timeframe:  Short-Range Goal Priority:  High Start Date:  11/26/22            Expected End Date: 02/11/23         Follow Up Date- LCSW has closed case as behavioral health needs  have been successfully met since establishing with long term mental health providers within the [facility] system.   Current Barriers:  Acute Mental Health needs related to stress management and depression symptoms Knowledge Deficits related to health benefits and local community resources Financial constraints  Desire to relocate with limited housing options Need to initiate psychiatry and therapy services.   Clinical Social Work Goal(s):  Over the next 30 days, patient will work with SW monthly by telephone to reduce or manage symptoms related to stress management.  Over the next 30 days, patient will work with SW to address concerns related to finding a therapist and psychiatrist.   Interventions: Patient interviewed and appropriate assessments performed Provided mental health counseling with regard to stress management  Discussed plans with patient for ongoing care management follow up and provided patient with direct contact information for care management team Emotional/Supportive Counseling provided  Education on mental health service enrollment process Education on the difference between therapy, psychiatry and psychology services Patient reports his main focus right now is managing his financial responsibilities, gaining mental health services and gaining housing resource education.  Email sent to patient with therapy and psychology/psychiatry options in his area Pt is currently in the process of applying for disability-working with an attorney to assist. He resides with sister who assists with meals and daily care when needed. Patient now has been successfully connected with medicaid transportation [place] [phone]. Patient reports that service is effectively working for him. Patient now has stable transportation.  Pt confirmed symptoms od depression related to his medical condition and recent break-up, encouraged verbalization of feeling and use of postive coping  strategies-self care emphasized-pt agrreable to ongoing mental health  Patient denies thoughts of harm to self or others-encouraged patient to call [phone] in the event of a mental health crisis 12/09/22- Patient has been successfully approved for food stamps. Pt reports that he has been successfully scheduled with both initial sessions for psychiatry and counseling. He is looking forward to pursuing behavioral health treatment. Per PCP and patient, he is having issues affording and managing his medications and a referral for pharmacy care management was successfully placed as well as a referral for disease management for his COPD. Patient was provided education on healthy self-care tools to implement into his daily schedule. 01/17/23- Patient confirmed stable transportation arrangements for his upcoming psychiatry appointment on 01/25/23. He reports that he is looking forward to the holidays and is hopeful with the implementation of behavioral health providers and services, his overall mood will improve. [facility] LCSW provided healthy self-care education to patient to help improve his overall health and mood management. 02/11/23 update-Patient reports doing well at this time. He shares that he may have secured stable housing at a nearby apartment complex and is hopeful that this can be successfully arranged. He denies any urgent needs at this time. He confirms building great rapport with new counselor and psychiatrist. He denies any further behavioral health needs at this time. He is agreeable to [facility] BSW and [facility] RNCM ongoing involvement. Patient is agreeable to LCSW case closure at this time. [facility] LCSW will sign off and update [facility] team.   Patient Self Care Activities:  Self  administers medications as prescribed Attends all scheduled provider appointments Attends social activities Performs ADL's independently Performs IADL's independently Ability for insight Independent living Motivation for treatment  Patient  Coping Strengths:  Hopefulness Self Advocate Able to Communicate Effectively  Patient Self Care Deficits:  Lacks social connections Family is not supportive.  Activities and task to complete in order to accomplish goals.   Please continue to follow up with [facility] to arrange transportation to medical appointments [phone]  EMOTIONAL / MENTAL HEALTH SUPPORT RESOURCE EDUCATION PROIVDED- Review email that was sent today on 11/26/22 Please anticipate call from [facility]  - journal feelings and what helps to feel better or worse - watch for early signs of feeling worse - write in journal every day    Why is this important?   Keeping track of your progress will help your treatment team find the right mix of medicine and therapy for you.  Write in your journal every day.  Day-to-day changes in depression symptoms are normal. It may be more helpful to check your progress at the end of each week instead of every day.       Follow up:  Patient agrees to Care Plan and Follow-up.  Plan: The Managed Medicaid care management team will reach out to the patient again over the next 30 days.  Date/time of next scheduled Social Work care management/care coordination outreach:  02/15/23 at 10 am with [facility] BSW.  Dickie La, BSW, MSW, LCSW Licensed Clinical Social Worker [employer]   [email or web address] Direct Dial: [phone]

## 2023-02-15 ENCOUNTER — Other Ambulatory Visit: Payer: Self-pay | Admitting: Obstetrics and Gynecology

## 2023-02-15 ENCOUNTER — Other Ambulatory Visit: Payer: Self-pay

## 2023-02-15 DIAGNOSIS — J4489 Other specified chronic obstructive pulmonary disease: Secondary | ICD-10-CM | POA: Diagnosis not present

## 2023-02-15 DIAGNOSIS — J449 Chronic obstructive pulmonary disease, unspecified: Secondary | ICD-10-CM

## 2023-02-15 NOTE — Patient Instructions (Signed)
Visit Information  Larry Maxwell was given information about Medicaid Managed Care team care coordination services as a part of their [place] benefit. [name] verbally consented to engagement with the [facility] Managed Care team.   If you are experiencing a medical emergency, please call 911 or report to your local emergency department or urgent care.   If you have a non-emergency medical problem during routine business hours, please contact your provider's office and ask to speak with a nurse.   For questions related to your [place] health plan, please call: [phone]  If you would like to schedule transportation through your [place] plan, please call the following number at least 2 days in advance of your appointment: [phone].   There is no limit to the number of trips during the year between medical appointments, healthcare facilities, or pharmacies. Transportation must be scheduled at least 2 business days before but not more than thirty 30 days before of your appointment.  Call the Behavioral Health Crisis Line at [phone], at any time, 24 hours a day, 7 days a week. If you are in danger or need immediate medical attention call 911.  If you would like help to quit smoking, call 1-800-QUIT-NOW (772 050 0642) OR Espaol: 1-855-Djelo-Ya (6-440-347-4259) o para ms informacin haga clic aqu or Text READY to 563-875 to register via text  [name] - following are the goals we discussed in your visit today:     The  Patient                                              has been provided with contact information for the Managed Medicaid care management team and has been advised to call with any health related questions or concerns.   [name], [name], MHA [facility]  Managed Medicaid Social Worker [phone]   Following is a copy of your plan of care:  There are no care plans that you recently  modified to display for this patient.

## 2023-02-15 NOTE — Patient Outreach (Signed)
Medicaid Managed Care   Nurse Care Manager Note  02/15/2023 Name:  Larry Maxwell MRN:  [id number] DOB:  1965-07-05  Larry Maxwell is an 58 y.o. year old male who is a primary patient of Larry Hay, DO.  The [facility] Managed Care Coordination team was consulted for assistance with:    Chronic healthcare management needs, HTN, asthma, COPD, LBP with left sided sciatica, HLD, depression/anxiety  Larry Maxwell was given information about Medicaid Managed Care Coordination team services today. [name] Patient agreed to services and verbal consent obtained.  Engaged with patient by telephone for follow up visit in response to provider referral for case management and/or care coordination services.   Patient is participating in a Managed Medicaid Plan:  Yes  Assessments/Interventions:  Review of past medical history, allergies, medications, health status, including review of consultants reports, laboratory and other test data, was performed as part of comprehensive evaluation and provision of chronic care management services.  SDOH (Social Drivers of Health) assessments and interventions performed: SDOH Interventions    Flowsheet Row Patient Outreach Telephone from 02/15/2023 in [place] POPULATION HEALTH DEPARTMENT Most recent reading at 02/15/2023 12:36 PM Pulmonary Rehab from 02/15/2023 in [facility] Cardiac and Pulmonary Rehab Most recent reading at 02/15/2023  7:42 AM Patient Outreach Telephone from 02/11/2023 in [place] POPULATION HEALTH DEPARTMENT Most recent reading at 02/11/2023  2:01 PM Patient Outreach Telephone from 01/12/2023 in [place] POPULATION HEALTH DEPARTMENT Most recent reading at 01/12/2023  2:04 PM Patient Outreach Telephone from 01/11/2023 in [place] POPULATION HEALTH DEPARTMENT Most recent reading at 01/11/2023  2:52 PM Pulmonary Rehab from 01/04/2023 in [facility] Cardiac and Pulmonary Rehab Most recent reading at 01/04/2023  8:35 AM  SDOH Interventions         Transportation Interventions -- -- Payor Benefit  [Modicare number emailed to pt] -- -- --  Utilities Interventions -- -- -- -- Intervention Not Indicated --  Alcohol Usage Interventions -- -- -- Intervention Not Indicated (Score <7) -- --  Depression Interventions/Treatment  -- Medication -- -- -- Counseling  Stress Interventions -- -- [employer] Provided, Provide Counseling -- -- --  Social Connections Interventions -- -- -- Intervention Not Indicated -- --  Health Literacy Interventions Intervention Not Indicated -- -- -- Intervention Not Indicated --     Care Plan Allergies  Allergen Reactions   Lisinopril Swelling    Angioedema and significant swelling in ankles as well   Medications Reviewed Today     Reviewed by Larry Chandler, RN (Registered Nurse) on 02/15/23 at 1237  Med List Status: <None>   Medication Order Taking? Sig Documenting Provider Last Dose Status Informant  albuterol (VENTOLIN HFA) 108 (90 Base) MCG/ACT inhaler [id number] No Inhale 2 puffs into the lungs every 6 (six) hours as needed for wheezing or shortness of breath. Larry Hay, DO Taking Active   amLODipine (NORVASC) 10 MG tablet [id number] No Take 1 tablet (10 mg total) by mouth daily for 30 doses. Larry Hay, DO Taking Active   atorvastatin (LIPITOR) 20 MG tablet [id number] No Take by mouth. [provider] Taking Active   benzonatate (TESSALON) 100 MG capsule [id number] No Take 1 capsule (100 mg total) by mouth 2 (two) times daily as needed for cough. Larry Hay, DO Taking Active   Budeson-Glycopyrrol-Formoterol (BREZTRI AEROSPHERE) 160-9-4.8 MCG/ACT AERO [id number] No Inhale 2 puffs into the lungs in the morning and at bedtime. [provider] Taking Active   celecoxib (CELEBREX) 100  MG capsule [id number] No Take 1 capsule (100 mg total) by mouth 2 (two) times daily. Larry Hay, DO Taking Active   cetirizine (ZYRTEC) 10 MG tablet [id number] No Take 1 tablet (10 mg  total) by mouth daily. Larry Hay, DO Taking Active   chlorthalidone (HYGROTON) 25 MG tablet [id number] No Take 1 tablet (25 mg total) by mouth daily. Larry Hay, DO Taking Active   DULoxetine (CYMBALTA) 60 MG capsule [id number] No Take 1 tablet by mouth daily. [provider] Taking Active   gabapentin (NEURONTIN) 300 MG capsule [id number] No Take 1 capsule (300 mg total) by mouth in the morning AND 1 capsule (300 mg total) daily AND 2 capsules (600 mg total) at bedtime. Larry Hay, DO Taking Active   ipratropium-albuterol (DUONEB) 0.5-2.5 (3) MG/3ML SOLN [id number] No Take 3 mLs by nebulization every 6 (six) hours as needed. Larry Colonel, MD Taking Active   loratadine (CLARITIN) 10 MG tablet [id number] No Take by mouth. [provider] Taking Active   methocarbamol (ROBAXIN) 500 MG tablet [id number] No Take 1 tablet (500 mg total) by mouth every 6 (six) hours as needed for muscle spasms. Larry Hay, DO Taking Active   montelukast (SINGULAIR) 10 MG tablet [id number] No Take 1 tablet (10 mg total) by mouth at bedtime. Larry Hay, DO Taking Active   naloxone [facility]) nasal spray 4 mg/0.1 mL [id number] No Place into the nose. [provider] Taking Active   omeprazole (PRILOSEC) 20 MG capsule [id number]  Take 1 capsule (20 mg total) by mouth daily. Larry Hay, DO  Active   rosuvastatin (CRESTOR) 10 MG tablet [id number] No Take 1 tablet (10 mg total) by mouth daily. Larry Hay, DO Taking Active   sertraline (ZOLOFT) 50 MG tablet [id number] No Take 1 tablet (50 mg total) by mouth daily. Larry Hay, DO Taking Active   valsartan (DIOVAN) 80 MG tablet [id number]  Take 1 tablet (80 mg total) by mouth daily. Larry Hay, DO  Active            Patient Active Problem List   Diagnosis Date Noted   Blood glucose elevated 02/02/2023   Epigastric discomfort 02/02/2023   History of pancreatitis 02/02/2023   Chronic kidney disease, stage 3a  (HCC) 02/02/2023   Insomnia due to medical condition 12/15/2022   Mixed restrictive and obstructive lung disease (HCC) 10/25/2022   Asthma-COPD overlap syndrome (HCC) 10/25/2022   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) 10/25/2022   Restrictive lung disease 08/25/2022   Paresthesia of right upper extremity 08/17/2022   History of diverticulosis 08/09/2022   Nicotine dependence with current use 08/04/2022   Prediabetes 08/02/2022   Hypertension 07/21/2022   Asthma, allergic, mild persistent, uncomplicated 07/21/2022   Depression, major, single episode, moderate (HCC) 07/21/2022   Mixed hyperlipidemia 07/21/2022   Chronic midline low back pain with left-sided sciatica 07/21/2022   Conditions to be addressed/monitored per PCP order:  Chronic healthcare management needs, HTN, asthma, COPD, LBP with left sided sciatica, HLD, depression/anxiety  Care Plan : RN Care Manager Plan of Care  Updates made by Larry Chandler, RN since 02/15/2023 12:00 AM     Problem: Health Promotion or Disease Self-Management (General Plan of Care)      Long-Range Goal: Chronic Disease Management and Care Coordination Needs   Start Date: 01/12/2023  Expected End Date: 04/12/2023  Priority: High  Note:   Current Barriers:  Knowledge Deficits related to plan  of care for management of HTN, asthma, COPD, LBP with left sided sciatica, HLD, depression Care Coordination needs related to housing resources Chronic Disease Management support and education needs related to HTN, asthma, COPD, LBP with left sided sciatica, HLD, depression 02/15/23:  patient has received eye and dental resources-has not scheduled yet.  Continues PULM REHAB and going well-has PULM appt tomorrow.  Counseling every week continues.  Has emotional support animal now-a cat.  BP stable.  RNCM Clinical Goal(s):  verbalize understanding of plan for management of  HTN, asthma, COPD, LBP with left sided sciatica, HLD, depressionas  evidenced by patient report. verbalize basic understanding of  HTN, asthma, COPD, LBP with left sided sciatica, HLD, depression  disease process and self health management plan as evidenced by patient report. take all medications exactly as prescribed and will call provider for medication related questions as evidenced by patient report. demonstrate understanding of rationale for each prescribed medication as evidenced by patient report. attend all scheduled medical appointments: as evidenced by patient report and EMR review. demonstrate ongoing  adherence to prescribed treatment plan for HTN, asthma, COPD, LBP with left sided sciatica, HLD, depression  as evidenced by patient report and EMR review. continue to work with RN Care Manager to address care management and care coordination needs related to HTN, asthma, COPD, LBP with left sided sciatica, HLD, depression  as evidenced by adherence to CM Team Scheduled appointments work with Child psychotherapist to address housing needs related to the management of  HTN, asthma, COPD, LBP with left sided sciatica, HLD, depressionas evidenced by review of EMR and patient or Child psychotherapist report through collaboration with Medical illustrator, provider, and care team.   Interventions: Evaluation of current treatment plan related to  self management and patient's adherence to plan as established by provider  Asthma: (Status:New goal.) Long Term Goal Discussed the importance of adequate rest and management of fatigue with Asthma Assessed social determinant of health barriers  01/12/23: patient currently attending PULM REHAB  COPD Interventions:  (Status:  New goal.) Long Term Goal Provided instruction about proper use of medications used for management of COPD including inhalers Discussed the importance of adequate rest and management of fatigue with COPD Assessed social determinant of health barriers 01/12/23: patient currently attending PULM REHAB  Hyperlipidemia  Interventions:  (Status:  New goal.) Long Term Goal Medication review performed; medication list updated in electronic medical record.  Provider established cholesterol goals reviewed Counseled on importance of regular laboratory monitoring as prescribed Reviewed importance of limiting foods high in cholesterol Assessed social determinant of health barriers   Hypertension Interventions:  (Status:  New goal.) Long Term Goal Last practice recorded BP readings:  BP Readings from Last 3 Encounters:  12/03/22 (!) 142/89  11/04/22 (!) 162/114  02/01/22                120/64   Most recent eGFR/CrCl:  Lab Results  Component Value Date   EGFR 51 (L) 07/23/2022    No components found for: "CRCL"  Evaluation of current treatment plan related to hypertension self management and patient's adherence to plan as established by provider Reviewed medications with patient and discussed importance of compliance Discussed plans with patient for ongoing care management follow up and provided patient with direct contact information for care management team Advised patient, providing education and rationale, to monitor blood pressure daily and record, calling PCP for findings outside established parameters Reviewed scheduled/upcoming provider appointments including:  Assessed social determinant of health  barriers  Patient Goals/Self-Care Activities: Take all medications as prescribed Attend all scheduled provider appointments Call pharmacy for medication refills 3-7 days in advance of running out of medications Perform all self care activities independently  Perform IADL's (shopping, preparing meals, housekeeping, managing finances) independently Call provider office for new concerns or questions  Work with the social worker to address care coordination needs and will continue to work with the clinical team to address health care and disease management related needs 02/15/23:  patient to schedule eye and  dental appts when able to.    Follow Up Plan:  The patient has been provided with contact information for the care management team and has been advised to call with any health related questions or concerns.  The care management team will reach out to the patient again over the next 30 business  days.    Long-Range Goal: Establish Plan of Care for Chronic Disease Management Needs   Priority: High  Note:   Timeframe:  Long-Range Goal Priority:  High Start Date:      01/12/23                       Expected End Date:   ongoing                    Follow Up Date 03/18/23   - practice safe sex - schedule appointment for vaccines needed due to my age or health - schedule recommended health tests  - schedule and keep appointment for annual check-up    Why is this important?   Screening tests can find diseases early when they are easier to treat.  Your doctor or nurse will talk with you about which tests are important for you.  Getting shots for common diseases like the flu and shingles will help prevent them.   02/15/23:  Continues PULM REHAB, PULM appt tomorrow and PCP next month, counseling every week.   Follow Up:  Patient agrees to Care Plan and Follow-up.  Plan: The Managed Medicaid care management team will reach out to the patient again over the next 30 business  days. and The  Patient has been provided with contact information for the Managed Medicaid care management team and has been advised to call with any health related questions or concerns.  Date/time of next scheduled RN care management/care coordination outreach: 03/18/23 at 1230.

## 2023-02-15 NOTE — Patient Outreach (Signed)
Medicaid Managed Care Social Work Note  02/15/2023 Name:  Larry Maxwell MRN:  [id number] DOB:  06-22-1965  Larry Maxwell is an 58 y.o. year old male who is a primary patient of Larry Hay, DO.  The [facility] Managed Care Coordination team was consulted for assistance with:   dental and vision  Mr. Larry Maxwell was given information about Medicaid Managed Care Coordination team services today. [name] Patient agreed to services and verbal consent obtained.  Engaged with patient  for by telephone forfollow up visit in response to referral for case management and/or care coordination services.   Patient is participating in a Managed Medicaid Plan:  Yes  Assessments/Interventions:  Review of past medical history, allergies, medications, health status, including review of consultants reports, laboratory and other test data, was performed as part of comprehensive evaluation and provision of chronic care management services.  SDOH: (Social Drivers of Health) assessments and interventions performed: SDOH Interventions    Flowsheet Row Pulmonary Rehab from 02/15/2023 in [facility]iac [facility] Pulmonary Rehab Patient Outreach Telephone from 02/11/2023 in [facility] Patient Outreach Telephone from 01/12/2023 in [facility] Patient Outreach Telephone from 01/11/2023 in [facility] Pulmonary Rehab from 01/04/2023 in [facility] Re[facility] Patient Outreach Telephone from 12/09/2022 in [facility]  SDOH Interventions        Transportation Interventions -- Payor Benefit  [Modicare number emailed to pt] -- -- -- Payor Benefit  Utilities Interventions -- -- -- Intervention Not Indicated -- --  Alcohol Usage Interventions -- -- Intervention Not Indicated (Score <7) -- -- --  Depression Interventions/Treatment  Medication -- -- -- Counseling --  Stress Interventions -- Larry Maxwell Provided, Provide Counseling -- -- -- --  Social Connections Interventions -- -- Intervention Not Indicated -- -- --  Health Literacy Interventions -- -- -- Intervention Not Indicated -- --     BSW completed a telephone outreach with patient, he state he did receieve the resources BSW sent. Patient states things are going well. No other resources are needed at this time. BSW provided patient with contact information for any future needs.   Advanced Directives Status:  Not addressed in this encounter.  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) 10/25/2022   Restrictive lung disease 08/25/2022   Paresthesia of right upper extremity 08/17/2022   History of diverticulosis 08/09/2022   Nicotine dependence with current use 08/04/2022   Prediabetes 08/02/2022   Hypertension 07/21/2022   Asthma, allergic, mild persistent, uncomplicated 07/21/2022   Depression, major, single episode, moderate (HCC) 07/21/2022   Mixed hyperlipidemia 07/21/2022   Chronic midline low back pain with left-sided sciatica 07/21/2022    Conditions to be addressed/monitored per PCP order:   dental and vision  resources  There are no care plans that you recently modified to display for this patient.   Follow up:  Patient agrees to Care Plan and Follow-up.  Plan: The  Patient has been provided with  contact information for the Managed Medicaid care management team and has been advised to call with any health related questions or concerns.    Larry Maxwell, MHA [facility]  Managed [facility] Social Worker [phone]

## 2023-02-15 NOTE — Progress Notes (Signed)
Daily Session Note  Patient Details  Name: Larry Maxwell MRN: [id number] Date of Birth: 02/03/1965 Referring Provider:   Flowsheet Row Pulmonary Rehab from 11/11/2022 in [facility] Cardiac and Pulmonary Rehab  Referring Provider Janann Colonel       Encounter Date: 02/15/2023  Check In:  Session Check In - 02/15/23 0721       Check-In   Supervising physician immediately available to respond to emergencies See telemetry face sheet for immediately available ER MD    Location [facility]-Cardiac & Pulmonary Rehab    Staff Present Kelton Pillar RN,BSN,MPA;Maxon Conetta BS, Exercise Physiologist;Margaret Best, MS, Exercise Physiologist;Noah Tickle, BS, Exercise Physiologist    Virtual Visit No    Medication changes reported     No    Fall or balance concerns reported    No    Warm-up and Cool-down Performed on first and last piece of equipment    Resistance Training Performed Yes    VAD Patient? No    PAD/SET Patient? No      Pain Assessment   Currently in Pain? No/denies                Social History   Tobacco Use  Smoking Status Former   Current packs/day: 0.00   Average packs/day: 1.5 packs/day for 20.0 years (30.0 ttl pk-yrs)   Types: Cigarettes   Start date: 12/19/2001   Quit date: 12/19/2021   Years since quitting: 1.1  Smokeless Tobacco Never    Goals Met:  Independence with exercise equipment Exercise tolerated well No report of concerns or symptoms today Strength training completed today  Goals Unmet:  Not Applicable  Comments: Pt able to follow exercise prescription today without complaint.  Will continue to monitor for progression.    Dr. Bethann Punches is Medical Director for [facility] Cardiac Rehabilitation.  Dr. Vida Rigger is Medical Director for [facility] Pulmonary Rehabilitation.

## 2023-02-15 NOTE — Patient Instructions (Signed)
Hi Larry Maxwell, I am glad the Pulmonary rehabilitation continues to go well for you-have a nice afternoon and stay warm!  Larry Maxwell was given information about Medicaid Managed Care team care coordination services as a part of their [place] Medicaid benefit. Larry Maxwell verbally consented to engagement with the [facility] Managed Care team.   If you are experiencing a medical emergency, please call 911 or report to your local emergency department or urgent care.   If you have a non-emergency medical problem during routine business hours, please contact your provider's office and ask to speak with a nurse.   For questions related to your [place] health plan, please call: [phone]  If you would like to schedule transportation through your [place] plan, please call the following number at least 2 days in advance of your appointment: [phone].   There is no limit to the number of trips during the year between medical appointments, healthcare facilities, or pharmacies. Transportation must be scheduled at least 2 business days before but not more than thirty 30 days before of your appointment.  Call the Behavioral Health Crisis Line at [phone], at any time, 24 hours a day, 7 days a week. If you are in danger or need immediate medical attention call 911.  If you would like help to quit smoking, call 1-800-QUIT-NOW (703-867-7014) OR Espaol: 1-855-Djelo-Ya (4-403-474-2595) o para ms informacin haga clic aqu or Text READY to 638-756 to register via text  Larry Maxwell - following are the goals we discussed in your visit today:   Timeframe:  Long-Range Goal Priority:  High Start Date:      01/12/23                       Expected End Date:   ongoing                    Follow Up Date 03/18/23   - practice safe sex - schedule appointment for vaccines needed due to my age or health - schedule recommended health tests  - schedule  and keep appointment for annual check-up    Why is this important?   Screening tests can find diseases early when they are easier to treat.  Your doctor or nurse will talk with you about which tests are important for you.  Getting shots for common diseases like the flu and shingles will help prevent them.   02/15/23:  Continues PULM REHAB, PULM appt tomorrow and PCP next month, counseling every week.  Patient verbalizes understanding of instructions and care plan provided today and agrees to view in MyChart. Active MyChart status and patient understanding of how to access instructions and care plan via MyChart confirmed with patient.     The Managed Medicaid care management team will reach out to the patient again over the next 30 business  days.  The  Patient  has been provided with contact information for the Managed Medicaid care management team and has been advised to call with any health related questions or concerns.   Kathi Der RN, BSN, [employer] [facility] RN Care Manager Direct Dial [phone]/ZYS [phone] Website: [email or web address]  Following is a copy of your plan of care:  Care Plan : RN Care Manager Plan of Care  Updates made by Danie Chandler, RN since 02/15/2023 12:00 AM     Problem: Health Promotion or Disease Self-Management (General Plan of Care)  Long-Range Goal: Chronic Disease Management and Care Coordination Needs   Start Date: 01/12/2023  Expected End Date: 04/12/2023  Priority: High  Note:   Current Barriers:  Knowledge Deficits related to plan of care for management of HTN, asthma, COPD, LBP with left sided sciatica, HLD, depression Care Coordination needs related to housing resources Chronic Disease Management support and education needs related to HTN, asthma, COPD, LBP with left sided sciatica, HLD, depression 02/15/23:  patient has received eye and dental resources-has not scheduled yet.  Continues PULM REHAB and going  well-has PULM appt tomorrow.  Counseling every week continues.  Has emotional support animal now-a cat.  BP stable.  RNCM Clinical Goal(s):  verbalize understanding of plan for management of  HTN, asthma, COPD, LBP with left sided sciatica, HLD, depressionas evidenced by patient report. verbalize basic understanding of  HTN, asthma, COPD, LBP with left sided sciatica, HLD, depression  disease process and self health management plan as evidenced by patient report. take all medications exactly as prescribed and will call provider for medication related questions as evidenced by patient report. demonstrate understanding of rationale for each prescribed medication as evidenced by patient report. attend all scheduled medical appointments: as evidenced by patient report and EMR review. demonstrate ongoing  adherence to prescribed treatment plan for HTN, asthma, COPD, LBP with left sided sciatica, HLD, depression  as evidenced by patient report and EMR review. continue to work with RN Care Manager to address care management and care coordination needs related to HTN, asthma, COPD, LBP with left sided sciatica, HLD, depression  as evidenced by adherence to CM Team Scheduled appointments work with Child psychotherapist to address housing needs related to the management of  HTN, asthma, COPD, LBP with left sided sciatica, HLD, depressionas evidenced by review of EMR and patient or Child psychotherapist report through collaboration with Medical illustrator, provider, and care team.   Interventions: Evaluation of current treatment plan related to  self management and patient's adherence to plan as established by provider  Asthma: (Status:New goal.) Long Term Goal Discussed the importance of adequate rest and management of fatigue with Asthma Assessed social determinant of health barriers  01/12/23: patient currently attending PULM REHAB  COPD Interventions:  (Status:  New goal.) Long Term Goal Provided instruction about proper  use of medications used for management of COPD including inhalers Discussed the importance of adequate rest and management of fatigue with COPD Assessed social determinant of health barriers 01/12/23: patient currently attending PULM REHAB  Hyperlipidemia Interventions:  (Status:  New goal.) Long Term Goal Medication review performed; medication list updated in electronic medical record.  Provider established cholesterol goals reviewed Counseled on importance of regular laboratory monitoring as prescribed Reviewed importance of limiting foods high in cholesterol Assessed social determinant of health barriers   Hypertension Interventions:  (Status:  New goal.) Long Term Goal Last practice recorded BP readings:  BP Readings from Last 3 Encounters:  12/03/22 (!) 142/89  11/04/22 (!) 162/114  02/01/22                120/64   Most recent eGFR/CrCl:  Lab Results  Component Value Date   EGFR 51 (L) 07/23/2022    No components found for: "CRCL"  Evaluation of current treatment plan related to hypertension self management and patient's adherence to plan as established by provider Reviewed medications with patient and discussed importance of compliance Discussed plans with patient for ongoing care management follow up and provided patient with direct contact information for  care management team Advised patient, providing education and rationale, to monitor blood pressure daily and record, calling PCP for findings outside established parameters Reviewed scheduled/upcoming provider appointments including:  Assessed social determinant of health barriers  Patient Goals/Self-Care Activities: Take all medications as prescribed Attend all scheduled provider appointments Call pharmacy for medication refills 3-7 days in advance of running out of medications Perform all self care activities independently  Perform IADL's (shopping, preparing meals, housekeeping, managing finances) independently Call  provider office for new concerns or questions  Work with the social worker to address care coordination needs and will continue to work with the clinical team to address health care and disease management related needs 02/15/23:  patient to schedule eye and dental appts when able to.    Follow Up Plan:  The patient has been provided with contact information for the care management team and has been advised to call with any health related questions or concerns.  The care management team will reach out to the patient again over the next 30 business  days.

## 2023-02-16 ENCOUNTER — Encounter: Payer: Self-pay | Admitting: Pulmonary Disease

## 2023-02-16 ENCOUNTER — Ambulatory Visit: Payer: 59 | Admitting: Pulmonary Disease

## 2023-02-16 ENCOUNTER — Other Ambulatory Visit: Payer: Self-pay

## 2023-02-16 ENCOUNTER — Ambulatory Visit: Payer: Medicaid Other | Admitting: Pulmonary Disease

## 2023-02-16 VITALS — BP 118/78 | HR 71 | Temp 97.3°F | Ht 73.0 in | Wt 200.2 lb

## 2023-02-16 DIAGNOSIS — J439 Emphysema, unspecified: Secondary | ICD-10-CM

## 2023-02-16 DIAGNOSIS — J4489 Other specified chronic obstructive pulmonary disease: Secondary | ICD-10-CM | POA: Diagnosis not present

## 2023-02-16 MED ORDER — AEROCHAMBER MINI CHAMBER DEVI
0 refills | Status: AC
Start: 2023-02-16 — End: ?
  Filled 2023-02-16: qty 1, 30d supply, fill #0

## 2023-02-16 MED ORDER — BREZTRI AEROSPHERE 160-9-4.8 MCG/ACT IN AERO: 2.0000 | INHALATION_SPRAY | Freq: Two times a day (BID) | RESPIRATORY_TRACT | Status: DC

## 2023-02-16 MED ORDER — BREZTRI AEROSPHERE 160-9-4.8 MCG/ACT IN AERO
2.0000 | INHALATION_SPRAY | Freq: Two times a day (BID) | RESPIRATORY_TRACT | 3 refills | Status: DC
  Filled 2023-02-16: qty 10.7, 30d supply, fill #0
  Filled 2023-03-26 – 2023-03-31 (×2): qty 32.1, 90d supply, fill #0
  Filled 2023-03-31 (×2): qty 10.7, 30d supply, fill #0
  Filled 2023-04-01: qty 32.1, 90d supply, fill #0
  Filled 2023-04-08: qty 10.7, 30d supply, fill #0

## 2023-02-17 ENCOUNTER — Other Ambulatory Visit
Admission: RE | Admit: 2023-02-17 | Discharge: 2023-02-17 | Disposition: A | Discharge status: Home / Self Care | Payer: Self-pay | Attending: Pulmonary Disease | Admitting: Pulmonary Disease

## 2023-02-17 ENCOUNTER — Other Ambulatory Visit
Admission: RE | Admit: 2023-02-17 | Discharge: 2023-02-17 | Disposition: A | Discharge status: Home / Self Care | Payer: Medicaid Other | Source: Home / Self Care | Attending: Family Medicine | Admitting: Family Medicine

## 2023-02-17 ENCOUNTER — Ambulatory Visit: Payer: Medicaid Other | Admitting: Clinical

## 2023-02-17 ENCOUNTER — Encounter: Payer: Medicaid Other | Admitting: *Deleted

## 2023-02-17 DIAGNOSIS — J4489 Other specified chronic obstructive pulmonary disease: Secondary | ICD-10-CM | POA: Insufficient documentation

## 2023-02-17 DIAGNOSIS — J439 Emphysema, unspecified: Secondary | ICD-10-CM | POA: Insufficient documentation

## 2023-02-17 DIAGNOSIS — J449 Chronic obstructive pulmonary disease, unspecified: Secondary | ICD-10-CM

## 2023-02-17 DIAGNOSIS — Z125 Encounter for screening for malignant neoplasm of prostate: Secondary | ICD-10-CM | POA: Insufficient documentation

## 2023-02-17 DIAGNOSIS — F323 Major depressive disorder, single episode, severe with psychotic features: Secondary | ICD-10-CM

## 2023-02-17 DIAGNOSIS — I10 Essential (primary) hypertension: Secondary | ICD-10-CM | POA: Insufficient documentation

## 2023-02-17 DIAGNOSIS — R739 Hyperglycemia, unspecified: Secondary | ICD-10-CM | POA: Insufficient documentation

## 2023-02-17 LAB — COMPREHENSIVE METABOLIC PANEL
ALT: 20 U/L (ref 0–44)
AST: 24 U/L (ref 15–41)
Albumin: 4 g/dL (ref 3.5–5.0)
Alkaline Phosphatase: 72 U/L (ref 38–126)
Anion gap: 12 (ref 5–15)
BUN: 14 mg/dL (ref 6–20)
CO2: 26 mmol/L (ref 22–32)
Calcium: 9.2 mg/dL (ref 8.9–10.3)
Chloride: 97 mmol/L — ABNORMAL LOW (ref 98–111)
Creatinine, Ser: 1.16 mg/dL (ref 0.61–1.24)
GFR, Estimated: >60 mL/min (ref >60)
Glucose, Bld: 113 mg/dL — ABNORMAL HIGH (ref 70–99)
Potassium: 3.8 mmol/L (ref 3.5–5.1)
Sodium: 135 mmol/L (ref 135–145)
Total Bilirubin: 0.8 mg/dL (ref 0.0–1.2)
Total Protein: 7.5 g/dL (ref 6.5–8.1)

## 2023-02-17 LAB — CBC WITH DIFFERENTIAL/PLATELET
Abs Immature Granulocytes: 0.03 10*3/uL (ref 0.00–0.07)
Basophils Absolute: 0 10*3/uL (ref 0.0–0.1)
Basophils Relative: 1 %
Eosinophils Absolute: 0.2 10*3/uL (ref 0.0–0.5)
Eosinophils Relative: 3 %
HCT: 43.4 % (ref 39.0–52.0)
Hemoglobin: 14.9 g/dL (ref 13.0–17.0)
Immature Granulocytes: 1 %
Lymphocytes Relative: 34 %
Lymphs Abs: 2.2 10*3/uL (ref 0.7–4.0)
MCH: 33.6 pg (ref 26.0–34.0)
MCHC: 34.3 g/dL (ref 30.0–36.0)
MCV: 97.7 fL (ref 80.0–100.0)
Monocytes Absolute: 0.6 10*3/uL (ref 0.1–1.0)
Monocytes Relative: 10 %
Neutro Abs: 3.4 10*3/uL (ref 1.7–7.7)
Neutrophils Relative %: 51 %
Platelets: 194 10*3/uL (ref 150–400)
RBC: 4.44 MIL/uL (ref 4.22–5.81)
RDW: 12.7 % (ref 11.5–15.5)
WBC: 6.5 10*3/uL (ref 4.0–10.5)
nRBC: 0 % (ref 0.0–0.2)

## 2023-02-17 LAB — LIPID PANEL
Cholesterol: 160 mg/dL (ref 0–200)
HDL: 49 mg/dL (ref >40)
LDL Cholesterol: 88 mg/dL (ref 0–99)
Total CHOL/HDL Ratio: 3.3 {ratio}
Triglycerides: 117 mg/dL (ref <150)
VLDL: 23 mg/dL (ref 0–40)

## 2023-02-17 LAB — HEMOGLOBIN A1C
Hgb A1c MFr Bld: 6.1 % — ABNORMAL HIGH (ref 4.8–5.6)
Mean Plasma Glucose: 128.37 mg/dL

## 2023-02-17 NOTE — Progress Notes (Signed)
                Dezi Schaner, LCSW 

## 2023-02-17 NOTE — Progress Notes (Signed)
[facility] Counselor/Therapist Progress Note  Patient ID: Larry Maxwell, MRN: [id number],    Date: 02/17/2023  Time Spent: 10:34am - 10:55am : 21 minutes   Treatment Type: Individual Therapy  Reported Symptoms: none reported  Mental Status Exam: Appearance:  Neat and Well Groomed     Behavior: Appropriate  Motor: Normal  Speech/Language:  Clear and Coherent  Affect: Appropriate  Mood: normal  Thought process: normal  Thought content:   WNL  Sensory/Perceptual disturbances:   WNL  Orientation: oriented to person, place, and situation  Attention: Good  Concentration: Good  Memory: WNL  Fund of knowledge:  Good  Insight:   Good  Judgment:  Good  Impulse Control: Good   Risk Assessment: Danger to Self:  No Patient denied current suicidal ideation  Self-injurious Behavior: No Danger to Others: No Patient denied current homicidal ideation Duty to Warn:no Physical Aggression / Violence:No  Access to Firearms a concern: No  Gang Involvement:No   Subjective: Patient stated, "everything's been going alright". Patient stated, "the plan to get that place (house) fell through". Patient reported he plans to call the housing authority after today's session to discuss housing options. Patient stated, "I've been feeling pretty good emotionally". Patient stated, "I go out every day to a doctors appointment, I'm staying active, keeping my mind busy". Patient stated, "I'm not doing nothing right now because its too cold" in reference to activities. Patient reported next week is patient's last week of pulmonary rehab and then patient plans to start silver sneakers at the [facility] 5 days per day week. Patient stated, "I do a lot of walking as well, I walk about 45 minutes a day". Patient reported he is considering going bowling this weekend. Patient stated, "that's something to think about" in reference to participation in a bowling league. Patient reported he is interest in  playing corn hole and swimming.   Interventions: Cognitive Behavioral Therapy. Clinician conducted session via caregility video from clinician's office at [facility]. Patient provided verbal consent to proceed with telehealth session and is aware of limitations of telephone or video visits. Patient participated in session from patient's home. Reviewed events since last session. Assessed patient's mood since last session and current mood. Explored patient's current participation in enjoyable activities and explored opportunities to increase patient's participation in enjoyable activities and opportunities for socialization. Provided psycho education related to participation in enjoyable activities and mood.   Patient reported his phone's battery was losing charge and the session was abruptly ended due to loss of connection.    Collaboration of Care: Other not required at this time   Diagnosis:  Major depressive disorder, single episode, severe with psychotic features (HCC)     Plan: Patient is to utilize [employer] Therapy, thought re-framing, mindfulness, healthy communication, and coping strategies to decrease symptoms associated with their diagnosis. Frequency: weekly  Modality: individual      Long-term goal:   Reduce overall level, frequency, and intensity of the feelings of depression as evidenced by decreased isolation, depressed mood, lack of appetite, difficulty falling asleep and staying asleep, lack of energy, loss of interest, and difficulty focusing from 7 days/week to 0 to 1 days/week per patient report for at least 3 consecutive months. Target Date: 01/05/24  Progress: progressing    Short-term goal:  Identify triggers for depressive symptoms Target Date: 07/06/23  Progress: progressing    Develop and implement coping strategies to utilize in response to depressive symptoms Target Date: 07/06/23  Progress:  progressing    Increase participation in  enjoyable activities from 3 days per week to 5 day per week Target Date: 07/06/23  Progress: progressing    patient will verbalize positive statements regarding self and his ability to cope with life stressors Target Date: 07/06/23  Progress: established 01/05/23    Verbalize patient's thoughts and feelings to through effective communication strategies per patient's report Target Date: 07/06/23  Progress: progressing    Identify, challenge, and replace negative thought patterns and negative self talk that contribute to feelings of depression with positive thoughts, beliefs, and positive self talk per patient's report Target Date: 07/06/23  Progress: progressing                Doree Barthel, LCSW

## 2023-02-17 NOTE — Progress Notes (Signed)
Daily Session Note  Patient Details  Name: Larry Maxwell MRN: [id number] Date of Birth: October 03, 1965 Referring Provider:   Flowsheet Row Pulmonary Rehab from 11/11/2022 in [facility] Cardiac and Pulmonary Rehab  Referring Provider Janann Colonel       Encounter Date: 02/17/2023  Check In:  Session Check In - 02/17/23 0800       Check-In   Supervising physician immediately available to respond to emergencies See telemetry face sheet for immediately available ER MD    Location [facility]-Cardiac & Pulmonary Rehab    Staff Present Rory Percy, MS, Exercise Physiologist;Maxon Conetta BS, Exercise Physiologist;Jason Wallace Cullens RDN,LDN;Sully Dyment, RN, BSN, CCRP    Virtual Visit No    Medication changes reported     No    Fall or balance concerns reported    No    Warm-up and Cool-down Performed on first and last piece of equipment    Resistance Training Performed Yes    VAD Patient? No    PAD/SET Patient? No      Pain Assessment   Currently in Pain? No/denies                Social History   Tobacco Use  Smoking Status Former   Current packs/day: 0.00   Average packs/day: 1.5 packs/day for 20.0 years (30.0 ttl pk-yrs)   Types: Cigarettes   Start date: 12/19/2001   Quit date: 12/19/2021   Years since quitting: 1.1  Smokeless Tobacco Never    Goals Met:  Independence with exercise equipment Exercise tolerated well No report of concerns or symptoms today  Goals Unmet:  Not Applicable  Comments: Pt able to follow exercise prescription today without complaint.  Will continue to monitor for progression.    Dr. Bethann Punches is Medical Director for [facility] Cardiac Rehabilitation.  Dr. Vida Rigger is Medical Director for [facility].

## 2023-02-18 LAB — PSA (REFLEX TO FREE) (SERIAL): Prostate Specific Ag, Serum: 1 ng/mL (ref 0.0–4.0)

## 2023-02-18 NOTE — Progress Notes (Unsigned)
Synopsis: Referred in by Sherlyn Hay, DO   Subjective:   PATIENT ID: Larry Maxwell GENDER: male DOB: 14-Jan-1966, MRN: [id number]  Chief Complaint  Patient presents with   Follow-up    Wheezing and shortness of breath at night. Reports using Airsupra and Ventolin, reports moderate symptom control.     HPI Larry Maxwell is a 58 years old male patient with a past medical history of essential hypertension, HLD, MDD and sciatica presenting today to the pulmonary clinic for a follow up visit regarding his mixed restrictive obstructive lung disease.   He presented on 08/07 with complaints of shortness of breath for about a year now. Worse on exertion and uphill. Also worse when laying flat. It is associated with a productive cough of clear sputum for also about year. He does report some chest pain when he coughs a lot. He denies any chills or nightsweats. Denies any dysphagia or acid reflux. Also denies any stiff joints or rashes but does have chronic back pain with sciatica. He had PFTs done by his primary care provider that revealed mixed obstructive and restrictive lung disease with reduced DLCO. He has a cxr from 2006 on file that shows reticular opacities at the bases bilaterally.   In the interim he underwent a High Res CT chest on 09/28/2022 that showed Mild Centrilobular and paraseptal emphysema. Negative for ILD. ANA mildly positive but profile negative. Sjogren antibodies negative, scleroderma antibodies negative. Myomarker panel negative. Anti-CCP and rheumatoid factor negative.  Presented today as acute visit, with worsening shortness of breath and gasping for air. He called 3 days ago with similar complaints. Started him on prednisone 40mg  for 5 days and azithromycin. Slightly improving. He continues with Larry Maxwell and Larry Maxwell. Unfortunately continues to smoke.    FH: His Father passed away of lung cancer. His mother had breastcancer.   SH: Exsmoker quit 1 year ago smoked about 1  PPD for 42 years. He denies alcohol use. Denies any illicit drug use but smokes marijuana occasionally. Currently unemployed, worked in a wearhouse for about 15 years. He has 1 cat at home. No birds or dogs. No travel outside the [place].   ROS All systems were reviewed and are negative except for the above.  Objective:   Vitals:   10/14/22 0839  Weight: 187 lb 3.2 oz (84.9 kg)  Height: 6\' 1"  (1.854 m)     on RA BMI Readings from Last 3 Encounters:  10/14/22 24.70 kg/m  09/01/22 24.70 kg/m  08/25/22 24.67 kg/m   Wt Readings from Last 3 Encounters:  10/14/22 187 lb 3.2 oz (84.9 kg)  09/01/22 187 lb 3.2 oz (84.9 kg)  08/25/22 187 lb (84.8 kg)    Physical Exam GEN: NAD HEENT: Supple Neck, Reactive Pupils, EOMI  CVS: Normal S1, Normal S2, RRR, No murmurs or ES appreciated  Lungs: Decrease air entry at the bases with faint crackles. No wheezing appreciated.  Abdomen: Soft, non tender, non distended, + BS  Extremities: Warm and well perfused, No edema  Skin: No suspicious lesions appreciated  Psych: Normal Affect  Ancillary Information   CBC    Component Value Date/Time   WBC 7.4 09/01/2022 0950   RBC 4.90 09/01/2022 0950   HGB 16.5 09/01/2022 0950   HCT 46.3 09/01/2022 0950   PLT 206 09/01/2022 0950   MCV 94.5 09/01/2022 0950   MCH 33.7 09/01/2022 0950   MCHC 35.6 09/01/2022 0950   RDW 11.8 09/01/2022 0950   LYMPHSABS 2.3 09/01/2022 0950  MONOABS 0.5 09/01/2022 0950   EOSABS 0.2 09/01/2022 0950   BASOSABS 0.0 09/01/2022 0950    Imaging  CXR 2006: Chronic lung changes - no acute process.   High Res CT chest 09/03  Mild centrilobular and paraseptal emphysema. Negative for subpleural reticulation, traction bronchiectasis/bronchiolectasis, ground glass, architectural distortion or honeycombing. 4 mm nodular density in the peripheral left lower lobe (7/92). Probable subpleural lymph nodes along the left major fissure. No pleural fluid. Airway is unremarkable. Minimal  air trapping.  PFTs 08/24/2022: FVC 2.3 L 43% of predicted FEV1 1.19 L 28% of predicted FEV1 to FVC ratio 51 with lower limit of normal 67.  Total lung capacity was reduced at 4.59 L 60% of predicted.  DLCO was reduced at 50% of predicted.     Latest Ref Rng & Units 08/24/2022    3:28 PM  PFT Results  FVC-Pre L 2.34   FVC-Predicted Pre % 43   FVC-Post L 2.90   FVC-Predicted Post % 53   Pre FEV1/FVC % % 51   Post FEV1/FCV % % 61   FEV1-Pre L 1.19   FEV1-Predicted Pre % 28   FEV1-Post L 1.78   DLCO uncorrected ml/min/mmHg 15.57   DLCO UNC% % 50   DLVA Predicted % 90   TLC L 4.59   TLC % Predicted % 60   RV % Predicted % 97      Assessment & Plan:  Larry Maxwell is a 58 years old male patient with a past medical history of essential hypertension, HLD, MDD, tobacco use in the past and sciatica presenting today to the pulmonary clinic after a pulmonary function test showing restrictive lung disease.   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   ANA mildly positive but profile negative. Sjogren antibodies negative, scleroderma antibodies negative.  One of the concerns is that he is unable to afford his inhalers and more improtantly concern for inability to take strong deep breath for medication delivery is raised. Therefore we will proceed with switching to nebulizer treatment.   []  c/w Fluticasone-Umeclidinum-Vilanterol [Larry Maxwell Elipta] 100-62.5-25 1 puff daily for now until neb treatment received.  []  c/w Albuterol 2puffs Q6H  []  Prescribed Duonebs Q6H Sched once received will stop Larry Maxwell elipta.  []  c/w Prednisone 40mg  PO daily and call back in 48 hours to let us know how you feel. Might need to extend course.  []  Referral to pulmonary rehab []  Plan to repeat PFTs and CXR in 6 months to assess restrictive lung disease progression.    #Tobacco use disorder Quit a year ago. Counseled on importance of continuing with abstinence from smoking. []  Will discuss Enrolling in low dose CT chest for lung cancer screening during next visit.   RTC 3 months.   I spent 60 minutes caring for this patient today, including preparing to see the patient, obtaining a medical history , reviewing a separately obtained history, performing a medically appropriate examination and/or evaluation, counseling and educating the patient/family/caregiver, ordering medications, tests, or procedures, documenting clinical information in the electronic health record, and independently interpreting results (not separately reported/billed) and communicating results to the patient/family/caregiver  Janann Colonel, MD [facility] 10/14/2022 8:40 AM

## 2023-02-19 LAB — ALLERGEN PANEL (27) + IGE
Alternaria Alternata IgE: <0.1 kU/L
Aspergillus Fumigatus IgE: <0.1 kU/L
Bahia Grass IgE: <0.1 kU/L
Bermuda Grass IgE: <0.1 kU/L
Cat Dander IgE: <0.1 kU/L
Cedar, Mountain IgE: <0.1 kU/L
Cladosporium Herbarum IgE: <0.1 kU/L
Cocklebur IgE: <0.1 kU/L
Cockroach, American IgE: <0.1 kU/L
Common Silver Birch IgE: <0.1 kU/L
D Farinae IgE: <0.1 kU/L
D Pteronyssinus IgE: <0.1 kU/L
Dog Dander IgE: <0.1 kU/L
Elm, American IgE: <0.1 kU/L
Hickory, White IgE: <0.1 kU/L
IgE (Immunoglobulin E), Serum: 121 [IU]/mL (ref 6–495)
Johnson Grass IgE: <0.1 kU/L
Kentucky Bluegrass IgE: <0.1 kU/L
Maple/Box Elder IgE: <0.1 kU/L
Mucor Racemosus IgE: <0.1 kU/L
Oak, White IgE: <0.1 kU/L
Penicillium Chrysogen IgE: <0.1 kU/L
Pigweed, Rough IgE: <0.1 kU/L
Plantain, English IgE: <0.1 kU/L
Ragweed, Short IgE: <0.1 kU/L
Setomelanomma Rostrat: <0.1 kU/L
Timothy Grass IgE: <0.1 kU/L
White Mulberry IgE: <0.1 kU/L

## 2023-02-21 ENCOUNTER — Ambulatory Visit: Payer: Medicaid Other | Admitting: Clinical

## 2023-02-21 ENCOUNTER — Encounter: Payer: Medicaid Other | Admitting: *Deleted

## 2023-02-21 DIAGNOSIS — J4489 Other specified chronic obstructive pulmonary disease: Secondary | ICD-10-CM | POA: Diagnosis not present

## 2023-02-21 DIAGNOSIS — J449 Chronic obstructive pulmonary disease, unspecified: Secondary | ICD-10-CM

## 2023-02-21 NOTE — Progress Notes (Signed)
Daily Session Note  Patient Details  Name: Larry Maxwell MRN: [id number] Date of Birth: 1965-09-05 Referring Provider:   Flowsheet Row Pulmonary Rehab from 11/11/2022 in [facility] Cardiac and Pulmonary Rehab  Referring Provider Janann Colonel       Encounter Date: 02/21/2023  Check In:  Session Check In - 02/21/23 0751       Check-In   Supervising physician immediately available to respond to emergencies See telemetry face sheet for immediately available ER MD    Location [facility]-Cardiac & Pulmonary Rehab    Staff Present Tommye Standard BS, ACSM CEP, Exercise Physiologist;Joseph Hollace Kinnier;Cora Collum, RN, BSN, CCRP;Jason Wallace Cullens RDN,LDN    Virtual Visit No    Medication changes reported     No    Fall or balance concerns reported    No    Warm-up and Cool-down Performed on first and last piece of equipment    Resistance Training Performed Yes    VAD Patient? No    PAD/SET Patient? No      Pain Assessment   Currently in Pain? No/denies                Social History   Tobacco Use  Smoking Status Former   Current packs/day: 0.00   Average packs/day: 1.5 packs/day for 20.0 years (30.0 ttl pk-yrs)   Types: Cigarettes   Start date: 12/19/2001   Quit date: 12/19/2021   Years since quitting: 1.1  Smokeless Tobacco Never    Goals Met:  Proper associated with RPD/PD & O2 Sat Independence with exercise equipment Exercise tolerated well No report of concerns or symptoms today  Goals Unmet:  Not Applicable  Comments: Pt able to follow exercise prescription today without complaint.  Will continue to monitor for progression.    Dr. Bethann Punches is Medical Director for [facility] Cardiac Rehabilitation.  Dr. Vida Rigger is Medical Director for [facility] Pulmonary Rehabilitation.

## 2023-02-22 ENCOUNTER — Encounter: Payer: Self-pay | Admitting: Family Medicine

## 2023-02-22 ENCOUNTER — Encounter: Payer: Medicaid Other | Admitting: *Deleted

## 2023-02-22 DIAGNOSIS — J449 Chronic obstructive pulmonary disease, unspecified: Secondary | ICD-10-CM

## 2023-02-22 DIAGNOSIS — J4489 Other specified chronic obstructive pulmonary disease: Secondary | ICD-10-CM | POA: Diagnosis not present

## 2023-02-22 NOTE — Progress Notes (Signed)
Daily Session Note  Patient Details  Name: Larry Maxwell MRN: [id number] Date of Birth: 1965/12/19 Referring Provider:   Flowsheet Row Pulmonary Rehab from 11/11/2022 in [facility] Cardiac and Pulmonary Rehab  Referring Provider Janann Colonel       Encounter Date: 02/22/2023  Check In:  Session Check In - 02/22/23 0819       Check-In   Supervising physician immediately available to respond to emergencies See telemetry face sheet for immediately available ER MD    Location [facility]-Cardiac & Pulmonary Rehab    Staff Present Cora Collum, RN, BSN, CCRP;Maxon Conetta BS, Exercise Physiologist;Margaret Best, MS, Exercise Physiologist;Noah Tickle, BS, Exercise Physiologist    Virtual Visit No    Medication changes reported     No    Fall or balance concerns reported    No    Warm-up and Cool-down Performed on first and last piece of equipment    Resistance Training Performed Yes    VAD Patient? No    PAD/SET Patient? No      Pain Assessment   Currently in Pain? No/denies                Social History   Tobacco Use  Smoking Status Former   Current packs/day: 0.00   Average packs/day: 1.5 packs/day for 20.0 years (30.0 ttl pk-yrs)   Types: Cigarettes   Start date: 12/19/2001   Quit date: 12/19/2021   Years since quitting: 1.1  Smokeless Tobacco Never    Goals Met:  Proper associated with RPD/PD & O2 Sat Independence with exercise equipment Exercise tolerated well No report of concerns or symptoms today  Goals Unmet:  Not Applicable  Comments: Pt able to follow exercise prescription today without complaint.  Will continue to monitor for progression.    Dr. Bethann Punches is Medical Director for [facility] Cardiac Rehabilitation.  Dr. Vida Rigger is Medical Director for [facility] Pulmonary Rehabilitation.

## 2023-02-24 ENCOUNTER — Encounter: Payer: Medicaid Other | Admitting: *Deleted

## 2023-02-24 DIAGNOSIS — J449 Chronic obstructive pulmonary disease, unspecified: Secondary | ICD-10-CM

## 2023-02-24 DIAGNOSIS — J4489 Other specified chronic obstructive pulmonary disease: Secondary | ICD-10-CM | POA: Diagnosis not present

## 2023-02-24 NOTE — Progress Notes (Signed)
Daily Session Note  Patient Details  Name: Larry Maxwell MRN: [id number] Date of Birth: Feb 03, 1965 Referring Provider:   Flowsheet Row Pulmonary Rehab from 11/11/2022 in [facility] Cardiac and Pulmonary Rehab  Referring Provider Larry Maxwell       Encounter Date: 02/24/2023  Check In:  Session Check In - 02/24/23 0758       Check-In   Supervising physician immediately available to respond to emergencies See telemetry face sheet for immediately available ER MD    Location [facility]-Cardiac & Pulmonary Rehab    Staff Present Larry Collum, RN, BSN, CCRP;Larry Maxwell RCP,RRT,BSRT;Larry Maxwell BS, Exercise Physiologist    Virtual Visit No    Medication changes reported     No    Fall or balance concerns reported    No    Warm-up and Cool-down Performed on first and last piece of equipment    Resistance Training Performed Yes    VAD Patient? No    PAD/SET Patient? No      Pain Assessment   Currently in Pain? No/denies                Social History   Tobacco Use  Smoking Status Former   Current packs/day: 0.00   Average packs/day: 1.5 packs/day for 20.0 years (30.0 ttl pk-yrs)   Types: Cigarettes   Start date: 12/19/2001   Quit date: 12/19/2021   Years since quitting: 1.1  Smokeless Tobacco Never    Goals Met:  Independence with exercise equipment Exercise tolerated well No report of concerns or symptoms today  Goals Unmet:  Not Applicable  Comments:  Doc graduated today from  rehab with 36 sessions completed.  Details of the patient's exercise prescription and what He needs to do in order to continue the prescription and progress were discussed with patient.  Patient was given a copy of prescription and goals.  Patient verbalized understanding. Larry Maxwell plans to continue to exercise by joining the [facility].    Dr. Bethann Maxwell is Medical Director for [facility] Cardiac Rehabilitation.  Dr. Vida Maxwell is Medical Director for [facility] Pulmonary  Rehabilitation.

## 2023-02-24 NOTE — Progress Notes (Signed)
Pulmonary Individual Treatment Plan  Patient Details  Name: Larry Maxwell MRN: [id number] Date of Birth: 05-21-1965 Referring Provider:   Flowsheet Row Pulmonary Rehab from 11/11/2022 in [facility] Cardiac and Pulmonary Rehab  Referring Provider Jean-Pierre Assaker       Initial Encounter Date:  Flowsheet Row Pulmonary Rehab from 11/11/2022 in [facility] Cardiac and Pulmonary Rehab  Date 11/11/22       Visit Diagnosis: Chronic obstructive pulmonary disease, unspecified COPD type (HCC)  Patient's Home Medications on Admission:  Current Outpatient Medications:    albuterol (VENTOLIN HFA) 108 (90 Base) MCG/ACT inhaler, Inhale 2 puffs into the lungs every 6 (six) hours as needed for wheezing or shortness of breath., Disp: 18 g, Rfl: 1   amLODipine (NORVASC) 10 MG tablet, Take 1 tablet (10 mg total) by mouth daily for 30 doses., Disp: 30 tablet, Rfl: 2   atorvastatin (LIPITOR) 20 MG tablet, Take by mouth., Disp: , Rfl:    benzonatate (TESSALON) 100 MG capsule, Take 1 capsule (100 mg total) by mouth 2 (two) times daily as needed for cough., Disp: 20 capsule, Rfl: 0   Budeson-Glycopyrrol-Formoterol (BREZTRI AEROSPHERE) 160-9-4.8 MCG/ACT AERO, Inhale 2 puffs into the lungs in the morning and at bedtime. (Patient not taking: Reported on 02/16/2023), Disp: , Rfl:    Budeson-Glycopyrrol-Formoterol (BREZTRI AEROSPHERE) 160-9-4.8 MCG/ACT AERO, Inhale 2 puffs into the lungs in the morning and at bedtime., Disp: 32.1 g, Rfl: 3   Budeson-Glycopyrrol-Formoterol (BREZTRI AEROSPHERE) 160-9-4.8 MCG/ACT AERO, Inhale 2 puffs into the lungs in the morning and at bedtime., Disp: 11.8 g, Rfl:    celecoxib (CELEBREX) 100 MG capsule, Take 1 capsule (100 mg total) by mouth 2 (two) times daily., Disp: 60 capsule, Rfl: 2   cetirizine (ZYRTEC) 10 MG tablet, Take 1 tablet (10 mg total) by mouth daily., Disp: 30 tablet, Rfl: 11   chlorthalidone (HYGROTON) 25 MG tablet, Take 1 tablet (25 mg total) by mouth daily., Disp: 90  tablet, Rfl: 0   DULoxetine (CYMBALTA) 60 MG capsule, Take 1 tablet by mouth daily., Disp: , Rfl:    gabapentin (NEURONTIN) 300 MG capsule, Take 1 capsule (300 mg total) by mouth in the morning AND 1 capsule (300 mg total) daily AND 2 capsules (600 mg total) at bedtime., Disp: 120 capsule, Rfl: 0   ipratropium-albuterol (DUONEB) 0.5-2.5 (3) MG/3ML SOLN, Take 3 mLs by nebulization every 6 (six) hours as needed., Disp: 360 mL, Rfl: 3   loratadine (CLARITIN) 10 MG tablet, Take by mouth., Disp: , Rfl:    methocarbamol (ROBAXIN) 500 MG tablet, Take 1 tablet (500 mg total) by mouth every 6 (six) hours as needed for muscle spasms., Disp: 20 tablet, Rfl: 0   montelukast (SINGULAIR) 10 MG tablet, Take 1 tablet (10 mg total) by mouth at bedtime., Disp: 30 tablet, Rfl: 0   naloxone (NARCAN) nasal spray 4 mg/0.1 mL, Place into the nose., Disp: , Rfl:    omeprazole (PRILOSEC) 20 MG capsule, Take 1 capsule (20 mg total) by mouth daily., Disp: 30 capsule, Rfl: 3   rosuvastatin (CRESTOR) 10 MG tablet, Take 1 tablet (10 mg total) by mouth daily., Disp: 90 tablet, Rfl: 3   sertraline (ZOLOFT) 50 MG tablet, Take 1 tablet (50 mg total) by mouth daily., Disp: 30 tablet, Rfl: 3   Spacer/Aero-Holding Chambers (AEROCHAMBER MINI CHAMBER) DEVI, Use as directed, Disp: 1 each, Rfl: 0   valsartan (DIOVAN) 80 MG tablet, Take 1 tablet (80 mg total) by mouth daily., Disp: 90 tablet, Rfl: 3  Past  Medical History: Past Medical History:  Diagnosis Date   Allergy    Asthma    COPD (chronic obstructive pulmonary disease) (HCC)    Depression    Gynecomastia, male 07/21/2022   Hypertension     Tobacco Use: Social History   Tobacco Use  Smoking Status Former   Current packs/day: 0.00   Average packs/day: 1.5 packs/day for 20.0 years (30.0 ttl pk-yrs)   Types: Cigarettes   Start date: 12/19/2001   Quit date: 12/19/2021   Years since quitting: 1.1  Smokeless Tobacco Never    Labs: Review Flowsheet       Latest Ref  Rng & Units 07/23/2022 02/17/2023  Labs for ITP Cardiac and Pulmonary Rehab  Cholestrol 0 - 200 mg/dL 161  096   LDL (calc) 0 - 99 mg/dL 045  88   HDL-C >40 mg/dL 47  49   Trlycerides <981 mg/dL 191  478   Hemoglobin G9F 4.8 - 5.6 % - 6.1      Pulmonary Assessment Scores:  Pulmonary Assessment Scores     Row Name 11/11/22 1325 02/15/23 0736       ADL UCSD   ADL Phase Entry Exit    SOB Score total 53 51    Rest 4 3    Walk 3 3    Stairs 3 4    Bath 4 2    Dress 2 1    Shop 0 0      CAT Score   CAT Score 31 23      mMRC Score   mMRC Score 2 1             UCSD: Self-administered rating of dyspnea associated with activities of daily living (ADLs) 6-point scale (0 = "not at all" to 5 = "maximal or unable to do because of breathlessness")  Scoring Scores range from 0 to 120.  Minimally important difference is 5 units  CAT: CAT can identify the health impairment of COPD patients and is better correlated with disease progression.  CAT has a scoring range of zero to 40. The CAT score is classified into four groups of low (less than 10), medium (10 - 20), high (21-30) and very high (31-40) based on the impact level of disease on health status. A CAT score over 10 suggests significant symptoms.  A worsening CAT score could be explained by an exacerbation, poor medication adherence, poor inhaler technique, or progression of COPD or comorbid conditions.  CAT MCID is 2 points  mMRC: mMRC (Modified Medical Research Council) Dyspnea Scale is used to assess the degree of baseline functional disability in patients of respiratory disease due to dyspnea. No minimal important difference is established. A decrease in score of 1 point or greater is considered a positive change.   Pulmonary Function Assessment:  Pulmonary Function Assessment - 11/08/22 0938       Breath   Shortness of Breath Yes             Exercise Target Goals: Exercise Program Goal: Individual exercise  prescription set using results from initial 6 min walk test and THRR while considering  patient's activity barriers and safety.   Exercise Prescription Goal: Initial exercise prescription builds to 30-45 minutes a day of aerobic activity, 2-3 days per week.  Home exercise guidelines will be given to patient during program as part of exercise prescription that the participant will acknowledge.  Education: Aerobic Exercise: - Group verbal and visual presentation on the components of exercise  prescription. Introduces F.I.T.T principle from ACSM for exercise prescriptions.  Reviews F.I.T.T. principles of aerobic exercise including progression. Written material given at graduation. Flowsheet Row Pulmonary Rehab from 02/03/2023 in [facility] Cardiac and Pulmonary Rehab  Date 02/03/23  Educator [facility]  Instruction Review Code 1- [employer] Understanding       Education: Resistance Exercise: - Group verbal and visual presentation on the components of exercise prescription. Introduces F.I.T.T principle from ACSM for exercise prescriptions  Reviews F.I.T.T. principles of resistance exercise including progression. Written material given at graduation. Flowsheet Row Pulmonary Rehab from 02/03/2023 in [facility] Cardiac and Pulmonary Rehab  Date 01/27/23  Educator NT  Instruction Review Code 1- Verbalizes Understanding        Education: Exercise & Equipment Safety: - Individual verbal instruction and demonstration of equipment use and safety with use of the equipment. Flowsheet Row Pulmonary Rehab from 02/03/2023 in [facility] Cardiac and Pulmonary Rehab  Date 11/11/22  Educator NT  Instruction Review Code 1- Verbalizes Understanding       Education: Exercise Physiology & General Exercise Guidelines: - Group verbal and written instruction with models to review the exercise physiology of the cardiovascular system and associated critical values. Provides general exercise guidelines with specific guidelines to those with  heart or lung disease.  Flowsheet Row Pulmonary Rehab from 02/03/2023 in [facility] Cardiac and Pulmonary Rehab  Date 01/20/23  Educator [facility]  Instruction Review Code 1- [employer] Understanding       Education: Flexibility, Balance, Mind/Body Relaxation: - Group verbal and visual presentation with interactive activity on the components of exercise prescription. Introduces F.I.T.T principle from ACSM for exercise prescriptions. Reviews F.I.T.T. principles of flexibility and balance exercise training including progression. Also discusses the mind body connection.  Reviews various relaxation techniques to help reduce and manage stress (i.e. Deep breathing, progressive muscle relaxation, and visualization). Balance handout provided to take home. Written material given at graduation. Flowsheet Row Pulmonary Rehab from 02/03/2023 in [facility] Cardiac and Pulmonary Rehab  Date 01/27/23  Educator NT  Instruction Review Code 1- Verbalizes Understanding       Activity Barriers & Risk Stratification:  Activity Barriers & Cardiac Risk Stratification - 11/11/22 1332       Activity Barriers & Cardiac Risk Stratification   Activity Barriers Back Problems;Shortness of Breath;Other (comment)    Comments Sciatica L nerve             6 Minute Walk:  6 Minute Walk     Row Name 11/11/22 1329 02/10/23 0750       6 Minute Walk   Phase Initial Discharge    Distance 1030 feet 1315 feet    Distance % Change -- 27.7 %    Distance Feet Change -- 285 ft    Walk Time 6 minutes 6 minutes    # of Rest Breaks 0 0    MPH 1.95 2.49    METS 3.65 3.71    RPE 12 11    Perceived Dyspnea  2 0    VO2 Peak 12.78 12.97    Symptoms Yes (comment) No    Comments cough, chest toghtness/pain 7/10 --    Resting HR 78 bpm 68 bpm    Resting BP 124/78 114/58    Resting Oxygen Saturation  98 % 98 %    Exercise Oxygen Saturation  during 6 min walk 94 % 93 %    Max Ex. HR 107 bpm 90 bpm    Max Ex. BP 148/84 124/66    2 Minute  Post BP 128/76 118/62      Interval HR   1 Minute HR 107 83    2 Minute HR 100 88    3 Minute HR 103 90    4 Minute HR 99 88    5 Minute HR 103 89    6 Minute HR 96 82    2 Minute Post HR 87 64    Interval Heart Rate? Yes Yes      Interval Oxygen   Interval Oxygen? Yes Yes    Baseline Oxygen Saturation % 98 % 98 %    1 Minute Oxygen Saturation % 98 % 98 %    1 Minute Liters of Oxygen 0 L  RA 0 L  RA    2 Minute Oxygen Saturation % 99 % 98 %    2 Minute Liters of Oxygen 0 L 0 L    3 Minute Oxygen Saturation % 98 % 96 %    3 Minute Liters of Oxygen 0 L 0 L    4 Minute Oxygen Saturation % 94 % 96 %    4 Minute Liters of Oxygen 0 L 0 L    5 Minute Oxygen Saturation % 97 % 96 %    5 Minute Liters of Oxygen 0 L 0 L    6 Minute Oxygen Saturation % 98 % 93 %    6 Minute Liters of Oxygen 0 L 0 L    2 Minute Post Oxygen Saturation % 99 % 96 %    2 Minute Post Liters of Oxygen 0 L 0 L            Oxygen Initial Assessment:  Oxygen Initial Assessment - 11/08/22 0938       Home Oxygen   Home Oxygen Device None    Sleep Oxygen Prescription None    Home Exercise Oxygen Prescription None    Home Resting Oxygen Prescription None      Initial 6 min Walk   Oxygen Used None      Program Oxygen Prescription   Program Oxygen Prescription None      Intervention   Short Term Goals To learn and understand importance of maintaining oxygen saturations>88%;To learn and understand importance of monitoring SPO2 with pulse oximeter and demonstrate accurate use of the pulse oximeter.;To learn and demonstrate proper pursed lip breathing techniques or other breathing techniques. ;To learn and demonstrate proper use of respiratory medications    Long  Term Goals Verbalizes importance of monitoring SPO2 with pulse oximeter and return demonstration;Maintenance of O2 saturations>88%;Exhibits proper breathing techniques, such as pursed lip breathing or other method taught during program  session;Compliance with respiratory medication;Demonstrates proper use of MDI's             Oxygen Re-Evaluation:  Oxygen Re-Evaluation     Row Name 11/16/22 0832 12/02/22 0738 01/06/23 0757 01/20/23 0804 01/20/23 0810     Program Oxygen Prescription   Program Oxygen Prescription None None None None --     Home Oxygen   Home Oxygen Device None None None None --   Sleep Oxygen Prescription None None None None --   Home Exercise Oxygen Prescription None None None None --   Home Resting Oxygen Prescription None None None None --   Compliance with Home Oxygen Use Yes -- -- -- --     Goals/Expected Outcomes   Short Term Goals To learn and demonstrate proper pursed lip breathing techniques or other breathing techniques.  To learn and  demonstrate proper pursed lip breathing techniques or other breathing techniques.  To learn and understand importance of maintaining oxygen saturations>88% To learn and demonstrate proper use of respiratory medications --   Long  Term Goals Exhibits proper breathing techniques, such as pursed lip breathing or other method taught during program session Exhibits proper breathing techniques, such as pursed lip breathing or other method taught during program session Maintenance of O2 saturations>88% Compliance with respiratory medication;Demonstrates proper use of MDI's --   Comments Reviewed PLB technique with pt.  Talked about how it works and it's importance in maintaining their exercise saturations. Informed patient how to perform the Pursed Lipped breathing technique. Told patient to Inhale through the nose and out the mouth with pursed lips to keep their airways open, help oxygenate them better, practice when at rest or doing strenuous activity. Patient Verbalizes understanding of technique and will work on and be reiterated during [facility]. He does not have a pulse oximeter to check his oxygen saturation at home. Informed him where to get one and explained why it  is important to have one. Reviewed that oxygen saturations should be 88 percent and above. Larry Maxwell continues to manage his  inhalers and nebulizer every day. He carries his rescue inhaler with him every where he goes. He states that his breathing has been better and he works to [employer] his meds every day to help withhis breathing. Leeman continues to manage his  inhalers and nebulizer every day. He carries his rescue inhaler with him every where he goes. He states that his breathing has been better and he works to [employer] his meds every day to help withhis breathing.  He has not had a flare for over 3 weeks.   Breathing is good with ADl's   Goals/Expected Outcomes Short: Become more profiecient at using PLB. Long: Become independent at using PLB. Short: use PLB with exertion. Long: use PLB on exertion proficiently and independently. Short: monitor oxygen at home with exertion. Long: maintain oxygen saturations above 88 percent independently. STG continue with med use as prescribed. always have rescue inhaler with him. LTG Medication management continues --    Row Name 02/15/23 0753             Program Oxygen Prescription   Program Oxygen Prescription None         Home Oxygen   Home Oxygen Device None       Sleep Oxygen Prescription None       Home Exercise Oxygen Prescription None       Home Resting Oxygen Prescription None       Compliance with Home Oxygen Use Yes         Goals/Expected Outcomes   Short Term Goals To learn and demonstrate proper use of respiratory medications       Long  Term Goals Compliance with respiratory medication;Demonstrates proper use of MDI's       Comments Larry Maxwell continues to manage his  inhalers and nebulizer every day. He carries his rescue inhaler with him every where he goes. He states that his breathing has been better and he works to [employer] his meds every day to help with his breathing. His last flare up was last week but he says that he was able to combat his  symptoms with pursed lip breathing.       Goals/Expected Outcomes STG continue with med use as prescribed. always have rescue inhaler with him. LTG Medication management continues  Oxygen Discharge (Final Oxygen Re-Evaluation):  Oxygen Re-Evaluation - 02/15/23 0753       Program Oxygen Prescription   Program Oxygen Prescription None      Home Oxygen   Home Oxygen Device None    Sleep Oxygen Prescription None    Home Exercise Oxygen Prescription None    Home Resting Oxygen Prescription None    Compliance with Home Oxygen Use Yes      Goals/Expected Outcomes   Short Term Goals To learn and demonstrate proper use of respiratory medications    Long  Term Goals Compliance with respiratory medication;Demonstrates proper use of MDI's    Comments Larry Maxwell continues to manage his  inhalers and nebulizer every day. He carries his rescue inhaler with him every where he goes. He states that his breathing has been better and he works to [employer] his meds every day to help with his breathing. His last flare up was last week but he says that he was able to combat his symptoms with pursed lip breathing.    Goals/Expected Outcomes STG continue with med use as prescribed. always have rescue inhaler with him. LTG Medication management continues             Initial Exercise Prescription:  Initial Exercise Prescription - 11/11/22 1300       Date of Initial Exercise RX and Referring Provider   Date 11/11/22    Referring Provider [place] Assaker      Oxygen   Maintain Oxygen Saturation 88% or higher      Treadmill   MPH 1.8    Grade 0.5    Minutes 15    METs 2.5      NuStep   Level 2   T6 nustep   SPM 80    Minutes 15    METs 3.65      REL-XR   Level 3    Speed 50    Minutes 15    METs 3.65      Prescription Details   Frequency (times per week) 2    Duration Progress to 30 minutes of continuous aerobic without signs/symptoms of physical distress       Intensity   THRR 40-80% of Max Heartrate 112-146    Ratings of Perceived Exertion 11-13    Perceived Dyspnea 0-4      Progression   Progression Continue to progress workloads to maintain intensity without signs/symptoms of physical distress.      Resistance Training   Training Prescription Yes    Weight 4 lb    Reps 10-15             Perform Capillary Blood Glucose checks as needed.  Exercise Prescription Changes:   Exercise Prescription Changes     Row Name 11/11/22 1300 12/02/22 1600 12/16/22 0900 12/28/22 0900 12/29/22 1000     Response to Exercise   Blood Pressure (Admit) 124/78 134/74 138/70 -- 122/68   Blood Pressure (Exercise) 148/84 178/100 146/84 -- 130/70   Blood Pressure (Exit) 128/76 142/90 136/74 -- 118/92   Heart Rate (Admit) 78 bpm 61 bpm 70 bpm -- 77 bpm   Heart Rate (Exercise) 107 bpm 110 bpm 105 bpm -- 110 bpm   Heart Rate (Exit) 87 bpm 63 bpm 98 bpm -- 96 bpm   Oxygen Saturation (Admit) 98 % 97 % 98 % -- --   Oxygen Saturation (Exercise) 94 % 96 % 95 % -- --   Oxygen Saturation (Exit) 99 % 98 % 99 % -- --  Rating of Perceived Exertion (Exercise) 12 15 15  -- 14   Perceived Dyspnea (Exercise) 2 2 3  -- 0   Symptoms cogh, chest tightness 7/10 none none -- none   Comments Results -- -- -- --   Duration -- Progress to 30 minutes of  aerobic without signs/symptoms of physical distress Progress to 30 minutes of  aerobic without signs/symptoms of physical distress -- Progress to 30 minutes of  aerobic without signs/symptoms of physical distress   Intensity -- THRR unchanged THRR unchanged -- THRR unchanged     Progression   Progression -- Continue to progress workloads to maintain intensity without signs/symptoms of physical distress. Continue to progress workloads to maintain intensity without signs/symptoms of physical distress. -- Continue to progress workloads to maintain intensity without signs/symptoms of physical distress.   Average METs -- 1.9  2.75 -- 3.6     Resistance Training   Training Prescription -- Yes Yes -- Yes   Weight -- 4 lb 4 lb -- 4 lb   Reps -- 10-15 10-15 -- 10-15     Interval Training   Interval Training -- No No -- No     Treadmill   MPH -- 1.5 2.5 -- 3   Grade -- 0.5 0 -- 1.5   Minutes -- 15 15 -- 15   METs -- 2.25 2.91 -- 3.92     NuStep   Level -- 1  T6 3  T6 -- 1   Minutes -- 15 15 -- 15   METs -- 1.6 2.1 -- 2.7     REL-XR   Level -- 1 2 -- 3   Minutes -- 15 15 -- 15   METs -- -- 3.1 -- 4.4     T5 Nustep   Level -- -- -- -- 3   Minutes -- -- -- -- 15     Home Exercise Plan   Plans to continue exercise at -- -- -- Home (comment)  Larry Maxwell plans to walk everyday of the week but states that he would walk for longer than normal on days that he doesnt come to rehab (30-45 min) Home (comment)  Larry Maxwell plans to walk everyday of the week but states that he would walk for longer than normal on days that he doesnt come to rehab (30-45 min)   Frequency -- -- -- Add 3 additional days to program exercise sessions. Add 3 additional days to program exercise sessions.   Initial Home Exercises Provided -- -- -- 12/28/22 12/28/22     Oxygen   Maintain Oxygen Saturation -- 88% or higher 88% or higher -- 88% or higher    Row Name 01/12/23 1300 01/27/23 1200 02/10/23 0800 02/21/23 1500       Response to Exercise   Blood Pressure (Admit) 130/70 120/60 116/68 120/62    Blood Pressure (Exercise) -- -- -- 124/66  post    Blood Pressure (Exit) 128/64 118/62 100/70 122/64    Heart Rate (Admit) 60 bpm 74 bpm 68 bpm 67 bpm    Heart Rate (Exercise) 115 bpm 98 bpm 111 bpm 101 bpm    Heart Rate (Exit) 87 bpm 95 bpm 75 bpm 94 bpm    Oxygen Saturation (Admit) 94 % 93 % 94 % 98 %    Oxygen Saturation (Exercise) 93 % 90 % 91 % 92 %    Oxygen Saturation (Exit) 97 % 98 % 96 % 96 %    Rating of Perceived Exertion (Exercise) 13 13 12  13    Perceived Dyspnea (Exercise) 1 1 1  0    Symptoms none none none none    Duration  Progress to 30 minutes of  aerobic without signs/symptoms of physical distress Progress to 30 minutes of  aerobic without signs/symptoms of physical distress Continue with 30 min of aerobic exercise without signs/symptoms of physical distress. Continue with 30 min of aerobic exercise without signs/symptoms of physical distress.    Intensity THRR unchanged THRR unchanged THRR unchanged THRR unchanged      Progression   Progression Continue to progress workloads to maintain intensity without signs/symptoms of physical distress. Continue to progress workloads to maintain intensity without signs/symptoms of physical distress. Continue to progress workloads to maintain intensity without signs/symptoms of physical distress. Continue to progress workloads to maintain intensity without signs/symptoms of physical distress.    Average METs 3.4 3.9 3.65 3.78      Resistance Training   Training Prescription Yes Yes Yes Yes    Weight 4 lb 4 lb 10 lb 10 lb    Reps 10-15 10-15 10-15 10-15      Interval Training   Interval Training No No No No      Treadmill   MPH 3.4 3.5 3.4 3.3    Grade 0 0 1 0.5    Minutes 15 15 15 15     METs 3.6 3.68 4.07 3.75      NuStep   Level 4 -- 5 5  T6    Minutes 15 -- 15 15    METs 3.3 -- 3.6 3.5      REL-XR   Level 4 4 5 8     Minutes 15 15 15 15     METs 6.1 6.3 5 4.7      T5 Nustep   Level 3 3  T6 -- --    Minutes 15 15 -- --    METs 2.4 3.4 -- --      Biostep-RELP   Level -- 3 -- --    Minutes -- 15 -- --    METs -- 4 -- --      Home Exercise Plan   Plans to continue exercise at Home (comment)  Larry Maxwell plans to walk everyday of the week but states that he would walk for longer than normal on days that he doesnt come to rehab (30-45 min) Home (comment)  Larry Maxwell plans to walk everyday of the week but states that he would walk for longer than normal on days that he doesnt come to rehab (30-45 min) Home (comment)  Larry Maxwell plans to walk everyday of the week but states that  he would walk for longer than normal on days that he doesnt come to rehab (30-45 min) Home (comment)  Larry Maxwell plans to walk everyday of the week but states that he would walk for longer than normal on days that he doesnt come to rehab (30-45 min)    Frequency Add 3 additional days to program exercise sessions. Add 3 additional days to program exercise sessions. Add 3 additional days to program exercise sessions. Add 3 additional days to program exercise sessions.    Initial Home Exercises Provided 12/28/22 12/28/22 12/28/22 12/28/22      Oxygen   Maintain Oxygen Saturation 88% or higher -- 88% or higher 88% or higher             Exercise Comments:   Exercise Comments     Row Name 11/16/22 0831 02/24/23 0759  Exercise Comments First full day of exercise!  Patient was oriented to gym and equipment including functions, settings, policies, and procedures.  Patient's individual exercise prescription and treatment plan were reviewed.  All starting workloads were established based on the results of the 6 minute walk test done at initial orientation visit.  The plan for exercise progression was also introduced and progression will be customized based on patient's performance and goals. Larry Maxwell graduated today from  rehab with 36 sessions completed.  Details of the patient's exercise prescription and what He needs to do in order to continue the prescription and progress were discussed with patient.  Patient was given a copy of prescription and goals.  Patient verbalized understanding. Larry Maxwell plans to continue to exercise by joining the [facility].               Exercise Goals and Review:   Exercise Goals     Row Name 11/11/22 1332             Exercise Goals   Increase Physical Activity Yes       Intervention Provide advice, education, support and counseling about physical activity/exercise needs.;Develop an individualized exercise prescription for aerobic and resistive training based on  initial evaluation findings, risk stratification, comorbidities and participant's personal goals.       Expected Outcomes Short Term: Attend rehab on a regular basis to increase amount of physical activity.;Long Term: Add in home exercise to make exercise part of routine and to increase amount of physical activity.;Long Term: Exercising regularly at least 3-5 days a week.       Increase Strength and Stamina Yes       Intervention Provide advice, education, support and counseling about physical activity/exercise needs.;Develop an individualized exercise prescription for aerobic and resistive training based on initial evaluation findings, risk stratification, comorbidities and participant's personal goals.       Expected Outcomes Short Term: Increase workloads from initial exercise prescription for resistance, speed, and METs.;Short Term: Perform resistance training exercises routinely during rehab and add in resistance training at home;Long Term: Improve cardiorespiratory fitness, muscular endurance and strength as measured by increased METs and functional capacity ( )       Able to understand and use rate of perceived exertion (RPE) scale Yes       Intervention Provide education and explanation on how to use RPE scale       Expected Outcomes Short Term: Able to use RPE daily in rehab to express subjective intensity level;Long Term:  Able to use RPE to guide intensity level when exercising independently       Able to understand and use Dyspnea scale Yes       Intervention Provide education and explanation on how to use Dyspnea scale       Expected Outcomes Short Term: Able to use Dyspnea scale daily in rehab to express subjective sense of shortness of breath during exertion;Long Term: Able to use Dyspnea scale to guide intensity level when exercising independently       Knowledge and understanding of Target Heart Rate Range (THRR) Yes       Intervention Provide education and explanation of THRR  including how the numbers were predicted and where they are located for reference       Expected Outcomes Short Term: Able to state/look up THRR;Long Term: Able to use THRR to govern intensity when exercising independently;Short Term: Able to use daily as guideline for intensity in rehab       Able  to check pulse independently Yes       Intervention Provide education and demonstration on how to check pulse in carotid and radial arteries.;Review the importance of being able to check your own pulse for safety during independent exercise       Expected Outcomes Short Term: Able to explain why pulse checking is important during independent exercise;Long Term: Able to check pulse independently and accurately       Understanding of Exercise Prescription Yes       Intervention Provide education, explanation, and written materials on patient's individual exercise prescription       Expected Outcomes Short Term: Able to explain program exercise prescription;Long Term: Able to explain home exercise prescription to exercise independently                Exercise Goals Re-Evaluation :  Exercise Goals Re-Evaluation     Row Name 11/16/22 0831 12/02/22 1628 12/16/22 0941 12/28/22 0927 12/29/22 1101     Exercise Goal Re-Evaluation   Exercise Goals Review -- Increase Physical Activity;Increase Strength and Stamina;Understanding of Exercise Prescription Increase Physical Activity;Increase Strength and Stamina;Understanding of Exercise Prescription Increase Physical Activity;Able to understand and use Dyspnea scale;Understanding of Exercise Prescription;Knowledge and understanding of Target Heart Rate Range (THRR);Increase Strength and Stamina;Able to check pulse independently;Able to understand and use rate of perceived exertion (RPE) scale Increase Physical Activity;Increase Strength and Stamina;Understanding of Exercise Prescription   Comments Reviewed RPE and dyspnea scale, THR and program prescription with pt  today.  Pt voiced understanding and was given a copy of goals to take home. Iban is off to a great start in the program. He has been getting familiar with his exercise prescription, and the machines involved. He was able to use the readmill at a speed of 1.5 mph and an incline of 0.5%, and use the T6 nustep and XR at level 1. We will continue to monitor his progress in the program. Kashus continues to do well in the program. He has been able to make improvements in his speed on the treadmill, going from a speed of 1.5 mph to 2.5 mph. He was also able to increase his level on the T6 nustep from level 1 to level 3. We will continue to monitor his progress in the program. Reviewed home exercise with pt today from 7:40 to 7:50.  Pt plans to walk outside at home for at least 30 minutes, 3 days per week for exercise.  Reviewed THR, pulse, RPE, sign and symptoms, pulse oximetery and when to call 911 or MD.  Also discussed weather considerations and indoor options.  Pt voiced understanding. Larry Maxwell continues to do well in rehab. He has been able to increase his level on the XR to level 3. He has also increased his workload on the treadmill from 2.5 to and 0 to 1.5% incline. We will continue to monitor his progress in the program.   Expected Outcomes Short: Use RPE daily to regulate intensity. Long: Follow program prescription in THR. Short: Continue to follow current exercise prescription. Long: Continue exercise to improve strength and stamina. Short: Continue to follow current exercise prescription. Long: Continue exercise to improve strength and stamina. Short: Continue to implement home exercise. Long: Continue exercising to improve strength and stamina. Short: Continue to progressively increase treadmill workload. Long: Continue exercise to improve strength and stamina.    Row Name 01/04/23 0805 01/12/23 1401 01/20/23 0809 01/27/23 1246 02/10/23 0843     Exercise Goal Re-Evaluation   Exercise Goals  Review  Understanding of Exercise Prescription;Able to check pulse independently;Knowledge and understanding of Target Heart Rate Range (THRR);Able to understand and use Dyspnea scale;Able to understand and use rate of perceived exertion (RPE) scale;Increase Strength and Stamina;Increase Physical Activity Increase Physical Activity;Increase Strength and Stamina;Understanding of Exercise Prescription Increase Physical Activity Increase Physical Activity;Increase Strength and Stamina;Understanding of Exercise Prescription Increase Physical Activity;Increase Strength and Stamina;Understanding of Exercise Prescription   Comments Larry Maxwell continues to walk for 45 minutes outside everyday. He states that he is seeing a lot of progress and continues to increase his workloads. Larry Maxwell continues to do well in rehab. He has recently increased his level on the T4 nustep from level 1 to 4. He also increased his speed on the treadmill from to 3.4 mph. We will continue to monitor his progress in the program. Larry Maxwell is managing his home exercise by walking 30 - 45 minutes daily.  He finds that this provides him 'Me time".    He is walking outside . He was advised to not be out with low temps, but to find an indoor walking track. Larry Maxwell is doing well in rehab. He was able to add the biostep to his exercise precription at level 3. He was not able to increase his workloads on any exercise machines, but he was able to maintain. We will continue to monitor his progress in the program. Larry Maxwell continues to do well in rehab. He was able to increase his workload on the treadmill by adding incline of 1% with a speed of 3.66mph. He also increased his workloads on the XR with level 5 and the T4 nustep with level 5. He increased his dumbells to 10 lb for resistance training. We will continue to monitor his progress in the program.   Expected Outcomes Short: Continue to exercise independently. Long: Continue exercise to improve his strength and stamina.  Short: Continue to progressively increase treadmill workload. Long: Continue exercise to improve strength and stamina. STG continue with the daily walks indoors or outdoors if weather permits.  LTG COntinued daily walks Short: Continue to progressively increase treadmill workload. Long: Continue exercise to improve strength and stamina. Short: Continue to progressively increase workloads on the treadmill, XR, and nustep. Long: Continue exercise to improve strength and stamina.    Row Name 02/21/23 1532             Exercise Goal Re-Evaluation   Exercise Goals Review Increase Physical Activity;Increase Strength and Stamina;Understanding of Exercise Prescription       Comments Larry Maxwell continues to do well in rehab and will graduate in a few sessions. He completed his post and walked 1331ft, which was an improvement of 27.7%. He also was able to increase to level 8 on the XR. We will continue to monitor his progress in the program.       Expected Outcomes Short: Graduate from the program. Long: Continue to exercise independently.                Discharge Exercise Prescription (Final Exercise Prescription Changes):  Exercise Prescription Changes - 02/21/23 1500       Response to Exercise   Blood Pressure (Admit) 120/62    Blood Pressure (Exercise) 124/66   post   Blood Pressure (Exit) 122/64    Heart Rate (Admit) 67 bpm    Heart Rate (Exercise) 101 bpm    Heart Rate (Exit) 94 bpm    Oxygen Saturation (Admit) 98 %    Oxygen Saturation (Exercise) 92 %  Oxygen Saturation (Exit) 96 %    Rating of Perceived Exertion (Exercise) 13    Perceived Dyspnea (Exercise) 0    Symptoms none    Duration Continue with 30 min of aerobic exercise without signs/symptoms of physical distress.    Intensity THRR unchanged      Progression   Progression Continue to progress workloads to maintain intensity without signs/symptoms of physical distress.    Average METs 3.78      Resistance Training    Training Prescription Yes    Weight 10 lb    Reps 10-15      Interval Training   Interval Training No      Treadmill   MPH 3.3    Grade 0.5    Minutes 15    METs 3.75      NuStep   Level 5   T6   Minutes 15    METs 3.5      REL-XR   Level 8    Minutes 15    METs 4.7      Home Exercise Plan   Plans to continue exercise at Home (comment)   Larry Maxwell plans to walk everyday of the week but states that he would walk for longer than normal on days that he doesnt come to rehab (30-45 min)   Frequency Add 3 additional days to program exercise sessions.    Initial Home Exercises Provided 12/28/22      Oxygen   Maintain Oxygen Saturation 88% or higher             Nutrition:  Target Goals: Understanding of nutrition guidelines, daily intake of sodium 1500mg , cholesterol 200mg , calories 30% from fat and 7% or less from saturated fats, daily to have 5 or more servings of fruits and vegetables.  Education: All About Nutrition: -Group instruction provided by verbal, written material, interactive activities, discussions, models, and posters to present general guidelines for heart healthy nutrition including fat, fiber, MyPlate, the role of sodium in heart healthy nutrition, utilization of the nutrition label, and utilization of this knowledge for meal planning. Follow up email sent as well. Written material given at graduation. Flowsheet Row Pulmonary Rehab from 02/03/2023 in [facility] Cardiac [facility]  Education need identified 11/11/22  Date 12/09/22  Educator JG  Instruction Review Code 1- Verbalizes Understanding       Biometrics:  Pre Biometrics - 11/11/22 1333       Pre Biometrics   Height 6\' 1"  (1.854 m)    Weight 190 lb 8 oz (86.4 kg)    Waist Circumference 40 inches    Hip Circumference 41 inches    Waist to Hip Ratio 0.98 %    BMI (Calculated) 25.14    Single Leg Stand 10.4 seconds             Post Biometrics - 02/10/23 0753        Post   Biometrics   Height 6\' 1"  (1.854 m)    Weight 200 lb 11.2 oz (91 kg)    Waist Circumference 44 inches    Hip Circumference 42 inches    Waist to Hip Ratio 1.05 %    BMI (Calculated) 26.48    Single Leg Stand 30 seconds             Nutrition Therapy Plan and Nutrition Goals:  Nutrition Therapy & Goals - 11/16/22 0947       Nutrition Therapy   Diet Mediterranean    Protein (specify units)  75-90    Fiber 30 grams    Whole Grain Foods 3 servings    Saturated Fats 15 max. grams    Fruits and Vegetables 5 servings/day    Sodium 2 grams      Personal Nutrition Goals   Nutrition Goal Eat something balanced at every meal hour    Personal Goal #2 Include healthy fats to help promote health and meet calorie needs    Personal Goal #3 Meet with RD again in late Nov    Comments Patient drinking 64oz of water daily. He wants to learn how to adopt a healthy eating style. Reviewed mediterranean diet handout with him, educating on types of fats, sources, and how to read labels. He is inconsistent with eating, sometimes overeating, or snacking on junk, sometimes missing meals. Explained the importance of consistency and structure. Built out several meals and snacks with foods he likes and will eat focusing on nutrient and calorie dense foods that could be eating in small quantities with little prep work to help him be more consistent at meeting nutritional needs even when his appetite is poor. Explained that this will require continued work, he has agreed to meet again in ~3month to reassess and work through any barriers that arise while making these changes.      Intervention Plan   Intervention Prescribe, educate and counsel regarding individualized specific dietary modifications aiming towards targeted core components such as weight, hypertension, lipid management, diabetes, heart failure and other comorbidities.;Nutrition handout(s) given to patient.    Expected Outcomes Short Term Goal:  Understand basic principles of dietary content, such as calories, fat, sodium, cholesterol and nutrients.;Short Term Goal: A plan has been developed with personal nutrition goals set during dietitian appointment.;Long Term Goal: Adherence to prescribed nutrition plan.             Nutrition Assessments:  MEDIFICTS Score Key: >=70 Need to make dietary changes  40-70 Heart Healthy Diet <= 40 Therapeutic Level Cholesterol Diet  Flowsheet Row Pulmonary Rehab from 02/15/2023 in [facility] Cardiac and Pulmonary Rehab  Picture Your Plate Total Score on Admission 41  Picture Your Plate Total Score on Discharge 63      Picture Your Plate Scores: <16 Unhealthy dietary pattern with much room for improvement. 41-50 Dietary pattern unlikely to meet recommendations for good health and room for improvement. 51-60 More healthful dietary pattern, with some room for improvement.  >60 Healthy dietary pattern, although there may be some specific behaviors that could be improved.   Nutrition Goals Re-Evaluation:  Nutrition Goals Re-Evaluation     Row Name 12/02/22 0745 01/04/23 0822 01/20/23 0820 01/31/23 0906 02/15/23 0743     Goals   Current Weight 186 lb (84.4 kg) 197 lb 6.4 oz (89.5 kg) -- -- 200 lb (90.7 kg)   Nutrition Goal -- Eat something balanced at every meal hour Eat something balanced at every meal hour -- --   Comment Patient was informed on why it is important to maintain a balanced diet when dealing with Respiratory issues. Explained that it takes a lot of energy to breath and when they are short of breath often they will need to have a good diet to help keep up with the calories they are expending for breathing. Patient was informed on why it is important to maintain a balanced diet when dealing with Respiratory issues. Explained that it takes a lot of energy to breath and when they are short of breath often they will need to have  a good diet to help keep up with the calories they are expending  for breathing. Goals 1,2 and 3       Laurier has not met again with RD, will send reminder to RD for the appt. Larry Maxwell is working on balancing his Larry Maxwell has not been able to figure out how to feel full,what foods would help. He does eat oatmeal, cream of wheat and couscous. He will add peanut butter to his oatmeal. He will buy the cut up fruit at the grocery store. he avoids adding salt and does not eat fried foods. Patient drinking adequate water, cut out juice and soda, now drinking more plain water. He reports he has done well with eating smaller meals with protein and carb paired together creating more consistency and structure in his eating routine. Says he has stopped eating fried chicken with so much oil and is now using oven fried or air fried to cut back on excessive oil intake. He is not salting his food at the table or when cooking. Reminded him that less than 1500mg  sodium is his daily target. Encouraged him to read labels of foods he eats regularly to ensure processed or salty foods don't make him overshoot the 1500mg  sodium goal often. He still reports struggling with overeating at some meals. Spoke with him about ways to try and buffer hunger before meals to practice more mindful eating. Encouraged drinking water before larger meals or including more veggies at meals or fresh fruit before eating. Larry Maxwell states that he wants to maintain his weight, and that he is happy with his current weight. He says that he is eating a lot of salads, stays away from fried foods, and continues to balance his diet.   Expected Outcome Short: Choose and plan snacks accordingly to patients caloric intake to improve breathing. Long: Maintain a diet independently that meets their caloric intake to aid in daily shortness of breath. Short: Choose and plan snacks accordingly to patients caloric intake to improve breathing. Long: Maintain a diet independently that meets their caloric intake to aid in daily shortness of breath. STG  get advice on how to balance his meals, what to add to help him feel full.  Meet with RD soon  LTG Larry Maxwell ahs his meal plans working on balanced meals/snacks -- Short: Continue to balance diet. Long: follow recommendations from dietician regarding nutrition plan.     Personal Goal #2 Re-Evaluation   Personal Goal #2 -- Include healthy fats to help promote health and meet calorie needs Include healthy fats to help promote health and meet calorie needs -- --     Personal Goal #3 Re-Evaluation   Personal Goal #3 -- Meet with RD again in late Nov Meet with RD again in late Nov -- --            Nutrition Goals Discharge (Final Nutrition Goals Re-Evaluation):  Nutrition Goals Re-Evaluation - 02/15/23 0743       Goals   Current Weight 200 lb (90.7 kg)    Comment Larry Maxwell states that he wants to maintain his weight, and that he is happy with his current weight. He says that he is eating a lot of salads, stays away from fried foods, and continues to balance his diet.    Expected Outcome Short: Continue to balance diet. Long: follow recommendations from dietician regarding nutrition plan.             Psychosocial: Target Goals: Acknowledge presence or absence of significant depression and/or  stress, maximize coping skills, provide positive support system. Participant is able to verbalize types and ability to use techniques and skills needed for reducing stress and depression.   Education: Stress, Anxiety, and Depression - Group verbal and visual presentation to define topics covered.  Reviews how body is impacted by stress, anxiety, and depression.  Also discusses healthy ways to reduce stress and to treat/manage anxiety and depression.  Written material given at graduation.   Education: Sleep Hygiene -Provides group verbal and written instruction about how sleep can affect your health.  Define sleep hygiene, discuss sleep cycles and impact of sleep habits. Review good sleep hygiene tips.     Initial Review & Psychosocial Screening:  Initial Psych Review & Screening - 11/08/22 0940       Initial Review   Current issues with Current Sleep Concerns;Current Anxiety/Panic      Family Dynamics   Good Support System? No    Strains Intra-family strains    Comments He states he does not have a great family support system. He preferes to deal with his stressors himself. At night he has trouble sleeping and gets shot of breath.      Barriers   Psychosocial barriers to participate in program The patient should benefit from training in stress management and relaxation.;There are no identifiable barriers or psychosocial needs.      Screening Interventions   Interventions Encouraged to exercise;To provide support and resources with identified psychosocial needs;Provide feedback about the scores to participant    Expected Outcomes Short Term goal: Utilizing psychosocial counselor, staff and physician to assist with identification of specific Stressors or current issues interfering with healing process. Setting desired goal for each stressor or current issue identified.;Long Term Goal: Stressors or current issues are controlled or eliminated.;Short Term goal: Identification and review with participant of any Quality of Life or Depression concerns found by scoring the questionnaire.;Long Term goal: The participant improves quality of Life and PHQ9 Scores as seen by post scores and/or verbalization of changes             Quality of Life Scores:  Scores of 19 and below usually indicate a poorer quality of life in these areas.  A difference of  2-3 points is a clinically meaningful difference.  A difference of 2-3 points in the total score of the Quality of Life Index has been associated with significant improvement in overall quality of life, self-image, physical symptoms, and general health in studies assessing change in quality of life.  PHQ-9: Review Flowsheet  More data exists       02/15/2023 02/02/2023 01/25/2023 01/04/2023 12/03/2022  Depression screen PHQ 2/9  Decreased Interest 1 1  1 2   Down, Depressed, Hopeless 2 1  1 2   PHQ - 2 Score 3 2  2 4   Altered sleeping 2 1  2 3   Tired, decreased energy 2 2  2 3   Change in appetite 1 1  1 2   Feeling bad or failure about yourself  2 0  1 2  Trouble concentrating 1 0  0 2  Moving slowly or fidgety/restless 1 0  0 1  Suicidal thoughts 0 0  0 1  PHQ-9 Score 12 6  8 18   Difficult doing work/chores Somewhat difficult Not difficult at all  Somewhat difficult -    Details       Information is confidential and restricted. Go to Review Flowsheets to unlock data.        Interpretation of Total  Score  Total Score Depression Severity:  1-4 = Minimal depression, 5-9 = Mild depression, 10-14 = Moderate depression, 15-19 = Moderately severe depression, 20-27 = Severe depression   Psychosocial Evaluation and Intervention:  Psychosocial Evaluation - 11/08/22 0945       Psychosocial Evaluation & Interventions   Interventions Encouraged to exercise with the program and follow exercise prescription;Relaxation education;Stress management education    Comments He states he does not have a great family support system. He preferes to deal with his stressors himself. At night he has trouble sleeping and gets shot of breath.    Expected Outcomes Short: Start [facility] to help with mood. Long: Maintain a healthy mental state.    Continue Psychosocial Services  Follow up required by staff             Psychosocial Re-Evaluation:  Psychosocial Re-Evaluation     Row Name 12/02/22 0748 01/04/23 0812 01/20/23 0813 02/15/23 0736       Psychosocial Re-Evaluation   Current issues with Current Stress Concerns Current Stress Concerns Current Stress Concerns;Current Sleep Concerns Current Stress Concerns;Current Sleep Concerns    Comments Reviewed patient health questionnaire (PHQ-9) with patient for follow up. Previously, patients score  indicated signs/symptoms of depression.  Reviewed to see if patient is improving symptom wise while in program.  Score improved and patient states that it is because he is taking care of his health more and seeing a therapist. Reviewed patient health questionnaire (PHQ-9) with patient for follow up. Previously, patients score indicated signs/symptoms of depression.  Reviewed to see if patient is improving symptom wise while in program.  Score improved and patient states that it is because he is taking care of his health more and seeing a therapist. Patient states that after his diagnosis with stage 4 lung cancer he dealt with a lot of depression. He states that his continuous exercise has helped him mentally, he also has a therapist that he sees once a week that helps him talk about his situation. Anes is doing well withhis sleep, he does have meds prescribed to help with sleep. He does not use the meds every night as he is fatigued from the day and can fall asleep easily and sleep through his night.  He does go to bed at 730 and has been waking up at 230 AM.  HIs health has been a stress for [place]. He is managing his stress with a psychiatrist and a Veterinary surgeon . He stsates that he is feeling better and wants to continue taking care of himself.  His concern is that he has appointments almost daily and he has no time for himself.  We talked about talking to his counselor and seeing if in the next month the appointments could be stretched out so he has days without appointments. He has no other support beyond himeself and his faith. Larry Maxwell says that he is now taking his sleeping medication every night. He states that he is now sleeping until 4:30, he is still going to bed around 7-7:30. He is still seeing his counselor and psychiatrist, he claims that his mental health continues to be good and stress levels low.    Expected Outcomes Short: Continue to attend [facility] regularly for regular exercise and social engagement.  Long: Continue to improve symptoms and manage a positive mental state. Short: Continue to attend [facility] regularly for regular exercise and social engagement. Long: Continue to improve symptoms and manage a positive mental state. STG Larry Maxwell will check if he  can have a day without appointments. He will continue to walk daily and work on his sleep cycle.  LTG Larry Maxwell does not need appointments so close together and he continues to manage his health and stress. Short: Continue to manage stress levels in his day-to-day life. Long: Continue to exericise in [place] program in order to manage stress levels.    Interventions Encouraged to attend [facility] for the exercise Encouraged to attend [facility] for the exercise Encouraged to attend [facility] for the exercise Encouraged to attend [facility] for the exercise    Continue Psychosocial Services  Follow up required by staff Follow up required by staff Follow up required by staff Follow up required by staff             Psychosocial Discharge (Final Psychosocial Re-Evaluation):  Psychosocial Re-Evaluation - 02/15/23 0736       Psychosocial Re-Evaluation   Current issues with Current Stress Concerns;Current Sleep Concerns    Comments Dravin says that he is now taking his sleeping medication every night. He states that he is now sleeping until 4:30, he is still going to bed around 7-7:30. He is still seeing his counselor and psychiatrist, he claims that his mental health continues to be good and stress levels low.    Expected Outcomes Short: Continue to manage stress levels in his day-to-day life. Long: Continue to [facility]ericise in [facility] in order to manage stress levels.    Interventions Encouraged to attend [facility] for the exercise    Continue Psychosocial Services  Follow up required by staff             Education: Education Goals: Education classes will be provided  on a weekly basis, covering required topics. Participant will state understanding/return demonstration of topics presented.  Learning Barriers/Preferences:  Learning Barriers/Preferences - 11/08/22 0939       Learning Barriers/Preferences   Learning Barriers None    Learning Preferences None             General Pulmonary Education Topics:  Infection Prevention: - Provides verbal and written material to individual with discussion of infection control including proper hand washing and proper equipment cleaning during exercise session. Flowsheet Row Pulmonary Rehab from 02/03/2023 in [facility] Cardiac and Pulmonary Rehab  Date 11/11/22  Educator NT  Instruction Review Code 1- Verbalizes Understanding       Falls Prevention: - Provides verbal and written material to individual with discussion of falls prevention and safety. Flowsheet Row Pulmonary Rehab from 02/03/2023 in [facility] Cardiac and Pulmonary Rehab  Date 11/11/22  Educator NT  Instruction Review Code 1- Verbalizes Understanding       Chronic Lung Disease Review: - Group verbal instruction with posters, models, PowerPoint presentations and videos,  to review new updates, new respiratory medications, new advancements in procedures and treatments. Providing information on websites and "800" numbers for continued self-education. Includes information about supplement oxygen, available portable oxygen systems, continuous and intermittent flow rates, oxygen safety, concentrators, and Medicare reimbursement for oxygen. Explanation of Pulmonary Drugs, including class, frequency, complications, importance of spacers, rinsing mouth after steroid MDI's, and proper cleaning methods for nebulizers. Review of basic lung anatomy and physiology related to function, structure, and complications of lung disease. Review of risk factors. Discussion about methods for diagnosing sleep apnea and types of masks and machines for OSA. Includes a review of the  use of types of environmental controls: home humidity, furnaces, filters, dust mite/pet prevention, HEPA vacuums. Discussion about weather changes,  air quality and the benefits of nasal washing. Instruction on Warning signs, infection symptoms, calling MD promptly, preventive modes, and value of vaccinations. Review of effective airway clearance, coughing and/or vibration techniques. Emphasizing that all should Create an Action Plan. Written material given at graduation. Flowsheet Row Pulmonary Rehab from 02/03/2023 in [facility] Cardiac and Pulmonary Rehab  Education need identified 11/11/22  Date 01/06/23  Educator [facility]  Instruction Review Code 1- Verbalizes Understanding       AED/CPR: - Group verbal and written instruction with the use of models to demonstrate the basic use of the AED with the basic ABC's of resuscitation.    Anatomy and Cardiac Procedures: - Group verbal and visual presentation and models provide information about basic cardiac anatomy and function. Reviews the testing methods done to diagnose heart disease and the outcomes of the test results. Describes the treatment choices: Medical Management, Angioplasty, or Coronary Bypass Surgery for treating various heart conditions including Myocardial Infarction, Angina, Valve Disease, and Cardiac Arrhythmias.  Written material given at graduation. Flowsheet Row Pulmonary Rehab from 02/03/2023 in [facility] Cardiac and Pulmonary Rehab  Date 12/16/22  Educator SB  Instruction Review Code 1- Verbalizes Understanding       Medication Safety: - Group verbal and visual instruction to review commonly prescribed medications for heart and lung disease. Reviews the medication, class of the drug, and side effects. Includes the steps to properly store meds and maintain the prescription regimen.  Written material given at graduation. Flowsheet Row Pulmonary Rehab from 02/03/2023 in [facility] Cardiac and Pulmonary Rehab  Date 12/21/22  Educator [facility]  Instruction  Review Code 1- Verbalizes Understanding       Other: -Provides group and verbal instruction on various topics (see comments)   Knowledge Questionnaire Score:  Knowledge Questionnaire Score - 02/15/23 0742       Knowledge Questionnaire Score   Pre Score 15/18    Post Score 15/18              Core Components/Risk Factors/Patient Goals at Admission:  Personal Goals and Risk Factors at Admission - 11/08/22 0939       Core Components/Risk Factors/Patient Goals on Admission    Weight Management Yes;Weight Maintenance    Intervention Weight Management: Develop a combined nutrition and exercise program designed to reach desired caloric intake, while maintaining appropriate intake of nutrient and fiber, sodium and fats, and appropriate energy expenditure required for the weight goal.;Weight Management: Provide education and appropriate resources to help participant work on and attain dietary goals.;Weight Management/Obesity: Establish reasonable short term and long term weight goals.    Expected Outcomes Short Term: Continue to assess and modify interventions until short term weight is achieved;Long Term: Adherence to nutrition and physical activity/exercise program aimed toward attainment of established weight goal;Weight Maintenance: Understanding of the daily nutrition guidelines, which includes 25-35% calories from fat, 7% or less cal from saturated fats, less than 200mg  cholesterol, less than 1.5gm of sodium, & 5 or more servings of fruits and vegetables daily;Understanding recommendations for meals to include 15-35% energy as protein, 25-35% energy from fat, 35-60% energy from carbohydrates, less than 200mg  of dietary cholesterol, 20-35 gm of total fiber daily;Understanding of distribution of calorie intake throughout the day with the consumption of 4-5 meals/snacks    Improve shortness of breath with ADL's Yes    Intervention Provide education, individualized exercise plan and daily  activity instruction to help decrease symptoms of SOB with activities of daily living.    Expected Outcomes Short Term:  Improve cardiorespiratory fitness to achieve a reduction of symptoms when performing ADLs;Long Term: Be able to perform more ADLs without symptoms or delay the onset of symptoms    Hypertension Yes    Intervention Provide education on lifestyle modifcations including regular physical activity/exercise, weight management, moderate sodium restriction and increased consumption of fresh fruit, vegetables, and low fat dairy, alcohol moderation, and smoking cessation.;Monitor prescription use compliance.    Expected Outcomes Short Term: Continued assessment and intervention until BP is < 140/77mm HG in hypertensive participants. < 130/54mm HG in hypertensive participants with diabetes, heart failure or chronic kidney disease.;Long Term: Maintenance of blood pressure at goal levels.    Lipids Yes    Intervention Provide education and support for participant on nutrition & aerobic/resistive exercise along with prescribed medications to achieve LDL 70mg , HDL >40mg .    Expected Outcomes Short Term: Participant states understanding of desired cholesterol values and is compliant with medications prescribed. Participant is following exercise prescription and nutrition guidelines.;Long Term: Cholesterol controlled with medications as prescribed, with individualized exercise RX and with personalized nutrition plan. Value goals: LDL < 70mg , HDL > 40 mg.             Education:Diabetes - Individual verbal and written instruction to review signs/symptoms of diabetes, desired ranges of glucose level fasting, after meals and with exercise. Acknowledge that pre and post exercise glucose checks will be done for 3 sessions at entry of program.   Know Your Numbers and Heart Failure: - Group verbal and visual instruction to discuss disease risk factors for cardiac and pulmonary disease and treatment  options.  Reviews associated critical values for Overweight/Obesity, Hypertension, Cholesterol, and Diabetes.  Discusses basics of heart failure: signs/symptoms and treatments.  Introduces Heart Failure Zone chart for action plan for heart failure.  Written material given at graduation. Flowsheet Row Pulmonary Rehab from 02/03/2023 in [facility] Cardiac and Pulmonary Rehab  Date 12/30/22  Educator SB  Instruction Review Code 1- Verbalizes Understanding       Core Components/Risk Factors/Patient Goals Review:   Goals and Risk Factor Review     Row Name 12/02/22 0744 01/04/23 0809 01/20/23 0826 02/15/23 0747       Core Components/Risk Factors/Patient Goals Review   Personal Goals Review Improve shortness of breath with ADL's Improve shortness of breath with ADL's Weight Management/Obesity;Improve shortness of breath with ADL's;Hypertension;Lipids Weight Management/Obesity;Improve shortness of breath with ADL's;Hypertension    Review Spoke to patient about their shortness of breath and what they can do to improve. Patient has been informed of breathing techniques when starting the program. Patient is informed to tell staff if they have had any med changes and that certain meds they are taking or not taking can be causing shortness of breath. Patient states that he has seen major improvements with his shortness of breath, due to his exercise at home as well as in the program. Patient has been informed of breathing techniques when starting the program. Patient is informed to tell staff if they have had any med changes and that certain meds they are taking or not taking can be causing shortness of breath. Larry Maxwell continues to see improvement with his shortness of breath with ADL's. He has not had a flare up in the past 3 weeks . He carries his recus inhaler with his at all times. He continues to work on weight loss.  The holiday has put a puse on that!  he had lost about 15 pounds prior to attending the program.  He  is gaining back that weight. He is working on balanced meals,  with less fats, fried foods and sodium.  He continues to manage his meds and take them as prescribed.  BP readings are in normal range.. NO recent cholesterol labs to review. Larry Maxwell is seeing improvements with his overall shortness of breath with ADL's. He says that his last flare up was last thursday and he used his breathing techniques to combat those symptoms. He is currently happy with his weight and would like to maintain the 200-215lb mark. He says that he is checking his blood pressure everyday and continuing to take his medication.    Expected Outcomes Short: Attend [facility] regularly to improve shortness of breath with ADL's. Long: maintain independence with ADL's Short: Attend [facility] regularly to improve shortness of breath with ADL's. Long: maintain independence with ADL's STG continue to work on the weight, HTN and cholesterol with the exercise, and nutrition changes he is making during the program.  COntinue with daily inhaler and nebuizer therapy to manage the shortness of breath symptoms with ADLs.  LTG Larry Maxwell continues to work on his goals with progress that leads to met.   Nutriton changes, taking meds and continuing the exercise progression. Short: Continue to maintain weight, maintian a healthy blood pressure, and reduce shortness of breathe with exercise. Long: Continue exercise to reduce symptoms.             Core Components/Risk Factors/Patient Goals at Discharge (Final Review):   Goals and Risk Factor Review - 02/15/23 0747       Core Components/Risk Factors/Patient Goals Review   Personal Goals Review Weight Management/Obesity;Improve shortness of breath with ADL's;Hypertension    Review Larry Maxwell is seeing improvements with his overall shortness of breath with ADL's. He says that his last flare up was last thursday and he used his breathing techniques to combat those symptoms. He is currently happy with his weight and  would like to maintain the 200-215lb mark. He says that he is checking his blood pressure everyday and continuing to take his medication.    Expected Outcomes Short: Continue to maintain weight, maintian a healthy blood pressure, and reduce shortness of breathe with exercise. Long: Continue exercise to reduce symptoms.             ITP Comments:  ITP Comments     Row Name 11/08/22 1610 11/11/22 1322 11/16/22 0831 11/24/22 1244 12/15/22 1140   ITP Comments Virtual Visit completed. Patient informed on EP and RD appointment and 6 Minute walk test. Patient also informed of patient health questionnaires on My Chart. Patient Verbalizes understanding. Visit diagnosis [facility] in [facility]. Completed and gym orientation. Initial ITP created and sent for review to Dr. Jinny Sanders, Medical Director. First full day of exercise!  Patient was oriented to gym and equipment including functions, settings, policies, and procedures.  Patient's individual exercise prescription and treatment plan were reviewed.  All starting workloads were established based on the results of the 6 minute walk test done at initial orientation visit.  The plan for exercise progression was also introduced and progression will be customized based on patient's performance and goals. 30 Day review completed. Medical Director ITP review done, changes made as directed, and signed approval by Medical Director.    new to prgram 30 Day review completed. Medical Director ITP review done, changes made as directed, and signed approval by Medical Director.    Row Name 01/12/23 1000 02/09/23 1309 02/24/23 0759  ITP Comments 30 Day review completed. Medical Director ITP review done, changes made as directed, and signed approval by Medical Director. 30 Day review completed. Medical Director ITP review done, changes made as directed, and signed approval by Medical Director. Damontre graduated today from  rehab with 36 sessions completed.   Details of the patient's exercise prescription and what He needs to do in order to continue the prescription and progress were discussed with patient.  Patient was given a copy of prescription and goals.  Patient verbalized understanding. Larry Maxwell plans to continue to exercise by joining the [facility]              Comments: Discharge ITP

## 2023-02-24 NOTE — Progress Notes (Signed)
Discharge Note for  Larry Maxwell     11-16-1965        [name] graduated today from  rehab with 36 sessions completed.  Details of the patient's exercise prescription and what He needs to do in order to continue the prescription and progress were discussed with patient.  Patient was given a copy of prescription and goals.  Patient verbalized understanding. [name] plans to continue to exercise by joining the [facility].    6 Minute Walk     Row Name 11/11/22 1329 02/10/23 0750       6 Minute Walk   Phase Initial Discharge    Distance 1030 feet 1315 feet    Distance % Change -- 27.7 %    Distance Feet Change -- 285 ft    Walk Time 6 minutes 6 minutes    # of Rest Breaks 0 0    MPH 1.95 2.49    METS 3.65 3.71    RPE 12 11    Perceived Dyspnea  2 0    VO2 Peak 12.78 12.97    Symptoms Yes (comment) No    Comments cough, chest toghtness/pain 7/10 --    Resting HR 78 bpm 68 bpm    Resting BP 124/78 114/58    Resting Oxygen Saturation  98 % 98 %    Exercise Oxygen Saturation  during 6 min walk 94 % 93 %    Max Ex. HR 107 bpm 90 bpm    Max Ex. BP 148/84 124/66    2 Minute Post BP 128/76 118/62      Interval HR   1 Minute HR 107 83    2 Minute HR 100 88    3 Minute HR 103 90    4 Minute HR 99 88    5 Minute HR 103 89    6 Minute HR 96 82    2 Minute Post HR 87 64    Interval Heart Rate? Yes Yes      Interval Oxygen   Interval Oxygen? Yes Yes    Baseline Oxygen Saturation % 98 % 98 %    1 Minute Oxygen Saturation % 98 % 98 %    1 Minute Liters of Oxygen 0 L  RA 0 L  RA    2 Minute Oxygen Saturation % 99 % 98 %    2 Minute Liters of Oxygen 0 L 0 L    3 Minute Oxygen Saturation % 98 % 96 %    3 Minute Liters of Oxygen 0 L 0 L    4 Minute Oxygen Saturation % 94 % 96 %    4 Minute Liters of Oxygen 0 L 0 L    5 Minute Oxygen Saturation % 97 % 96 %    5 Minute Liters of Oxygen 0 L 0 L    6 Minute Oxygen Saturation % 98 % 93 %    6 Minute Liters of Oxygen 0 L 0 L    2 Minute Post  Oxygen Saturation % 99 % 96 %    2 Minute Post Liters of Oxygen 0 L 0 L

## 2023-02-28 ENCOUNTER — Other Ambulatory Visit: Payer: Self-pay

## 2023-02-28 ENCOUNTER — Telehealth: Payer: Self-pay

## 2023-02-28 DIAGNOSIS — Z1211 Encounter for screening for malignant neoplasm of colon: Secondary | ICD-10-CM

## 2023-02-28 MED ORDER — GOLYTELY 236 G PO SOLR
4000.0000 mL | Freq: Once | ORAL | 0 refills | Status: AC
  Filled 2023-02-28: qty 4000, 1d supply, fill #0

## 2023-02-28 NOTE — Telephone Encounter (Signed)
Gastroenterology Pre-Procedure Review  Request Date: 03/29/22 Requesting Physician: Dr. Tobi Bastos  PATIENT REVIEW QUESTIONS: The patient responded to the following health history questions as indicated:    1. Are you having any GI issues? no 2. Do you have a personal history of Polyps? no 3. Do you have a family history of Colon Cancer or Polyps? no 4. Diabetes Mellitus? no 5. Joint replacements in the past 12 months?no 6. Major health problems in the past 3 months?no 7. Any artificial heart valves, MVP, or defibrillator?no    MEDICATIONS & ALLERGIES:    Patient reports the following regarding taking any anticoagulation/antiplatelet therapy:   Plavix, Coumadin, Eliquis, Xarelto, Lovenox, Pradaxa, Brilinta, or Effient? no Aspirin? no  Patient confirms/reports the following medications:  Current Outpatient Medications  Medication Sig Dispense Refill   albuterol (VENTOLIN HFA) 108 (90 Base) MCG/ACT inhaler Inhale 2 puffs into the lungs every 6 (six) hours as needed for wheezing or shortness of breath. 18 g 1   amLODipine (NORVASC) 10 MG tablet Take 1 tablet (10 mg total) by mouth daily for 30 doses. 30 tablet 2   atorvastatin (LIPITOR) 20 MG tablet Take by mouth.     benzonatate (TESSALON) 100 MG capsule Take 1 capsule (100 mg total) by mouth 2 (two) times daily as needed for cough. 20 capsule 0   Budeson-Glycopyrrol-Formoterol (BREZTRI AEROSPHERE) 160-9-4.8 MCG/ACT AERO Inhale 2 puffs into the lungs in the morning and at bedtime. (Patient not taking: Reported on 02/16/2023)     Budeson-Glycopyrrol-Formoterol (BREZTRI AEROSPHERE) 160-9-4.8 MCG/ACT AERO Inhale 2 puffs into the lungs in the morning and at bedtime. 32.1 g 3   Budeson-Glycopyrrol-Formoterol (BREZTRI AEROSPHERE) 160-9-4.8 MCG/ACT AERO Inhale 2 puffs into the lungs in the morning and at bedtime. 11.8 g    celecoxib (CELEBREX) 100 MG capsule Take 1 capsule (100 mg total) by mouth 2 (two) times daily. 60 capsule 2   cetirizine (ZYRTEC)  10 MG tablet Take 1 tablet (10 mg total) by mouth daily. 30 tablet 11   chlorthalidone (HYGROTON) 25 MG tablet Take 1 tablet (25 mg total) by mouth daily. 90 tablet 0   DULoxetine (CYMBALTA) 60 MG capsule Take 1 tablet by mouth daily.     gabapentin (NEURONTIN) 300 MG capsule Take 1 capsule (300 mg total) by mouth in the morning AND 1 capsule (300 mg total) daily AND 2 capsules (600 mg total) at bedtime. 120 capsule 0   ipratropium-albuterol (DUONEB) 0.5-2.5 (3) MG/3ML SOLN Take 3 mLs by nebulization every 6 (six) hours as needed. 360 mL 3   loratadine (CLARITIN) 10 MG tablet Take by mouth.     methocarbamol (ROBAXIN) 500 MG tablet Take 1 tablet (500 mg total) by mouth every 6 (six) hours as needed for muscle spasms. 20 tablet 0   montelukast (SINGULAIR) 10 MG tablet Take 1 tablet (10 mg total) by mouth at bedtime. 30 tablet 0   naloxone (NARCAN) nasal spray 4 mg/0.1 mL Place into the nose.     omeprazole (PRILOSEC) 20 MG capsule Take 1 capsule (20 mg total) by mouth daily. 30 capsule 3   rosuvastatin (CRESTOR) 10 MG tablet Take 1 tablet (10 mg total) by mouth daily. 90 tablet 3   sertraline (ZOLOFT) 50 MG tablet Take 1 tablet (50 mg total) by mouth daily. 30 tablet 3   Spacer/Aero-Holding Chambers (AEROCHAMBER MINI CHAMBER) DEVI Use as directed 1 each 0   valsartan (DIOVAN) 80 MG tablet Take 1 tablet (80 mg total) by mouth daily. 90 tablet 3  No current facility-administered medications for this visit.    Patient confirms/reports the following allergies:  Allergies  Allergen Reactions   Lisinopril Swelling    Angioedema and significant swelling in ankles as well    No orders of the defined types were placed in this encounter.   AUTHORIZATION INFORMATION Primary Insurance: 1D#: Group #:  Secondary Insurance: 1D#: Group #:  SCHEDULE INFORMATION: Date: 03/29/23 Time: Location: [facility]

## 2023-03-01 ENCOUNTER — Ambulatory Visit (INDEPENDENT_AMBULATORY_CARE_PROVIDER_SITE_OTHER): Payer: MEDICAID | Admitting: Clinical

## 2023-03-01 DIAGNOSIS — F323 Major depressive disorder, single episode, severe with psychotic features: Secondary | ICD-10-CM

## 2023-03-01 NOTE — Progress Notes (Signed)
 [facility] Counselor/Therapist Progress Note  Patient ID: Larry Maxwell, MRN: [id number],    Date: 03/01/2023  Time Spent: 8:36am - 9:16am : 40 minutes   Treatment Type: Individual Therapy  Reported Symptoms: none reported  Mental Status Exam: Appearance:  Neat and Well Groomed     Behavior: Appropriate  Motor: Normal  Speech/Language:  Clear and Coherent and Normal Rate  Affect: Appropriate  Mood: normal  Thought process: normal  Thought content:   WNL  Sensory/Perceptual disturbances:   WNL  Orientation: oriented to person, place, and situation  Attention: Good  Concentration: Good  Memory: WNL  Fund of knowledge:  Good  Insight:   Good  Judgment:  Good  Impulse Control: Good   Risk Assessment: Danger to Self:  No Patient denied current suicidal ideation  Self-injurious Behavior: No Danger to Others: No Patient denied current homicidal ideation Duty to Warn:no Physical Aggression / Violence:No  Access to Firearms a concern: No  Gang Involvement:No   Subjective: Patient stated, I've been doing pretty good in response to events since last session.  Patient reported he has secured an apartment and is moving in to his apartment today. Patient stated I'm excited, Im happy for myself in reference to upcoming move. Patient reported he completed pulmonary rehab last week. Patient stated, they're happy for me in response to patient's support system and upcoming move. Patient stated, I'm excited about everything that's going on right now. Patient stated, I've been in a real good mood in response to mood since last session and current mood. Patient reported he has an appointment with his psychiatrist on March 10, 2023. Patient stated, positive thinking, talking to people, expressing my feelings towards people, some of the stuff I had bottled inside, and spirituality as contributing factors to improvement in mood. Patient stated, I feel like I'm  socializing more and expressing the way I feel. Patient reported going out and reported he recently went to dinner and the movies with several acquaintances. Patient reported he has plans to go out with his acquaintances this weekend. Patient stated, that was just a start in reference to outing with acquaintances. Patient reported he spoke with all five of his brothers on Sunday and reported they are planning a trip together in April. Patient stated, I'm looking forward to it in reference to the trip with his brothers. Patient stated, everything is going real good. Patient stated, I'm just so excited about everything coming together for me, I feel like I'm being blessed all at one time. Patient reported he purchased furniture for his apartment. Patient stated, I can do that in response to gratitude exercise.    Interventions: Cognitive Behavioral Therapy. Clinician conducted session via caregility video from clinician's office at [facility]. Patient provided verbal consent to proceed with telehealth session and is aware of limitations of telephone or video visits. Patient participated in session from patient's home. Reviewed events since last session. Discussed patient's upcoming move and patient's thoughts/feelings in response. Assessed patient's mood since last session and current mood. Assisted patient in exploring and identifying contributing factors to improvement in mood. Explored recent increase in socialization and patient's response. Reviewed patient's thought record. Provided psycho education related to gratitude exercises. Clinician requested for homework patient complete thought record and gratitude exercise daily.    Collaboration of Care: Other not required at this time   Diagnosis:  Major depressive disorder, single episode, severe with psychotic features [facility])     Plan: Patient is to utilize  Cognitive Behavioral Therapy, thought re-framing, mindfulness, healthy  communication, and coping strategies to decrease symptoms associated with their diagnosis. Frequency: weekly  Modality: individual      Long-term goal:   Reduce overall level, frequency, and intensity of the feelings of depression as evidenced by decreased isolation, depressed mood, lack of appetite, difficulty falling asleep and staying asleep, lack of energy, loss of interest, and difficulty focusing from 7 days/week to 0 to 1 days/week per patient report for at least 3 consecutive months. Target Date: 01/05/24  Progress: progressing    Short-term goal:  Identify triggers for depressive symptoms Target Date: 07/06/23  Progress: progressing    Develop and implement coping strategies to utilize in response to depressive symptoms Target Date: 07/06/23  Progress: progressing    Increase participation in enjoyable activities from 3 days per week to 5 day per week Target Date: 07/06/23  Progress: progressing    patient will verbalize positive statements regarding self and his ability to cope with life stressors Target Date: 07/06/23  Progress: established 01/05/23    Verbalize patient's thoughts and feelings to through effective communication strategies per patient's report Target Date: 07/06/23  Progress: progressing    Identify, challenge, and replace negative thought patterns and negative self talk that contribute to feelings of depression with positive thoughts, beliefs, and positive self talk per patient's report Target Date: 07/06/23  Progress: progressing     Darice Seats, LCSW

## 2023-03-01 NOTE — Progress Notes (Signed)
   Larry Barthel, LCSW

## 2023-03-03 ENCOUNTER — Ambulatory Visit: Payer: Medicaid Other | Admitting: Clinical
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
## 2023-03-10 ENCOUNTER — Ambulatory Visit: Payer: Self-pay | Admitting: Psychiatry

## 2023-03-11 ENCOUNTER — Other Ambulatory Visit: Payer: Self-pay

## 2023-03-16 ENCOUNTER — Ambulatory Visit: Payer: MEDICAID | Admitting: Clinical

## 2023-03-16 DIAGNOSIS — F323 Major depressive disorder, single episode, severe with psychotic features: Secondary | ICD-10-CM | POA: Diagnosis not present

## 2023-03-16 NOTE — Progress Notes (Signed)
   Doree Barthel, LCSW

## 2023-03-16 NOTE — Progress Notes (Signed)
[facility] Counselor/Therapist Progress Note  Patient ID: Larry Maxwell, MRN: [id number],    Date: 03/16/2023  Time Spent: 8:32am - 9:18am : 46 minutes   Treatment Type: Individual Therapy  Reported Symptoms: none reported  Mental Status Exam: Appearance:  Neat and Well Groomed     Behavior: Appropriate  Motor: Normal  Speech/Language:  Clear and Coherent and Normal Rate  Affect: Appropriate  Mood: normal  Thought process: normal  Thought content:   WNL  Sensory/Perceptual disturbances:   WNL  Orientation: oriented to person, place, and situation  Attention: Good  Concentration: Good  Memory: WNL  Fund of knowledge:  Good  Insight:   Good  Judgment:  Good  Impulse Control: Good   Risk Assessment: Danger to Self:  No Patient denied current suicidal ideation  Self-injurious Behavior: No Danger to Others: No Patient denied current homicidal ideation Duty to Warn:no Physical Aggression / Violence:No  Access to Firearms a concern: No  Gang Involvement:No   Subjective: Patient stated, "everything's been going real good", "everything is falling in place" in response to events since last session. Patient reported he recently moved into his apartment and stated, "I like it". Patient stated, "Larry Maxwell just got a peace of mind right now". Patient stated, "I like the area" in reference to patient's move. Patient stated, "I feel good that I have accomplished everything in 6 months" in reference to patient's move and health related goals. Patient stated,  "I've been in a good mood, I haven't been down, I haven't been depressed", "I think it was all because of my situation I was in", "trying to get moved", "my health situation". Patient stated, "I have good days, I have bad days", "some days I don't want to be bothered with nobody". Patient reported thoughts about his past are a trigger for depressed mood/"bad days" and patient stated, "the less I think about it (the past) the  better off I am". Patient reported "more good days than bad days" and stated, "I just take it one day at a time, I try hard not to reminisce and think about my past". During today's session patient identified examples of positive self talk.  Patient reported he lives close to a park and plans to attend the park when the weather improves. Patient stated, "I just have to start focusing on Larry Maxwell". Patient stated, "I feel good", "Im able to express my feelings". Patient reported he spent time with his younger brother recently and enjoyed their visit. Patient reported his brother plans to visit patient this weekend.    Interventions: Cognitive Behavioral Therapy. Clinician conducted session via caregility video from clinician's home office. Patient provided verbal consent to proceed with telehealth session and is aware of limitations of telephone or video visits. Patient participated in session from patient's home. Reviewed events since last session. Discussed patient's recent move and patient's response to the move. Assessed patient's mood since last session and current mood. Assisted patient in exploring and identifying triggers for depressed mood and "bad days". Assisted patient in exploring and identifying thoughts associated with depressed mood and "bad days". Provided psycho education related to the use of positive self talk. Reviewed patient's participation in enjoyable activities since last session. Reviewed patient's thought record. Clinician requested for homework patient continue thought record and gratitude exercise daily.    Collaboration of Care: Other not required at this time   Diagnosis:  Major depressive disorder, single episode, severe with psychotic features [facility])     Plan: Patient  is to utilize [employer] Therapy, thought re-framing, mindfulness, healthy communication, and coping strategies to decrease symptoms associated with their diagnosis. Frequency: weekly  Modality:  individual      Long-term goal:   Reduce overall level, frequency, and intensity of the feelings of depression as evidenced by decreased isolation, depressed mood, lack of appetite, difficulty falling asleep and staying asleep, lack of energy, loss of interest, and difficulty focusing from 7 days/week to 0 to 1 days/week per patient report for at least 3 consecutive months. Target Date: 01/05/24  Progress: progressing    Short-term goal:  Identify triggers for depressive symptoms Target Date: 07/06/23  Progress: progressing    Develop and implement coping strategies to utilize in response to depressive symptoms Target Date: 07/06/23  Progress: progressing    Increase participation in enjoyable activities from 3 days per week to 5 day per week Target Date: 07/06/23  Progress: progressing    patient will verbalize positive statements regarding self and his ability to cope with life stressors Target Date: 07/06/23  Progress: established 01/05/23    Verbalize patient's thoughts and feelings to through effective communication strategies per patient's report Target Date: 07/06/23  Progress: progressing    Identify, challenge, and replace negative thought patterns and negative self talk that contribute to feelings of depression with positive thoughts, beliefs, and positive self talk per patient's report Target Date: 07/06/23  Progress: progressing    Doree Barthel, LCSW

## 2023-03-17 ENCOUNTER — Ambulatory Visit: Payer: Self-pay | Admitting: Family Medicine

## 2023-03-18 ENCOUNTER — Other Ambulatory Visit: Payer: Self-pay | Admitting: Obstetrics and Gynecology

## 2023-03-18 NOTE — Patient Outreach (Signed)
Care Coordination  03/18/2023  FANNIE ALOMAR [birth year] [id number]  RNCM called patient at scheduled time.  Patient answered phone and stated not a good time-asked to be called back another time.  RNCM rescheduled appointment at patient request.  Kathi Der RN, BSN, CM [facility] RN Care Manager Direct Dial [phone]/WGN [phone] Website: [email or web address]

## 2023-03-22 ENCOUNTER — Encounter: Payer: Self-pay | Admitting: Gastroenterology

## 2023-03-25 ENCOUNTER — Other Ambulatory Visit: Payer: Self-pay | Admitting: Obstetrics and Gynecology

## 2023-03-25 NOTE — Patient Outreach (Signed)
 Medicaid Managed Care   Nurse Care Manager Note  03/25/2023 Name:  Larry Maxwell MRN:  [id number] DOB:  02/13/1965  Larry Maxwell is an 58 y.o. year old male who is a primary patient of Larry Hay, DO.  The [facility] Managed Care Coordination team was consulted for assistance with:    Chronic healthcare management needs, HTN, asthma, COPD, LBP with left sided sciatica, HLD, depression/anxiety  Larry Maxwell was given information about Medicaid Managed Care Coordination team services today. Larry Maxwell Patient agreed to services and verbal consent obtained.  Engaged with patient by telephone for follow up visit in response to provider referral for case management and/or care coordination services.   Patient is participating in a Managed Medicaid Plan:  Yes  Assessments/Interventions:  Review of past medical history, allergies, medications, health status, including review of consultants reports, laboratory and other test data, was performed as part of comprehensive evaluation and provision of chronic care management services.  SDOH (Social Drivers of Health) assessments and interventions performed: SDOH Interventions    Flowsheet Row Patient Outreach Telephone from 03/25/2023 in [place] POPULATION HEALTH DEPARTMENT Most recent reading at 03/25/2023  1:43 PM Patient Outreach Telephone from 03/18/2023 in [place] POPULATION HEALTH DEPARTMENT Most recent reading at 03/18/2023 12:36 PM Patient Outreach Telephone from 02/15/2023 in [place] POPULATION HEALTH DEPARTMENT Most recent reading at 02/15/2023 12:36 PM Pulmonary Rehab from 02/15/2023 in [facility] Most recent reading at 02/15/2023  7:42 AM Patient Outreach Telephone from 02/11/2023 in [place] POPULATION HEALTH DEPARTMENT Most recent reading at 02/11/2023  2:01 PM Patient Outreach Telephone from 01/12/2023 in [place] POPULATION HEALTH DEPARTMENT Most recent reading at 01/12/2023  2:04 PM  SDOH  Interventions        Food Insecurity Interventions Intervention Not Indicated, Other (Comment)  [patient has food stamps and food insecurity is not an issue for him right now per patient-he has what he needs] -- -- -- -- --  Transportation Interventions -- -- -- -- Payor Benefit  [Modicare number emailed to pt] --  Utilities Interventions -- Intervention Not Indicated -- -- -- --  Alcohol Usage Interventions -- Intervention Not Indicated (Score <7) -- -- -- Intervention Not Indicated (Score <7)  Depression Interventions/Treatment  -- -- -- Medication -- --  Stress Interventions -- -- -- -- Programmer, applications Provided, Provide Counseling --  Social Connections Interventions Intervention Not Indicated -- -- -- -- Intervention Not Indicated  Health Literacy Interventions -- -- Intervention Not Indicated -- -- --     Care Plan Allergies  Allergen Reactions   Lisinopril Swelling    Angioedema and significant swelling in ankles as well    Medications Reviewed Today     Reviewed by Larry Chandler, RN (Registered Nurse) on 03/25/23 at 1344  Med List Status: <None>   Medication Order Taking? Sig Documenting Provider Last Dose Status Informant  albuterol (VENTOLIN HFA) 108 (90 Base) MCG/ACT inhaler [id number] No Inhale 2 puffs into the lungs every 6 (six) hours as needed for wheezing or shortness of breath. Larry Hay, DO Taking Active   amLODipine (NORVASC) 10 MG tablet [id number] No Take 1 tablet (10 mg total) by mouth daily for 30 doses. Larry Hay, DO Taking Expired 02/23/23 2359   atorvastatin (LIPITOR) 20 MG tablet [id number] No Take by mouth. [provider] Taking Active   benzonatate (TESSALON) 100 MG capsule [id number] No Take 1 capsule (100 mg total) by mouth 2 (two) times  daily as needed for cough. Larry Hay, DO Taking Active   Budeson-Glycopyrrol-Formoterol (BREZTRI AEROSPHERE) 160-9-4.8 MCG/ACT AERO [id number] No Inhale 2 puffs into the lungs in the morning and at  bedtime.  Patient not taking: Reported on 02/16/2023   [provider] Not Taking Active   Budeson-Glycopyrrol-Formoterol (BREZTRI AEROSPHERE) 160-9-4.8 MCG/ACT AERO [id number]  Inhale 2 puffs into the lungs in the morning and at bedtime. Janann Colonel, MD  Active   Budeson-Glycopyrrol-Formoterol (BREZTRI AEROSPHERE) 160-9-4.8 MCG/ACT Sandrea Matte [id number]  Inhale 2 puffs into the lungs in the morning and at bedtime. Janann Colonel, MD  Active   celecoxib (CELEBREX) 100 MG capsule [id number] No Take 1 capsule (100 mg total) by mouth 2 (two) times daily. Larry Hay, DO Taking Active   cetirizine (ZYRTEC) 10 MG tablet [id number] No Take 1 tablet (10 mg total) by mouth daily. Larry Hay, DO Taking Active   chlorthalidone (HYGROTON) 25 MG tablet [id number] No Take 1 tablet (25 mg total) by mouth daily. Larry Hay, DO Taking Active   DULoxetine (CYMBALTA) 60 MG capsule [id number] No Take 1 tablet by mouth daily. [provider] Taking Active   gabapentin (NEURONTIN) 300 MG capsule [id number] No Take 1 capsule (300 mg total) by mouth in the morning AND 1 capsule (300 mg total) daily AND 2 capsules (600 mg total) at bedtime. Larry Hay, DO Taking Active   ipratropium-albuterol (DUONEB) 0.5-2.5 (3) MG/3ML SOLN [id number] No Take 3 mLs by nebulization every 6 (six) hours as needed. Janann Colonel, MD Taking Active   loratadine (CLARITIN) 10 MG tablet [id number] No Take by mouth. [provider] Taking Active   methocarbamol (ROBAXIN) 500 MG tablet [id number] No Take 1 tablet (500 mg total) by mouth every 6 (six) hours as needed for muscle spasms. Larry Hay, DO Taking Active   montelukast (SINGULAIR) 10 MG tablet [id number] No Take 1 tablet (10 mg total) by mouth at bedtime. Larry Hay, DO Taking Active   naloxone [facility]) nasal spray 4 mg/0.1 mL [id number] No Place into the nose. [provider] Taking Active   omeprazole (PRILOSEC) 20 MG  capsule [id number] No Take 1 capsule (20 mg total) by mouth daily. Larry Hay, DO Taking Active   rosuvastatin (CRESTOR) 10 MG tablet [id number] No Take 1 tablet (10 mg total) by mouth daily. Larry Hay, DO Taking Active   sertraline (ZOLOFT) 50 MG tablet [id number] No Take 1 tablet (50 mg total) by mouth daily. Larry Hay, DO Taking Active   Spacer/Aero-Holding Chambers (AEROCHAMBER MINI [place]) DEVI [id number]  Use as directed Assaker, [place], MD  Active   valsartan (DIOVAN) 80 MG tablet [id number] No Take 1 tablet (80 mg total) by mouth daily. Larry Hay, DO Taking Active            Patient Active Problem List   Diagnosis Date Noted   Blood glucose elevated 02/02/2023   Epigastric discomfort 02/02/2023   History of pancreatitis 02/02/2023   Chronic kidney disease, stage 3a (HCC) 02/02/2023   Insomnia due to medical condition 12/15/2022   Mixed restrictive and obstructive lung disease (HCC) 10/25/2022   Asthma-COPD overlap syndrome (HCC) 10/25/2022   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) 10/25/2022   Restrictive lung disease 08/25/2022   Paresthesia of right upper extremity 08/17/2022   History of diverticulosis 08/09/2022   Nicotine dependence with current use 08/04/2022   Prediabetes 08/02/2022   Hypertension 07/21/2022   Asthma, allergic, mild persistent, uncomplicated  07/21/2022   Depression, major, single episode, moderate (HCC) 07/21/2022   Mixed hyperlipidemia 07/21/2022   Chronic midline low back pain with left-sided sciatica 07/21/2022   Conditions to be addressed/monitored per PCP order:  Chronic healthcare management needs, HTN, asthma, COPD, LBP with left sided sciatica, HLD, depression/anxiety  Care Plan : RN Care Manager Plan of Care  Updates made by Larry Chandler, RN since 03/25/2023 12:00 AM     Problem: Health Promotion or Disease Self-Management (General Plan of Care)      Long-Range Goal: Chronic Disease Management and  Care Coordination Needs   Start Date: 01/12/2023  Expected End Date: 06/22/2023  Priority: High  Note:   Current Barriers:  Knowledge Deficits related to plan of care for management of HTN, asthma, COPD, LBP with left sided sciatica, HLD, depression Care Coordination needs related to housing resources Chronic Disease Management support and education needs related to HTN, asthma, COPD, LBP with left sided sciatica, HLD, depression 03/25/23:  No complaints today-moved to new apartment last week and is going well.  Finished PULM REHAB in January and feeling well.  Not checking BP.  Disability approved.    RNCM Clinical Goal(s):  verbalize understanding of plan for management of  HTN, asthma, COPD, LBP with left sided sciatica, HLD, depressionas evidenced by patient report. verbalize basic understanding of  HTN, asthma, COPD, LBP with left sided sciatica, HLD, depression  disease process and self health management plan as evidenced by patient report. take all medications exactly as prescribed and will call provider for medication related questions as evidenced by patient report. demonstrate understanding of rationale for each prescribed medication as evidenced by patient report. attend all scheduled medical appointments: as evidenced by patient report and EMR review. demonstrate ongoing  adherence to prescribed treatment plan for HTN, asthma, COPD, LBP with left sided sciatica, HLD, depression  as evidenced by patient report and EMR review. continue to work with RN Care Manager to address care management and care coordination needs related to HTN, asthma, COPD, LBP with left sided sciatica, HLD, depression  as evidenced by adherence to CM Team Scheduled appointments work with Child psychotherapist to address housing needs related to the management of  HTN, asthma, COPD, LBP with left sided sciatica, HLD, depressionas evidenced by review of EMR and patient or Child psychotherapist report through collaboration with Research officer, trade union, provider, and care team.   Interventions: Evaluation of current treatment plan related to  self management and patient's adherence to plan as established by provider  Asthma: (Status:New goal.) Long Term Goal Discussed the importance of adequate rest and management of fatigue with Asthma Assessed social determinant of health barriers  01/12/23: patient currently attending PULM REHAB  COPD Interventions:  (Status:  New goal.) Long Term Goal Provided instruction about proper use of medications used for management of COPD including inhalers Discussed the importance of adequate rest and management of fatigue with COPD Assessed social determinant of health barriers 01/12/23: patient currently attending PULM REHAB  Hyperlipidemia Interventions:  (Status:  New goal.) Long Term Goal Medication review performed; medication list updated in electronic medical record.  Provider established cholesterol goals reviewed Counseled on importance of regular laboratory monitoring as prescribed Reviewed importance of limiting foods high in cholesterol Assessed social determinant of health barriers   Hypertension Interventions:  (Status:  New goal.) Long Term Goal Last practice recorded BP readings:  BP Readings from Last 3 Encounters:  12/03/22 (!) 142/89  11/04/22 (!) 162/114  02/01/22  120/64   Most recent eGFR/CrCl:  Lab Results  Component Value Date   EGFR 51 (L) 07/23/2022    No components found for: "CRCL"  Evaluation of current treatment plan related to hypertension self management and patient's adherence to plan as established by provider Reviewed medications with patient and discussed importance of compliance Discussed plans with patient for ongoing care management follow up and provided patient with direct contact information for care management team Advised patient, providing education and rationale, to monitor blood pressure daily and record, calling PCP for  findings outside established parameters Reviewed scheduled/upcoming provider appointments including:  Assessed social determinant of health barriers  Patient Goals/Self-Care Activities: Take all medications as prescribed Attend all scheduled provider appointments Call pharmacy for medication refills 3-7 days in advance of running out of medications Perform all self care activities independently  Perform IADL's (shopping, preparing meals, housekeeping, managing finances) independently Call provider office for new concerns or questions  Work with the social worker to address care coordination needs and will continue to work with the clinical team to address health care and disease management related needs 02/15/23:  patient to schedule eye and dental appts when able to.    Follow Up Plan:  The patient has been provided with contact information for the care management team and has been advised to call with any health related questions or concerns.  The care management team will reach out to the patient again over the next 30 business  days.    Long-Range Goal: Establish Plan of Care for Chronic Disease Management Needs   Priority: High  Note:   Timeframe:  Long-Range Goal Priority:  High Start Date:      01/12/23                       Expected End Date:   ongoing                    Follow Up Date 04/12/23   - practice safe sex - schedule appointment for vaccines needed due to my age or health - schedule recommended health tests  - schedule and keep appointment for annual check-up    Why is this important?   Screening tests can find diseases early when they are easier to treat.  Your doctor or nurse will talk with you about which tests are important for you.  Getting shots for common diseases like the flu and shingles will help prevent them.   03/25/23: Completed PULM REHAB.  Recently seen by PULM and PCP.  Continues counseling every week.     Follow Up:  Patient agrees to Care Plan  and Follow-up.  Plan: The Managed Medicaid care management team will reach out to the patient again over the next 30 business  days. and The  Patient has been provided with contact information for the Managed Medicaid care management team and has been advised to call with any health related questions or concerns.  Date/time of next scheduled RN care management/care coordination outreach: 04/12/23 at 0900.

## 2023-03-25 NOTE — Patient Instructions (Signed)
 Visit Information  Larry Maxwell was given information about Medicaid Managed Care team care coordination services as a part of their [place] benefit. Larry Maxwell verbally consented to engagement with the [facility] Managed Care team.   If you are experiencing a medical emergency, please call 911 or report to your local emergency department or urgent care.   If you have a non-emergency medical problem during routine business hours, please contact your provider's office and ask to speak with a nurse.   For questions related to your [place] health plan, please call: [phone]  If you would like to schedule transportation through your [place] plan, please call the following number at least 2 days in advance of your appointment: [phone].   There is no limit to the number of trips during the year between medical appointments, healthcare facilities, or pharmacies. Transportation must be scheduled at least 2 business days before but not more than thirty 30 days before of your appointment.  Call the Behavioral Health Crisis Line at [phone], at any time, 24 hours a day, 7 days a week. If you are in danger or need immediate medical attention call 911.  If you would like help to quit smoking, call 1-800-QUIT-NOW ((551)287-1847) OR Espaol: 1-855-Djelo-Ya (4-132-440-1027) o para ms informacin haga clic aqu or Text READY to 253-664 to register via text  Larry Maxwell - following are the goals we discussed in your visit today:   Timeframe:  Long-Range Goal Priority:  High Start Date:      01/12/23                       Expected End Date:   ongoing                    Follow Up Date 04/12/23   - practice safe sex - schedule appointment for vaccines needed due to my age or health - schedule recommended health tests  - schedule and keep appointment for annual check-up    Why is this important?   Screening tests can find  diseases early when they are easier to treat.  Your doctor or nurse will talk with you about which tests are important for you.  Getting shots for common diseases like the flu and shingles will help prevent them.   03/25/23: Completed PULM REHAB.  Recently seen by PULM and PCP.  Continues counseling every week.    Patient verbalizes understanding of instructions and care plan provided today and agrees to view in MyChart. Active MyChart status and patient understanding of how to access instructions and care plan via MyChart confirmed with patient.     The Managed Medicaid care management team will reach out to the patient again over the next 30 business  days.  The  Patient  has been provided with contact information for the Managed Medicaid care management team and has been advised to call with any health related questions or concerns.   Kathi Der RN, BSN, [employer] [facility] RN Care Manager Direct Dial [phone]/GLO [phone] Website: [email or web address]   Following is a copy of your plan of care:  Care Plan : RN Care Manager Plan of Care  Updates made by Danie Chandler, RN since 03/25/2023 12:00 AM     Problem: Health Promotion or Disease Self-Management (General Plan of Care)      Long-Range Goal: Chronic Disease Management and Care Coordination Needs   Start Date: 01/12/2023  Expected End Date: 06/22/2023  Priority: High  Note:   Current Barriers:  Knowledge Deficits related to plan of care for management of HTN, asthma, COPD, LBP with left sided sciatica, HLD, depression Care Coordination needs related to housing resources Chronic Disease Management support and education needs related to HTN, asthma, COPD, LBP with left sided sciatica, HLD, depression 03/25/23:  No complaints today-moved to new apartment last week and is going well.  Finished PULM REHAB in January and feeling well.  Not checking BP.  Disability approved.    RNCM Clinical Goal(s):   verbalize understanding of plan for management of  HTN, asthma, COPD, LBP with left sided sciatica, HLD, depressionas evidenced by patient report. verbalize basic understanding of  HTN, asthma, COPD, LBP with left sided sciatica, HLD, depression  disease process and self health management plan as evidenced by patient report. take all medications exactly as prescribed and will call provider for medication related questions as evidenced by patient report. demonstrate understanding of rationale for each prescribed medication as evidenced by patient report. attend all scheduled medical appointments: as evidenced by patient report and EMR review. demonstrate ongoing  adherence to prescribed treatment plan for HTN, asthma, COPD, LBP with left sided sciatica, HLD, depression  as evidenced by patient report and EMR review. continue to work with RN Care Manager to address care management and care coordination needs related to HTN, asthma, COPD, LBP with left sided sciatica, HLD, depression  as evidenced by adherence to CM Team Scheduled appointments work with Child psychotherapist to address housing needs related to the management of  HTN, asthma, COPD, LBP with left sided sciatica, HLD, depressionas evidenced by review of EMR and patient or Child psychotherapist report through collaboration with Medical illustrator, provider, and care team.   Interventions: Evaluation of current treatment plan related to  self management and patient's adherence to plan as established by provider  Asthma: (Status:New goal.) Long Term Goal Discussed the importance of adequate rest and management of fatigue with Asthma Assessed social determinant of health barriers  01/12/23: patient currently attending PULM REHAB  COPD Interventions:  (Status:  New goal.) Long Term Goal Provided instruction about proper use of medications used for management of COPD including inhalers Discussed the importance of adequate rest and management of fatigue with  COPD Assessed social determinant of health barriers 01/12/23: patient currently attending PULM REHAB  Hyperlipidemia Interventions:  (Status:  New goal.) Long Term Goal Medication review performed; medication list updated in electronic medical record.  Provider established cholesterol goals reviewed Counseled on importance of regular laboratory monitoring as prescribed Reviewed importance of limiting foods high in cholesterol Assessed social determinant of health barriers   Hypertension Interventions:  (Status:  New goal.) Long Term Goal Last practice recorded BP readings:  BP Readings from Last 3 Encounters:  12/03/22 (!) 142/89  11/04/22 (!) 162/114  02/01/22                120/64   Most recent eGFR/CrCl:  Lab Results  Component Value Date   EGFR 51 (L) 07/23/2022    No components found for: "CRCL"  Evaluation of current treatment plan related to hypertension self management and patient's adherence to plan as established by provider Reviewed medications with patient and discussed importance of compliance Discussed plans with patient for ongoing care management follow up and provided patient with direct contact information for care management team Advised patient, providing education and rationale, to monitor blood pressure daily and record, calling PCP for findings  outside established parameters Reviewed scheduled/upcoming provider appointments including:  Assessed social determinant of health barriers  Patient Goals/Self-Care Activities: Take all medications as prescribed Attend all scheduled provider appointments Call pharmacy for medication refills 3-7 days in advance of running out of medications Perform all self care activities independently  Perform IADL's (shopping, preparing meals, housekeeping, managing finances) independently Call provider office for new concerns or questions  Work with the social worker to address care coordination needs and will continue to work  with the clinical team to address health care and disease management related needs 02/15/23:  patient to schedule eye and dental appts when able to.    Follow Up Plan:  The patient has been provided with contact information for the care management team and has been advised to call with any health related questions or concerns.  The care management team will reach out to the patient again over the next 30 business  days.

## 2023-03-26 ENCOUNTER — Other Ambulatory Visit: Payer: Self-pay | Admitting: Family Medicine

## 2023-03-26 ENCOUNTER — Other Ambulatory Visit (HOSPITAL_COMMUNITY): Payer: Self-pay

## 2023-03-26 DIAGNOSIS — R202 Paresthesia of skin: Secondary | ICD-10-CM

## 2023-03-26 DIAGNOSIS — J449 Chronic obstructive pulmonary disease, unspecified: Secondary | ICD-10-CM

## 2023-03-26 DIAGNOSIS — G8929 Other chronic pain: Secondary | ICD-10-CM

## 2023-03-26 MED FILL — Methocarbamol Tab 500 MG: ORAL | 5 days supply | Qty: 20 | Fill #0 | Status: CN

## 2023-03-28 ENCOUNTER — Other Ambulatory Visit (HOSPITAL_COMMUNITY): Payer: Self-pay

## 2023-03-28 ENCOUNTER — Telehealth: Payer: Self-pay

## 2023-03-28 MED ORDER — CELECOXIB 100 MG PO CAPS
100.0000 mg | ORAL_CAPSULE | Freq: Two times a day (BID) | ORAL | 0 refills | Status: DC
  Filled 2023-03-28 – 2023-03-31 (×3): qty 180, 90d supply, fill #0

## 2023-03-28 MED ORDER — CHLORTHALIDONE 25 MG PO TABS
25.0000 mg | ORAL_TABLET | Freq: Every day | ORAL | 0 refills | Status: DC
  Filled 2023-03-28 – 2023-04-08 (×3): qty 90, 90d supply, fill #0

## 2023-03-28 MED ORDER — GABAPENTIN 300 MG PO CAPS
ORAL_CAPSULE | ORAL | 0 refills | Status: DC
  Filled 2023-03-28 – 2023-03-31 (×3): qty 360, 90d supply, fill #0

## 2023-03-28 MED ORDER — MONTELUKAST SODIUM 10 MG PO TABS
10.0000 mg | ORAL_TABLET | Freq: Every day | ORAL | 0 refills | Status: DC
  Filled 2023-03-28 – 2023-03-31 (×3): qty 90, 90d supply, fill #0

## 2023-03-28 NOTE — Telephone Encounter (Signed)
Colonoscopy has been canceled with Endo.  Thanks,  Draco Malczewski, CMA 

## 2023-03-28 NOTE — Telephone Encounter (Signed)
 Pt called to cancel procedure for 03/042025

## 2023-03-28 NOTE — Telephone Encounter (Signed)
 Requested Prescriptions  Pending Prescriptions Disp Refills   chlorthalidone (HYGROTON) 25 MG tablet 90 tablet 0    Sig: Take 1 tablet (25 mg total) by mouth daily.     Cardiovascular: Diuretics - Thiazide Passed - 03/28/2023  3:49 PM      Passed - Cr in normal range and within 180 days    Creatinine, Ser  Date Value Ref Range Status  02/17/2023 1.16 0.61 - 1.24 mg/dL Final         Passed - K in normal range and within 180 days    Potassium  Date Value Ref Range Status  02/17/2023 3.8 3.5 - 5.1 mmol/L Final         Passed - Na in normal range and within 180 days    Sodium  Date Value Ref Range Status  02/17/2023 135 135 - 145 mmol/L Final  07/23/2022 135 134 - 144 mmol/L Final         Passed - Last BP in normal range    BP Readings from Last 1 Encounters:  02/16/23 118/78         Passed - Valid encounter within last 6 months    Recent Outpatient Visits           1 month ago Primary hypertension   [place] [facility] [place], Monico Blitz, DO   3 months ago Primary hypertension   [place] [facility] [place], Monico Blitz, DO   4 months ago Stage 4 very severe COPD by GOLD classification [facility])   [place] [facility] [place], Monico Blitz, DO   5 months ago Chest pain with high risk of acute coronary syndrome   [facility] [place], Monico Blitz, DO   7 months ago Restrictive lung disease   [facility] [place], Monico Blitz, DO       Future Appointments             In 2 weeks [place], Monico Blitz, DO [place] [facility], [facility]   In 1 month [place], [place], MD [facility] Pulmonary Care at [place] [facility]

## 2023-03-28 NOTE — Telephone Encounter (Signed)
 Requested Prescriptions  Pending Prescriptions Disp Refills   celecoxib (CELEBREX) 100 MG capsule 180 capsule 0    Sig: Take 1 capsule (100 mg total) by mouth 2 (two) times daily.     Analgesics:  COX2 Inhibitors Failed - 03/28/2023  3:41 PM      Failed - Manual Review: Labs are only required if the patient has taken medication for more than 8 weeks.      Passed - HGB in normal range and within 360 days    Hemoglobin  Date Value Ref Range Status  02/17/2023 14.9 13.0 - 17.0 g/dL Final         Passed - Cr in normal range and within 360 days    Creatinine, Ser  Date Value Ref Range Status  02/17/2023 1.16 0.61 - 1.24 mg/dL Final         Passed - HCT in normal range and within 360 days    HCT  Date Value Ref Range Status  02/17/2023 43.4 39.0 - 52.0 % Final         Passed - AST in normal range and within 360 days    AST  Date Value Ref Range Status  02/17/2023 24 15 - 41 U/L Final         Passed - ALT in normal range and within 360 days    ALT  Date Value Ref Range Status  02/17/2023 20 0 - 44 U/L Final         Passed - eGFR is 30 or above and within 360 days    GFR, Estimated  Date Value Ref Range Status  02/17/2023 >60 >60 mL/min Final    Comment:    (NOTE) Calculated using the CKD-EPI Creatinine Equation (2021)    eGFR  Date Value Ref Range Status  07/23/2022 51 (L) >59 mL/min/1.73 Final         Passed - Patient is not pregnant      Passed - Valid encounter within last 12 months    Recent Outpatient Visits           1 month ago Primary hypertension   [place] [facility] [place], Monico Blitz, DO   3 months ago Primary hypertension   [place] [facility] [place], Monico Blitz, DO   4 months ago Stage 4 very severe COPD by GOLD classification [facility])   [place] [facility] [place], Monico Blitz, DO   5 months ago Chest pain with high risk of acute coronary syndrome   [facility] [place], Monico Blitz, DO   7 months ago Restrictive lung disease   [place] [facility] [place], Monico Blitz, DO       Future Appointments             In 2 weeks [place], Monico Blitz, DO [place] [facility], [facility]   In 1 month [place]aker, [place], MD [place] [facility]monary Care at [place]             montelukast (SINGULAIR) 10 MG tablet 90 tablet 0    Sig: Take 1 tablet (10 mg total) by mouth at bedtime.     Pulmonology:  Leukotriene Inhibitors Passed - 03/28/2023  3:41 PM      Passed - Valid encounter within last 12 months    Recent Outpatient Visits           1 month ago Primary hypertension   [place] [facility] [place], Monico Blitz, DO   3 months ago Primary hypertension   [place] [facility] [place], Monico Blitz, DO   4 months ago Stage 4 very severe COPD by GOLD classification [facility])   [place] [facility] [place], Monico Blitz, DO   5 months ago Chest pain with high risk of acute coronary syndrome   [facility] [place], Monico Blitz, DO   7 months ago Restrictive lung disease   [place] [facility] [place], Monico Blitz, DO       Future Appointments             In 2 weeks [place], Monico Blitz, DO [place] [facility], [facility]   In 1 month [place], [place], MD [place] [facility] at [facility]             gabapentin (NEURONTIN) 300 MG capsule 360 capsule 0    Sig: Take 1 capsule (300 mg total) by mouth in the morning AND 1 capsule (300 mg total) daily AND 2 capsules (600 mg total) at bedtime.     Neurology: Anticonvulsants - gabapentin Passed - 03/28/2023  3:41 PM      Passed - Cr in normal range and within 360 days    Creatinine, Ser  Date Value Ref Range Status  02/17/2023 1.16 0.61 - 1.24 mg/dL Final         Passed - Completed PHQ-2 or PHQ-9 in the last 360 days      Passed - Valid encounter within last 12 months    Recent Outpatient  Visits           1 month ago Primary hypertension   [place] [facility] [place], Monico Blitz, DO   3 months ago Primary hypertension   [place] [facility] [place], Monico Blitz, DO   4 months ago Stage 4 very severe COPD by GOLD classification [facility])   [place] [facility] [place], Monico Blitz, DO   5 months ago Chest pain with high risk of acute coronary syndrome   [facility], DO   7 months ago Restrictive lung disease   [facility] [place], Monico Blitz, DO       Future Appointments             In 2 weeks [place], Monico Blitz, DO [place] [facility], [facility]   In 1 month [place], [place], MD [facility] at [facility]

## 2023-03-29 ENCOUNTER — Other Ambulatory Visit (HOSPITAL_COMMUNITY): Payer: Self-pay

## 2023-03-29 ENCOUNTER — Ambulatory Visit: Admission: RE | Admit: 2023-03-29 | Payer: MEDICAID | Source: Home / Self Care | Admitting: Gastroenterology

## 2023-03-29 SURGERY — COLONOSCOPY WITH PROPOFOL
Anesthesia: General

## 2023-03-29 MED ORDER — LORATADINE 10 MG PO TABS
10.0000 mg | ORAL_TABLET | Freq: Every day | ORAL | 3 refills | Status: AC
  Filled 2023-03-29 – 2023-03-31 (×2): qty 30, 30d supply, fill #0
  Filled 2023-05-05: qty 30, 30d supply, fill #1
  Filled 2023-07-07: qty 30, 30d supply, fill #2
  Filled 2023-08-05 – 2023-10-02 (×2): qty 30, 30d supply, fill #3
  Filled 2024-02-03: qty 30, 30d supply, fill #4
  Filled 2024-02-12: qty 30, 30d supply, fill #5

## 2023-03-31 ENCOUNTER — Other Ambulatory Visit: Payer: Self-pay

## 2023-03-31 MED FILL — Methocarbamol Tab 500 MG: ORAL | 5 days supply | Qty: 20 | Fill #0 | Status: AC

## 2023-03-31 MED FILL — Methocarbamol Tab 500 MG: ORAL | 5 days supply | Qty: 20 | Fill #0 | Status: CN

## 2023-04-01 ENCOUNTER — Other Ambulatory Visit: Payer: Self-pay

## 2023-04-04 ENCOUNTER — Other Ambulatory Visit: Payer: Self-pay

## 2023-04-04 ENCOUNTER — Telehealth: Payer: Self-pay

## 2023-04-04 ENCOUNTER — Other Ambulatory Visit (HOSPITAL_COMMUNITY): Payer: Self-pay

## 2023-04-04 NOTE — Telephone Encounter (Signed)
*  Pulm  Pharmacy Patient Advocate Encounter   Received notification from CoverMyMeds that prior authorization for Breztri Aerosphere is required/requested.   Insurance verification completed.   The patient is insured through [employer] .   Per test claim:  Advair Diskus, Advair HFA, Dulera, Symbicort is preferred by the insurance.  If suggested medication is appropriate, Please send in a new RX and discontinue this one. If not, please advise as to why it's not appropriate so that we may request a Prior Authorization. Please note, some preferred medications may still require a PA.  If the suggested medications have not been trialed and there are no contraindications to their use, the PA will not be submitted, as it will not be approved.   *per plan patient must try 2 of the preferred alternatives- please advise.

## 2023-04-06 ENCOUNTER — Ambulatory Visit: Payer: MEDICAID | Admitting: Clinical

## 2023-04-06 ENCOUNTER — Other Ambulatory Visit (HOSPITAL_COMMUNITY): Payer: Self-pay

## 2023-04-06 DIAGNOSIS — F323 Major depressive disorder, single episode, severe with psychotic features: Secondary | ICD-10-CM | POA: Diagnosis not present

## 2023-04-06 NOTE — Progress Notes (Signed)
   Doree Barthel, LCSW

## 2023-04-06 NOTE — Progress Notes (Signed)
 [facility] Counselor/Therapist Progress Note  Patient ID: Larry Maxwell, MRN: [id number],    Date: 04/06/2023  Time Spent: 8:36am - 9:27am : 51 minutes   Treatment Type: Individual Therapy  Reported Symptoms: none reported  Mental Status Exam: Appearance:  Neat and Well Groomed     Behavior: Appropriate  Motor: Normal  Speech/Language:  Clear and Coherent and Normal Rate  Affect: Appropriate  Mood: normal  Thought process: normal  Thought content:   WNL  Sensory/Perceptual disturbances:   Patient reported he is currently experiencing a toothache  Orientation: oriented to person, place, and situation  Attention: Good  Concentration: Good  Memory: WNL  Fund of knowledge:  Good  Insight:   Good  Judgment:  Good  Impulse Control: Good   Risk Assessment: Danger to Self:  No Patient denied current suicidal ideation  Self-injurious Behavior: No Danger to Others: No Patient denied current homicidal ideation Duty to Warn:no Physical Aggression / Violence:No  Access to Firearms a concern: No  Gang Involvement:No   Subjective: Patient reported he has a toothache and reported he has been experiencing pain associated with the tooth ache since last Thursday. Patient reported he plans to contact the department of social services to inquire about a local dentist. Patient stated, "everything's going good". Patient reported he has been going to the gym. Patient reported several of his nieces and nephews recently visited with patient and patient enjoyed their visit. Patient reported he has not experienced symptoms of panic attacks or depressive symptoms since last session. Patient stated, "I just stay happy all the time for some reason now", "I feel good". Patient reported obtaining his apartment contributed to a decrease in depressive symptoms. Patient stated, "having my own space I feel free, I have space to move around", "I am so grateful and thankful I got my own place".   Patient reported he has plans to celebrate his birthday this weekend with friends. Patient stated, "I'm trying to get out to socialize with more folks". Patient reported he felt isolated in his room while patient was staying with his sister. Patient stated, "I believe that's what triggered a lot of my depression because I was isolated in one space and I didn't have anyone I could talk to". Patient stated, "I try to find something to do everyday". Patient reported he cleans his home daily, is going to the gym, walks in his neighborhood or to a local store. Patient stated, "I feel a whole lot better", "I just feel good".  Patient reported there is a park close to patient's apartment and he plans to reach out to the parks and recreation department to inquire about activities at the park. Patient reported he would like to join a softball team or a bowling league. Psycho education related to enjoyable activities maintaining stabilizing in mood.   Interventions: Cognitive Behavioral Therapy. Clinician conducted session in person at clinician's office at [facility]. Reviewed events since last session and assessed for changes. Assessed patient's activity level and participation in enjoyable activities since last session. Assessed intensity and frequency of symptoms of depression and anxiety. Assessed patient's mood. Explored and identified triggers for recent improvement in mood. Explored and identified triggers for depressive symptoms. Discussed additional opportunities to increase participation in enjoyable activities and socialization. Provided psycho education related to depression and maintaining stabilizing in mood.  Clinician requested for homework patient contact the local parks and recreation department to inquire about local activities offered through the parks and recreation department.  Clinician requested for homework patient continue thought record and gratitude exercise daily.    Collaboration of  Care: Other not required at this time   Diagnosis:  Major depressive disorder, single episode, severe with psychotic features (HCC)     Plan: Patient is to utilize [employer] Therapy, thought re-framing, mindfulness, healthy communication, and coping strategies to decrease symptoms associated with their diagnosis. Frequency: weekly  Modality: individual      Long-term goal:   Reduce overall level, frequency, and intensity of the feelings of depression as evidenced by decreased isolation, depressed mood, lack of appetite, difficulty falling asleep and staying asleep, lack of energy, loss of interest, and difficulty focusing from 7 days/week to 0 to 1 days/week per patient report for at least 3 consecutive months. Target Date: 01/05/24  Progress: progressing    Short-term goal:  Identify triggers for depressive symptoms Target Date: 07/06/23  Progress: progressing    Develop and implement coping strategies to utilize in response to depressive symptoms Target Date: 07/06/23  Progress: progressing    Increase participation in enjoyable activities from 3 days per week to 5 day per week Target Date: 07/06/23  Progress: progressing    patient will verbalize positive statements regarding self and his ability to cope with life stressors Target Date: 07/06/23  Progress: established 01/05/23    Verbalize patient's thoughts and feelings to through effective communication strategies per patient's report Target Date: 07/06/23  Progress: progressing    Identify, challenge, and replace negative thought patterns and negative self talk that contribute to feelings of depression with positive thoughts, beliefs, and positive self talk per patient's report Target Date: 07/06/23  Progress: progressing    Doree Barthel, LCSW

## 2023-04-06 NOTE — Telephone Encounter (Signed)
 Advair Diskus, Advair HFA, Dulera, Symbicort is preferred by the insurance.  If suggested medication is appropriate, Please send in a new RX and discontinue this one. If not, please advise as to why it's not appropriate so that we may request a Prior Authorization. Please note, some preferred medications may still require a PA.  If the suggested medications have not been trialed and there are no contraindications to their use, the PA will not be submitted, as it will not be approved.    *per plan patient must try 2 of the preferred alternatives- please advise.

## 2023-04-08 ENCOUNTER — Other Ambulatory Visit: Payer: Self-pay | Admitting: Family Medicine

## 2023-04-08 ENCOUNTER — Other Ambulatory Visit: Payer: Self-pay

## 2023-04-08 DIAGNOSIS — R053 Chronic cough: Secondary | ICD-10-CM

## 2023-04-08 DIAGNOSIS — J449 Chronic obstructive pulmonary disease, unspecified: Secondary | ICD-10-CM

## 2023-04-08 MED ORDER — BENZONATATE 100 MG PO CAPS
100.0000 mg | ORAL_CAPSULE | Freq: Two times a day (BID) | ORAL | 0 refills | Status: AC | PRN
  Filled 2023-04-08: qty 20, 10d supply, fill #0

## 2023-04-08 NOTE — Telephone Encounter (Signed)
 I will answer "NO" to the question insurance is asking if patient has tried preferred alternatives. PA has now been submitted and is pending determination under CMM Key: [id number]

## 2023-04-08 NOTE — Telephone Encounter (Signed)
 Per Message from Dr. Larinda Buttery-  This patient has severe COPD Stage III gp B and is recommended to be on triple inhaler therapy with breztri.  Thanks

## 2023-04-11 NOTE — Telephone Encounter (Signed)
 Pharmacy Patient Advocate Encounter  Received notification from [facility] that Prior Authorization for Larry Maxwell  has been DENIED.  Full denial letter will be uploaded to the media tab. See denial reason below.

## 2023-04-12 ENCOUNTER — Other Ambulatory Visit: Payer: Self-pay | Admitting: Obstetrics and Gynecology

## 2023-04-12 NOTE — Patient Outreach (Signed)
 Medicaid Managed Care   Nurse Care Manager Note  04/12/2023 Name:  Larry Maxwell MRN:  [id number] DOB:  Dec 04, 1965  Larry Maxwell is an 58 y.o. year old male who is a primary patient of Sherlyn Hay, DO.  [facility] Managed Care Coordination team was consulted for assistance with:    Chronic healthcare management needs, HTN, asthma, COPD, LBP with sciatica, left side, HLD, depression/anxiety  Larry Maxwell was given information about Medicaid Managed Care Coordination team services today. Larry Maxwell Patient agreed to services and verbal consent obtained.  Engaged with patient by telephone for follow up visit in response to provider referral for case management and/or care coordination services.   Patient is participating in a Managed Medicaid Plan:  Yes  Assessments/Interventions:  Review of past medical history, allergies, medications, health status, including review of consultants reports, laboratory and other test data, was performed as part of comprehensive evaluation and provision of chronic care management services.  SDOH (Social Drivers of Health) assessments and interventions performed: SDOH Interventions    Flowsheet Row Patient Outreach Telephone from 04/12/2023 in [place] POPULATION HEALTH DEPARTMENT Most recent reading at 04/12/2023  9:24 AM Patient Outreach Telephone from 03/25/2023 in [place] POPULATION HEALTH DEPARTMENT Most recent reading at 03/25/2023  1:43 PM Patient Outreach Telephone from 03/18/2023 in [place] POPULATION HEALTH DEPARTMENT Most recent reading at 03/18/2023 12:36 PM Patient Outreach Telephone from 02/15/2023 in [place] POPULATION HEALTH DEPARTMENT Most recent reading at 02/15/2023 12:36 PM Pulmonary Rehab from 02/15/2023 in [facility] Cardiac and Pulmonary Rehab Most recent reading at 02/15/2023  7:42 AM Patient Outreach Telephone from 02/11/2023 in  POPULATION HEALTH DEPARTMENT Most recent reading at 02/11/2023  2:01 PM  SDOH  Interventions        Food Insecurity Interventions -- Intervention Not Indicated, Other (Comment)  [patient has food stamps and food insecurity is not an issue for him right now per patient-he has what he needs] -- -- -- --  Transportation Interventions -- -- -- -- -- Payor Benefit  [Modicare number emailed to pt]  Utilities Interventions -- -- Intervention Not Indicated -- -- --  Alcohol Usage Interventions -- -- Intervention Not Indicated (Score <7) -- -- --  Depression Interventions/Treatment  -- -- -- -- Medication --  Physical Activity Interventions Intervention Not Indicated -- -- -- -- --  Stress Interventions Intervention Not Indicated -- -- -- -- Programmer, applications Provided, Provide Counseling  Social Connections Interventions -- Intervention Not Indicated -- -- -- --  Health Literacy Interventions -- -- -- Intervention Not Indicated -- --     Care Plan Allergies  Allergen Reactions   Lisinopril Swelling    Angioedema and significant swelling in ankles as well   Medications Reviewed Today     Reviewed by Danie Chandler, RN (Registered Nurse) on 04/12/23 at [phone]  Med List Status: <None>   Medication Order Taking? Sig Documenting Provider Last Dose Status Informant  albuterol (VENTOLIN HFA) 108 (90 Base) MCG/ACT inhaler [id number] No Inhale 2 puffs into the lungs every 6 (six) hours as needed for wheezing or shortness of breath. Sherlyn Hay, DO Taking Active   amLODipine (NORVASC) 10 MG tablet [id number] No Take 1 tablet (10 mg total) by mouth daily for 30 doses. Sherlyn Hay, DO Taking Active   atorvastatin (LIPITOR) 20 MG tablet [id number] No Take by mouth. [provider] Taking Active   benzonatate (TESSALON) 100 MG capsule [id number]  Take 1 capsule (100 mg total)  by mouth 2 (two) times daily as needed for cough. Sherlyn Hay, DO  Active   Budeson-Glycopyrrol-Formoterol (BREZTRI AEROSPHERE) 160-9-4.8 MCG/ACT AERO [id number] No Inhale 2 puffs into the lungs in the  morning and at bedtime.  Patient not taking: Reported on 02/16/2023   [provider] Not Taking Active   Budeson-Glycopyrrol-Formoterol (BREZTRI AEROSPHERE) 160-9-4.8 MCG/ACT AERO [id number]  Inhale 2 puffs into the lungs in the morning and at bedtime. Janann Colonel, MD  Active   budeson-glycopyrrolate-formoterol (BREZTRI AEROSPHERE) 160-9-4.8 MCG/ACT Sandrea Matte [id number]  Inhale 2 puffs into the lungs in the morning and at bedtime. Janann Colonel, MD  Active   celecoxib (CELEBREX) 100 MG capsule [id number]  Take 1 capsule (100 mg total) by mouth 2 (two) times daily. Sherlyn Hay, DO  Active   chlorthalidone (HYGROTON) 25 MG tablet [id number]  Take 1 tablet (25 mg total) by mouth daily. Sherlyn Hay, DO  Active   DULoxetine (CYMBALTA) 60 MG capsule [id number] No Take 1 tablet by mouth daily. [provider] Taking Active   gabapentin (NEURONTIN) 300 MG capsule [id number]  Take 1 capsule (300 mg total) by mouth in the morning AND 1 capsule (300 mg total) daily AND 2 capsules (600 mg total) at bedtime. Sherlyn Hay, DO  Active   ipratropium-albuterol (DUONEB) 0.5-2.5 (3) MG/3ML SOLN [id number] No Take 3 mLs by nebulization every 6 (six) hours as needed. Assaker, [place], MD Taking Active   loratadine (CLARITIN) 10 MG tablet [id number]  Take 1 tablet (10 mg total) by mouth daily. Sherlyn Hay, DO  Active   methocarbamol (ROBAXIN) 500 MG tablet [id number] No Take 1 tablet (500 mg total) by mouth every 6 (six) hours as needed for muscle spasms. Sherlyn Hay, DO Taking Active   montelukast (SINGULAIR) 10 MG tablet [id number]  Take 1 tablet (10 mg total) by mouth at bedtime. Sherlyn Hay, DO  Active   naloxone [facility]) nasal spray 4 mg/0.1 mL [id number] No Place into the nose. [provider] Taking Active   omeprazole (PRILOSEC) 20 MG capsule [id number] No Take 1 capsule (20 mg total) by mouth daily. Sherlyn Hay, DO Taking Active   rosuvastatin (CRESTOR) 10  MG tablet [id number] No Take 1 tablet (10 mg total) by mouth daily. Sherlyn Hay, DO Taking Active   sertraline (ZOLOFT) 50 MG tablet [id number] No Take 1 tablet (50 mg total) by mouth daily. Sherlyn Hay, DO Taking Active   Spacer/Aero-Holding Chambers (AEROCHAMBER MINI [place]) DEVI [id number]  Use as directed Assaker, [place], MD  Active   valsartan (DIOVAN) 80 MG tablet [id number] No Take 1 tablet (80 mg total) by mouth daily. Sherlyn Hay, DO Taking Active            Patient Active Problem List   Diagnosis Date Noted   Blood glucose elevated 02/02/2023   Epigastric discomfort 02/02/2023   History of pancreatitis 02/02/2023   Chronic kidney disease, stage 3a (HCC) 02/02/2023   Insomnia due to medical condition 12/15/2022   Mixed restrictive and obstructive lung disease (HCC) 10/25/2022   Asthma-COPD overlap syndrome (HCC) 10/25/2022   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) 10/25/2022   Restrictive lung disease 08/25/2022   Paresthesia of right upper extremity 08/17/2022   History of diverticulosis 08/09/2022   Nicotine dependence with current use 08/04/2022   Prediabetes 08/02/2022   Hypertension 07/21/2022   Asthma, allergic, mild persistent, uncomplicated 07/21/2022   Depression, major, single episode, moderate (HCC) 07/21/2022  Mixed hyperlipidemia 07/21/2022   Chronic midline low back pain with left-sided sciatica 07/21/2022   Conditions to be addressed/monitored per PCP order:  Chronic healthcare management needs, HTN, asthma, COPD, LBP with sciatica, left side, HLD, depression/anxiety  Care Plan : RN Care Manager Plan of Care  Updates made by Danie Chandler, RN since 04/12/2023 12:00 AM     Problem: Health Promotion or Disease Self-Management (General Plan of Care)      Long-Range Goal: Chronic Disease Management and Care Coordination Needs   Start Date: 01/12/2023  Expected End Date: 06/22/2023  Priority: High  Note:   Current Barriers:   Knowledge Deficits related to plan of care for management of HTN, asthma, COPD, LBP with left sided sciatica, HLD, depression Care Coordination needs related to housing resources Chronic Disease Management support and education needs related to HTN, asthma, COPD, LBP with left sided sciatica, HLD, depression 04/12/23:  patient needs to see a dentist-loose tooth and painful. BSW appt scheduled for tomorrow.  No BP check.  Breathing WNL.  Continues counseling.  Recent PULM and PCP appt.  Going to gym everyday.  RNCM Clinical Goal(s):  verbalize understanding of plan for management of  HTN, asthma, COPD, LBP with left sided sciatica, HLD, depressionas evidenced by patient report. verbalize basic understanding of  HTN, asthma, COPD, LBP with left sided sciatica, HLD, depression  disease process and self health management plan as evidenced by patient report. take all medications exactly as prescribed and will call provider for medication related questions as evidenced by patient report. demonstrate understanding of rationale for each prescribed medication as evidenced by patient report. attend all scheduled medical appointments: as evidenced by patient report and EMR review. demonstrate ongoing  adherence to prescribed treatment plan for HTN, asthma, COPD, LBP with left sided sciatica, HLD, depression  as evidenced by patient report and EMR review. continue to work with RN Care Manager to address care management and care coordination needs related to HTN, asthma, COPD, LBP with left sided sciatica, HLD, depression  as evidenced by adherence to CM Team Scheduled appointments work with Child psychotherapist to address housing needs related to the management of  HTN, asthma, COPD, LBP with left sided sciatica, HLD, depressionas evidenced by review of EMR and patient or Child psychotherapist report through collaboration with Medical illustrator, provider, and care team.   Interventions: Evaluation of current treatment plan  related to  self management and patient's adherence to plan as established by provider 04/12/23:  Collaborated with BSW for dental resources-appt made for tomorrow.  Asthma: (Status:New goal.) Long Term Goal Discussed the importance of adequate rest and management of fatigue with Asthma Assessed social determinant of health barriers  01/12/23: patient currently attending PULM REHAB  COPD Interventions:  (Status:  New goal.) Long Term Goal Provided instruction about proper use of medications used for management of COPD including inhalers Discussed the importance of adequate rest and management of fatigue with COPD Assessed social determinant of health barriers 01/12/23: patient currently attending PULM REHAB  Hyperlipidemia Interventions:  (Status:  New goal.) Long Term Goal Medication review performed; medication list updated in electronic medical record.  Provider established cholesterol goals reviewed Counseled on importance of regular laboratory monitoring as prescribed Reviewed importance of limiting foods high in cholesterol Assessed social determinant of health barriers   Hypertension Interventions:  (Status:  New goal.) Long Term Goal Last practice recorded BP readings:  BP Readings from Last 3 Encounters:  12/03/22 (!) 142/89  11/04/22 (!) 162/114  02/01/22                120/64   Most recent eGFR/CrCl:  Lab Results  Component Value Date   EGFR 51 (L) 07/23/2022    No components found for: "CRCL"  Evaluation of current treatment plan related to hypertension self management and patient's adherence to plan as established by provider Reviewed medications with patient and discussed importance of compliance Discussed plans with patient for ongoing care management follow up and provided patient with direct contact information for care management team Advised patient, providing education and rationale, to monitor blood pressure daily and record, calling PCP for findings outside  established parameters Reviewed scheduled/upcoming provider appointments including:  Assessed social determinant of health barriers  Patient Goals/Self-Care Activities: Take all medications as prescribed Attend all scheduled provider appointments Call pharmacy for medication refills 3-7 days in advance of running out of medications Perform all self care activities independently  Perform IADL's (shopping, preparing meals, housekeeping, managing finances) independently Call provider office for new concerns or questions  Work with the social worker to address care coordination needs and will continue to work with the clinical team to address health care and disease management related needs 02/15/23:  patient to schedule eye and dental appts when able to.    Follow Up Plan:  The patient has been provided with contact information for the care management team and has been advised to call with any health related questions or concerns.  The care management team will reach out to the patient again over the next 45 business  days.    Long-Range Goal: Establish Plan of Care for Chronic Disease Management Needs   Priority: High  Note:   Timeframe:  Long-Range Goal Priority:  High Start Date:      01/12/23                       Expected End Date:   ongoing                    Follow Up Date 05/25/23   - practice safe sex - schedule appointment for vaccines needed due to my age or health - schedule recommended health tests  - schedule and keep appointment for annual check-up    Why is this important?   Screening tests can find diseases early when they are easier to treat.  Your doctor or nurse will talk with you about which tests are important for you.  Getting shots for common diseases like the flu and shingles will help prevent them.   04/12/23: Counseling every week.  Recent PULM and PCP appts.  Needs to see dentist-BSW appt scheduled for tomorrow with Jon Gills for resources.   Follow Up:   Patient agrees to Care Plan and Follow-up.  Plan: The Managed Medicaid care management team will reach out to the patient again over the next 45 business  days. and The  Patient has been provided with contact information for the Managed Medicaid care management team and has been advised to call with any health related questions or concerns.  Date/time of next scheduled RN care management/care coordination outreach:  4/30 at 1000.

## 2023-04-12 NOTE — Patient Instructions (Signed)
 Visit Information  Mr. Larry Maxwell was given information about Medicaid Managed Care team care coordination services as a part of their [place] Medicaid benefit. [name] verbally consented to engagement with the [facility] Managed Care team.   If you are experiencing a medical emergency, please call 911 or report to your local emergency department or urgent care.   If you have a non-emergency medical problem during routine business hours, please contact your provider's office and ask to speak with a nurse.   For questions related to your [place] health plan, please call: [phone]  If you would like to schedule transportation through your [place] Medicaid plan, please call the following number at least 2 days in advance of your appointment: [phone].   There is no limit to the number of trips during the year between medical appointments, healthcare facilities, or pharmacies. Transportation must be scheduled at least 2 business days before but not more than thirty 30 days before of your appointment.  Call the Behavioral Health Crisis Line at [phone], at any time, 24 hours a day, 7 days a week. If you are in danger or need immediate medical attention call 911.  If you would like help to quit smoking, call 1-800-QUIT-NOW (337-237-9652) OR Espaol: 1-855-Djelo-Ya (0-347-425-9563) o para ms informacin haga clic aqu or Text READY to 875-643 to register via text  Mr. Larry Maxwell - following are the goals we discussed in your visit today:   Timeframe:  Long-Range Goal Priority:  High Start Date:      01/12/23                       Expected End Date:   ongoing                    Follow Up Date 05/25/23   - practice safe sex - schedule appointment for vaccines needed due to my age or health - schedule recommended health tests  - schedule and keep appointment for annual check-up    Why is this important?   Screening tests can find  diseases early when they are easier to treat.  Your doctor or nurse will talk with you about which tests are important for you.  Getting shots for common diseases like the flu and shingles will help prevent them.   04/12/23: Counseling every week.  Recent PULM and PCP appts.  Needs to see dentist-BSW appt scheduled for tomorrow with Jon Gills for resources.  Patient verbalizes understanding of instructions and care plan provided today and agrees to view in MyChart. Active MyChart status and patient understanding of how to access instructions and care plan via MyChart confirmed with patient.     The Managed Medicaid care management team will reach out to the patient again over the next 45 business  days.  The  Patient has been provided with contact information for the Managed Medicaid care management team and has been advised to call with any health related questions or concerns.   Kathi Der RNC, BSN, [employer] [facility] RN Care Manager Direct Dial [phone] Website: [email or web address]   Following is a copy of your plan of care:  Care Plan : RN Care Manager Plan of Care  Updates made by Danie Chandler, RN since 04/12/2023 12:00 AM     Problem: Health Promotion or Disease Self-Management (General Plan of Care)      Long-Range Goal: Chronic Disease Management and Care Coordination Needs  Start Date: 01/12/2023  Expected End Date: 06/22/2023  Priority: High  Note:   Current Barriers:  Knowledge Deficits related to plan of care for management of HTN, asthma, COPD, LBP with left sided sciatica, HLD, depression Care Coordination needs related to housing resources Chronic Disease Management support and education needs related to HTN, asthma, COPD, LBP with left sided sciatica, HLD, depression 04/12/23:  patient needs to see a dentist-loose tooth and painful. BSW appt scheduled for tomorrow.  No BP check.  Breathing WNL.  Continues counseling.  Recent  PULM and PCP appt.  Going to gym everyday.  RNCM Clinical Goal(s):  verbalize understanding of plan for management of  HTN, asthma, COPD, LBP with left sided sciatica, HLD, depressionas evidenced by patient report. verbalize basic understanding of  HTN, asthma, COPD, LBP with left sided sciatica, HLD, depression  disease process and self health management plan as evidenced by patient report. take all medications exactly as prescribed and will call provider for medication related questions as evidenced by patient report. demonstrate understanding of rationale for each prescribed medication as evidenced by patient report. attend all scheduled medical appointments: as evidenced by patient report and EMR review. demonstrate ongoing  adherence to prescribed treatment plan for HTN, asthma, COPD, LBP with left sided sciatica, HLD, depression  as evidenced by patient report and EMR review. continue to work with RN Care Manager to address care management and care coordination needs related to HTN, asthma, COPD, LBP with left sided sciatica, HLD, depression  as evidenced by adherence to CM Team Scheduled appointments work with Child psychotherapist to address housing needs related to the management of  HTN, asthma, COPD, LBP with left sided sciatica, HLD, depressionas evidenced by review of EMR and patient or Child psychotherapist report through collaboration with Medical illustrator, provider, and care team.   Interventions: Evaluation of current treatment plan related to  self management and patient's adherence to plan as established by provider 04/12/23:  Collaborated with BSW for dental resources-appt made for tomorrow.  Asthma: (Status:New goal.) Long Term Goal Discussed the importance of adequate rest and management of fatigue with Asthma Assessed social determinant of health barriers  01/12/23: patient currently attending PULM REHAB  COPD Interventions:  (Status:  New goal.) Long Term Goal Provided instruction about  proper use of medications used for management of COPD including inhalers Discussed the importance of adequate rest and management of fatigue with COPD Assessed social determinant of health barriers 01/12/23: patient currently attending PULM REHAB  Hyperlipidemia Interventions:  (Status:  New goal.) Long Term Goal Medication review performed; medication list updated in electronic medical record.  Provider established cholesterol goals reviewed Counseled on importance of regular laboratory monitoring as prescribed Reviewed importance of limiting foods high in cholesterol Assessed social determinant of health barriers   Hypertension Interventions:  (Status:  New goal.) Long Term Goal Last practice recorded BP readings:  BP Readings from Last 3 Encounters:  12/03/22 (!) 142/89  11/04/22 (!) 162/114  02/01/22                120/64   Most recent eGFR/CrCl:  Lab Results  Component Value Date   EGFR 51 (L) 07/23/2022    No components found for: "CRCL"  Evaluation of current treatment plan related to hypertension self management and patient's adherence to plan as established by provider Reviewed medications with patient and discussed importance of compliance Discussed plans with patient for ongoing care management follow up and provided patient with direct contact information for  care management team Advised patient, providing education and rationale, to monitor blood pressure daily and record, calling PCP for findings outside established parameters Reviewed scheduled/upcoming provider appointments including:  Assessed social determinant of health barriers  Patient Goals/Self-Care Activities: Take all medications as prescribed Attend all scheduled provider appointments Call pharmacy for medication refills 3-7 days in advance of running out of medications Perform all self care activities independently  Perform IADL's (shopping, preparing meals, housekeeping, managing finances)  independently Call provider office for new concerns or questions  Work with the social worker to address care coordination needs and will continue to work with the clinical team to address health care and disease management related needs 02/15/23:  patient to schedule eye and dental appts when able to.    Follow Up Plan:  The patient has been provided with contact information for the care management team and has been advised to call with any health related questions or concerns.  The care management team will reach out to the patient again over the next 45 business  days.

## 2023-04-13 ENCOUNTER — Encounter: Payer: Self-pay | Admitting: Family Medicine

## 2023-04-13 ENCOUNTER — Other Ambulatory Visit: Payer: Self-pay

## 2023-04-13 ENCOUNTER — Ambulatory Visit (INDEPENDENT_AMBULATORY_CARE_PROVIDER_SITE_OTHER): Payer: MEDICAID | Admitting: Family Medicine

## 2023-04-13 VITALS — BP 135/93 | HR 61 | Temp 97.9°F | Ht 73.5 in | Wt 203.9 lb

## 2023-04-13 DIAGNOSIS — F3341 Major depressive disorder, recurrent, in partial remission: Secondary | ICD-10-CM | POA: Diagnosis not present

## 2023-04-13 DIAGNOSIS — J4489 Other specified chronic obstructive pulmonary disease: Secondary | ICD-10-CM

## 2023-04-13 DIAGNOSIS — M5442 Lumbago with sciatica, left side: Secondary | ICD-10-CM

## 2023-04-13 DIAGNOSIS — Z23 Encounter for immunization: Secondary | ICD-10-CM | POA: Diagnosis not present

## 2023-04-13 DIAGNOSIS — G8929 Other chronic pain: Secondary | ICD-10-CM

## 2023-04-13 DIAGNOSIS — I10 Essential (primary) hypertension: Secondary | ICD-10-CM

## 2023-04-13 DIAGNOSIS — M79605 Pain in left leg: Secondary | ICD-10-CM

## 2023-04-13 DIAGNOSIS — L309 Dermatitis, unspecified: Secondary | ICD-10-CM

## 2023-04-13 MED ORDER — BUDESONIDE-FORMOTEROL FUMARATE 160-4.5 MCG/ACT IN AERO
2.0000 | INHALATION_SPRAY | Freq: Two times a day (BID) | RESPIRATORY_TRACT | 3 refills | Status: DC
  Filled 2023-04-13 – 2023-05-05 (×3): qty 10.2, 30d supply, fill #0

## 2023-04-13 MED ORDER — ALBUTEROL SULFATE HFA 108 (90 BASE) MCG/ACT IN AERS
2.0000 | INHALATION_SPRAY | Freq: Four times a day (QID) | RESPIRATORY_TRACT | 3 refills | Status: AC | PRN
  Filled 2023-04-13: qty 18, 30d supply, fill #0
  Filled 2023-07-07: qty 18, 25d supply, fill #0
  Filled 2023-10-02: qty 18, 25d supply, fill #1
  Filled 2023-10-04 (×2): qty 18, 25d supply, fill #2
  Filled 2023-12-10: qty 6.7, 28d supply, fill #2
  Filled 2024-01-11: qty 6.7, 28d supply, fill #3
  Filled 2024-02-12: qty 18, 25d supply, fill #4

## 2023-04-13 NOTE — Patient Instructions (Signed)
 Tailored Plan Medicaid On July 1, some people on [place] Medicaid will move to a new kind of Medicaid health plan called a Tailored Plan. Tailored Plans cover your doctor visits, prescription drugs, and health care services.    If your [place] Medicaid will move to a Tailored Plan, you should have gotten a letter and welcome packet. If you're not sure, call your [place] Medicaid Enrollment Broker at [phone] and ask.  Check out these free materials, in [place] and [place], to learn more about your Tailored Plan: [email or web address]/Tailored-Plans/Toolkit  Tailored Care Management Services  TCM services are available to you now. If you are a Tailored Plan member or will be and want information about Tailored Care Management Services including rides to appointments and community and home services, call the Care Management provider for your county of residence:    [facility] ([place], [place])  Member Services: [phone] Behavioral Health Crisis Line: 249-390-8468, [place], [place], [place], [place])  Member Services: [phone] Behavioral Health Crisis Line: (214)344-8964  [facility] [facility]) Member Services: [phone] Behavioral Health Crisis Line: 347-676-7363

## 2023-04-13 NOTE — Patient Outreach (Signed)
 Medicaid Managed Care Social Work Note  04/13/2023 Name:  Larry Maxwell MRN:  [id number] DOB:  02-23-1965  Larry Maxwell is an 58 y.o. year old male who is a primary patient of Larry Hay, DO.  The [facility] Managed Care Coordination team was consulted for assistance with:   dental resources  Larry Maxwell was given information about Medicaid Managed Care Coordination team services today. Larry Maxwell Patient agreed to services and verbal consent obtained.  Engaged with patient  for by telephone forfollow up visit in response to referral for case management and/or care coordination services.   Patient is participating in a Managed Medicaid Plan:  Yes  Assessments/Interventions:  Review of past medical history, allergies, medications, health status, including review of consultants reports, laboratory and other test data, was performed as part of comprehensive evaluation and provision of chronic care management services.  SDOH: (Social Drivers of Health) assessments and interventions performed: SDOH Interventions    Flowsheet Row Patient Outreach Telephone from 04/12/2023 in [place] POPULATION HEALTH DEPARTMENT Most recent reading at 04/12/2023  9:24 AM Patient Outreach Telephone from 03/25/2023 in [place] POPULATION HEALTH DEPARTMENT Most recent reading at 03/25/2023  1:43 PM Patient Outreach Telephone from 03/18/2023 in [place] POPULATION HEALTH DEPARTMENT Most recent reading at 03/18/2023 12:36 PM Patient Outreach Telephone from 02/15/2023 in [place] POPULATION HEALTH DEPARTMENT Most recent reading at 02/15/2023 12:36 PM Pulmonary Rehab from 02/15/2023 in [facility] Most recent reading at 02/15/2023  7:42 AM Patient Outreach Telephone from 02/11/2023 in [place] POPULATION HEALTH DEPARTMENT Most recent reading at 02/11/2023  2:01 PM  SDOH Interventions        Food Insecurity Interventions -- Intervention Not Indicated, Other (Comment)  [patient has  food stamps and food insecurity is not an issue for him right now per patient-he has what he needs] -- -- -- --  Transportation Interventions -- -- -- -- -- Payor Benefit  [Modicare number emailed to pt]  Utilities Interventions -- -- Intervention Not Indicated -- -- --  Alcohol Usage Interventions -- -- Intervention Not Indicated (Score <7) -- -- --  Depression Interventions/Treatment  -- -- -- -- Medication --  Physical Activity Interventions Intervention Not Indicated -- -- -- -- --  Stress Interventions Intervention Not Indicated -- -- -- -- [employer] Provided, Provide Counseling  Social Connections Interventions -- Intervention Not Indicated -- -- -- --  Health Literacy Interventions -- -- -- Intervention Not Indicated -- --     BSW completed a telephone outreach with patient, he states he has a tooth that needs to be pulled. Patient states he did not receieve the dental resources that BSW sent him in Jan. BSW provided patient with his insurance information for [facility], BSW resent dental resources to patient email address. No other resources are needed at this time.   Advanced Directives Status:  Not addressed in this encounter.  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) 10/25/2022   Restrictive lung disease 08/25/2022   Paresthesia of right upper extremity  08/17/2022   History of diverticulosis 08/09/2022   Nicotine dependence with current use 08/04/2022   Prediabetes 08/02/2022   Hypertension 07/21/2022   Asthma, allergic, mild persistent, uncomplicated 07/21/2022   Depression, major, single episode, moderate (HCC) 07/21/2022   Mixed hyperlipidemia 07/21/2022   Chronic midline low back pain with left-sided sciatica 07/21/2022    Conditions to be addressed/monitored per PCP order:   dental resources  There are no care plans that you recently modified to display for this patient.   Follow up:  Patient agrees to Care Plan and Follow-up.  Plan: The  Patient has been provided with contact information for the Managed Medicaid care management team and has been advised to call with any health related questions or concerns.    Abelino Derrick, MHA [facility]  Managed [facility] Social Worker [phone]

## 2023-04-13 NOTE — Progress Notes (Signed)
 Established patient visit   Patient: Larry Maxwell   DOB: 1965-02-05   58 y.o. Male  MRN: [id number] Visit Date: 04/13/2023  Today's healthcare provider: Sherlyn Hay, DO   Chief Complaint  Patient presents with   Medical Management of Chronic Issues    10 week follow-up. Patient was to :  -Stop metoprolol (no indications outside of HTN noted) - Continue amlodipine 10 mg - Continue chlorthalidone - Increase valsartan dose to 80 mg daily    Hypertension    Patient reports taking medications as prescribed and tolerating well. Patient reports no symptoms and he does not monitor his bp at home   Care Management    Zoster vaccine consented to receiving final injection   Subjective    HPI Larry Maxwell is a 58 year old male with hypertension who presents for medication management and leg pain.  He did not take his blood pressure medication this morning due to being in a rush. He usually takes his medications at 5:30 AM and is on three blood pressure medications: chlorthalidone, valsartan, and amlodipine. Additionally, he takes rosuvastatin for cholesterol management, sertraline for depression, and montelukast for allergy/COPD management. He uses inhalers and is awaiting a new prescription for Symbicort after a previous denial for [facility].  He describes a new onset of pain in the back of his left leg, where he has broken veins, persisting for about a week. The pain does not change with exercise and is managed with methocarbamol and gabapentin. He exercises regularly, using a treadmill and a bike at the gym. Occasional swelling in his legs and pain in the calf are noted, but no other significant symptoms.  He experiences chest pain, which he attributes to gas from eating onions, and uses Prilosec for this issue. No significant back pain since moving to a new place and sleeping on a proper bed instead of a futon. Gabapentin is taken one in the morning, one midday, and two at night.  Methocarbamol is taken every two to three days as needed.  COPD management includes the use of inhalers and montelukast. Insurance issues have led to denial of some medications. He is awaiting a new prescription for Symbicort after a previous denial for [facility].   He mentions a dry spot on his skin that turns red, which he manages with Vaseline. He has not experienced eczema before.  He was previously on duloxetine and is now on sertraline for depression. There was a lapse in taking sertraline, but it was recently refilled.     Medications: Outpatient Medications Prior to Visit  Medication Sig   amLODipine (NORVASC) 10 MG tablet Take 1 tablet (10 mg total) by mouth daily for 30 doses.   benzonatate (TESSALON) 100 MG capsule Take 1 capsule (100 mg total) by mouth 2 (two) times daily as needed for cough.   celecoxib (CELEBREX) 100 MG capsule Take 1 capsule (100 mg total) by mouth 2 (two) times daily.   chlorthalidone (HYGROTON) 25 MG tablet Take 1 tablet (25 mg total) by mouth daily.   DULoxetine (CYMBALTA) 60 MG capsule Take 1 tablet by mouth daily.   gabapentin (NEURONTIN) 300 MG capsule Take 1 capsule (300 mg total) by mouth in the morning AND 1 capsule (300 mg total) daily AND 2 capsules (600 mg total) at bedtime.   ipratropium-albuterol (DUONEB) 0.5-2.5 (3) MG/3ML SOLN Take 3 mLs by nebulization every 6 (six) hours as needed.   loratadine (CLARITIN) 10 MG tablet Take 1  tablet (10 mg total) by mouth daily.   methocarbamol (ROBAXIN) 500 MG tablet Take 1 tablet (500 mg total) by mouth every 6 (six) hours as needed for muscle spasms.   montelukast (SINGULAIR) 10 MG tablet Take 1 tablet (10 mg total) by mouth at bedtime.   naloxone (NARCAN) nasal spray 4 mg/0.1 mL Place into the nose.   omeprazole (PRILOSEC) 20 MG capsule Take 1 capsule (20 mg total) by mouth daily.   rosuvastatin (CRESTOR) 10 MG tablet Take 1 tablet (10 mg total) by mouth daily.   sertraline (ZOLOFT) 50 MG tablet Take 1  tablet (50 mg total) by mouth daily.   Spacer/Aero-Holding Chambers (AEROCHAMBER MINI CHAMBER) DEVI Use as directed   valsartan (DIOVAN) 80 MG tablet Take 1 tablet (80 mg total) by mouth daily.   [DISCONTINUED] albuterol (VENTOLIN HFA) 108 (90 Base) MCG/ACT inhaler Inhale 2 puffs into the lungs every 6 (six) hours as needed for wheezing or shortness of breath.   [DISCONTINUED] atorvastatin (LIPITOR) 20 MG tablet Take by mouth.   [DISCONTINUED] Budeson-Glycopyrrol-Formoterol (BREZTRI AEROSPHERE) 160-9-4.8 MCG/ACT AERO Inhale 2 puffs into the lungs in the morning and at bedtime.   [DISCONTINUED] Budeson-Glycopyrrol-Formoterol (BREZTRI AEROSPHERE) 160-9-4.8 MCG/ACT AERO Inhale 2 puffs into the lungs in the morning and at bedtime.   [DISCONTINUED] budeson-glycopyrrolate-formoterol (BREZTRI AEROSPHERE) 160-9-4.8 MCG/ACT AERO Inhale 2 puffs into the lungs in the morning and at bedtime.   No facility-administered medications prior to visit.    Review of Systems  Respiratory: Negative.  Negative for cough, shortness of breath and wheezing.   Cardiovascular:  Negative for chest pain, palpitations and leg swelling.  Neurological:  Negative for weakness and headaches.        Objective    BP (!) 135/93 (BP Location: Right Arm, Patient Position: Sitting, Cuff Size: Normal)   Pulse 61   Temp 97.9 F (36.6 C) (Oral)   Ht 6' 1.5" (1.867 m)   Wt 203 lb 14.4 oz (92.5 kg)   SpO2 100%   BMI 26.54 kg/m     Physical Exam Vitals and nursing note reviewed.  Constitutional:      General: He is not in acute distress.    Appearance: Normal appearance.  HENT:     Head: Normocephalic and atraumatic.  Eyes:     General: No scleral icterus.    Conjunctiva/sclera: Conjunctivae normal.  Cardiovascular:     Rate and Rhythm: Normal rate.  Pulmonary:     Effort: Pulmonary effort is normal.  Neurological:     Mental Status: He is alert and oriented to person, place, and time. Mental status is at  baseline.  Psychiatric:        Mood and Affect: Mood normal.        Behavior: Behavior normal.      No results found for any visits on 04/13/23.  Assessment & Plan    Primary hypertension  Asthma-COPD overlap syndrome (HCC) -     Budesonide-Formoterol Fumarate; Inhale 2 puffs into the lungs 2 (two) times daily.  Dispense: 10.2 g; Refill: 3 -     Albuterol Sulfate HFA; Inhale 2 puffs into the lungs every 6 (six) hours as needed for wheezing or shortness of breath.  Dispense: 18 g; Refill: 3  Recurrent major depressive disorder, in partial remission (HCC)  Chronic midline low back pain with left-sided sciatica  Acute leg pain, left  Eczema, unspecified type  Need for shingles vaccine -     Varicella-zoster vaccine IM  Immunization due -  Varicella-zoster vaccine IM     Hypertension Hypertension is managed with chlorthalidone and valsartan. He missed a dose this morning due to time constraints but generally adheres to the medication schedule. - Continue current antihypertensive regimen including chlorthalidone and valsartan - Ensure medication adherence - Monitor blood pressure regularly  Asthma-COPD Overlap Syndrome Asthma/COPD management includes Symbicort and a rescue inhaler (albuterol). Insurance issues previously denied Breztri, but Symbicort is being used now. - Prescribe Symbicort and instruct on regular use - Prescribe albuterol as a rescue inhaler  Major Depression, recurrent He was previously on duloxetine, which was denied, and is now on sertraline. There was a gap in taking sertraline, but it has been refilled recently. - Continue sertraline as prescribed  Chronic Back Pain Chronic back pain has improved since moving to a new residence and using a proper bed. Gabapentin is effective for pain management. - Continue gabapentin as prescribed  Acute left leg pain Acute pain in the back of the left leg, possibly related to varicose veins, has been present  for about a week. Methocarbamol and gabapentin provide effective pain relief. The pain does not worsen with exercise. - Continue methocarbamol and gabapentin as prescribed - Consider acetaminophen for additional pain relief - Ensure proper stretching before exercise  Eczema A dry, red spot on the skin, possibly eczema, is managed with Vaseline. Hydrocortisone cream can be used occasionally if needed. - Continue using Vaseline for moisturizing - Use hydrocortisone cream occasionally if needed    No follow-ups on file.      I discussed the assessment and treatment plan with the patient  The patient was provided an opportunity to ask questions and all were answered. The patient agreed with the plan and demonstrated an understanding of the instructions.   The patient was advised to call back or seek an in-person evaluation if the symptoms worsen or if the condition fails to improve as anticipated.    Sherlyn Hay, DO  [facility] [phone] (phone) [phone] (fax)  [facility]

## 2023-04-15 ENCOUNTER — Telehealth: Payer: Self-pay | Admitting: Pharmacy Technician

## 2023-04-15 ENCOUNTER — Other Ambulatory Visit: Payer: Self-pay

## 2023-04-15 ENCOUNTER — Other Ambulatory Visit (HOSPITAL_COMMUNITY): Payer: Self-pay

## 2023-04-15 NOTE — Telephone Encounter (Signed)
 Pharmacy Patient Advocate Encounter   Received notification from Onbase that prior authorization for BUDESONIDE/FORMOTEROL 160-4.5 MCG/ACT INHALER is required/requested.   Insurance verification completed.   The patient is insured through [employer] [place] .   Per test claim:  BRAND NAME SYMBICORT is preferred by the insurance.  If suggested medication is appropriate, Please send in a new RX and discontinue this one. If not, please advise as to why it's not appropriate so that we may request a Prior Authorization. Please note, some preferred medications may still require a PA.  If the suggested medications have not been trialed and there are no contraindications to their use, the PA will not be submitted, as it will not be approved.

## 2023-04-19 ENCOUNTER — Other Ambulatory Visit: Payer: Self-pay

## 2023-04-20 ENCOUNTER — Ambulatory Visit: Payer: MEDICAID | Admitting: Clinical

## 2023-04-20 ENCOUNTER — Other Ambulatory Visit: Payer: Self-pay

## 2023-04-24 ENCOUNTER — Encounter: Payer: Self-pay | Admitting: Family Medicine

## 2023-04-24 DIAGNOSIS — L309 Dermatitis, unspecified: Secondary | ICD-10-CM | POA: Insufficient documentation

## 2023-04-24 DIAGNOSIS — M79605 Pain in left leg: Secondary | ICD-10-CM | POA: Insufficient documentation

## 2023-04-25 ENCOUNTER — Other Ambulatory Visit: Payer: Self-pay

## 2023-04-27 NOTE — Progress Notes (Unsigned)
 No show

## 2023-05-03 ENCOUNTER — Ambulatory Visit (INDEPENDENT_AMBULATORY_CARE_PROVIDER_SITE_OTHER): Payer: MEDICAID | Admitting: Psychiatry

## 2023-05-03 DIAGNOSIS — Z91199 Patient's noncompliance with other medical treatment and regimen due to unspecified reason: Secondary | ICD-10-CM

## 2023-05-05 ENCOUNTER — Other Ambulatory Visit: Payer: Self-pay

## 2023-05-05 ENCOUNTER — Other Ambulatory Visit: Payer: Self-pay | Admitting: Family Medicine

## 2023-05-05 ENCOUNTER — Other Ambulatory Visit (HOSPITAL_COMMUNITY): Payer: Self-pay

## 2023-05-05 DIAGNOSIS — I10 Essential (primary) hypertension: Secondary | ICD-10-CM

## 2023-05-05 MED ORDER — AMLODIPINE BESYLATE 10 MG PO TABS
10.0000 mg | ORAL_TABLET | Freq: Every day | ORAL | 2 refills | Status: DC
  Filled 2023-05-05 (×3): qty 30, 30d supply, fill #0
  Filled 2023-07-07: qty 30, 30d supply, fill #1
  Filled 2023-08-05 – 2023-10-02 (×2): qty 30, 30d supply, fill #2

## 2023-05-10 ENCOUNTER — Ambulatory Visit (INDEPENDENT_AMBULATORY_CARE_PROVIDER_SITE_OTHER): Payer: MEDICAID | Admitting: Clinical

## 2023-05-10 DIAGNOSIS — F323 Major depressive disorder, single episode, severe with psychotic features: Secondary | ICD-10-CM | POA: Diagnosis not present

## 2023-05-10 NOTE — Progress Notes (Signed)
   Larry Barthel, LCSW
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
## 2023-05-25 ENCOUNTER — Ambulatory Visit (INDEPENDENT_AMBULATORY_CARE_PROVIDER_SITE_OTHER): Payer: MEDICAID | Admitting: Pulmonary Disease

## 2023-05-25 ENCOUNTER — Ambulatory Visit: Payer: MEDICAID

## 2023-05-25 ENCOUNTER — Other Ambulatory Visit: Payer: Self-pay

## 2023-05-25 ENCOUNTER — Encounter: Payer: Self-pay | Admitting: Pulmonary Disease

## 2023-05-25 VITALS — BP 124/84 | HR 53 | Temp 97.1°F | Ht 73.5 in | Wt 209.2 lb

## 2023-05-25 DIAGNOSIS — J439 Emphysema, unspecified: Secondary | ICD-10-CM | POA: Diagnosis not present

## 2023-05-25 DIAGNOSIS — Z87891 Personal history of nicotine dependence: Secondary | ICD-10-CM

## 2023-05-25 DIAGNOSIS — J4489 Other specified chronic obstructive pulmonary disease: Secondary | ICD-10-CM

## 2023-05-25 MED ORDER — INCRUSE ELLIPTA 62.5 MCG/ACT IN AEPB
1.0000 | INHALATION_SPRAY | Freq: Every day | RESPIRATORY_TRACT | 3 refills | Status: AC
  Filled 2023-05-25: qty 90, 90d supply, fill #0
  Filled 2023-07-07 – 2023-10-02 (×2): qty 90, 90d supply, fill #1
  Filled 2023-12-10: qty 90, 90d supply, fill #2
  Filled 2024-01-11 – 2024-02-12 (×3): qty 90, 90d supply, fill #3

## 2023-05-25 MED ORDER — FLUTICASONE-SALMETEROL 230-21 MCG/ACT IN AERO
2.0000 | INHALATION_SPRAY | Freq: Two times a day (BID) | RESPIRATORY_TRACT | 12 refills | Status: AC
  Filled 2023-05-25: qty 12, 30d supply, fill #0
  Filled 2023-07-07: qty 12, 30d supply, fill #1
  Filled 2023-08-05 – 2023-10-02 (×2): qty 12, 30d supply, fill #2
  Filled 2023-12-10: qty 12, 30d supply, fill #3
  Filled 2024-02-03: qty 12, 30d supply, fill #4
  Filled 2024-02-12: qty 12, 30d supply, fill #5

## 2023-05-25 NOTE — Patient Outreach (Signed)
 Complex Care Management   Visit Note  05/26/2023  Name:  Larry Maxwell MRN: [id number] DOB: [birth year]  Situation: Referral received for Complex Care Management related to COPD and HTN  I obtained verbal consent from Patient.  Visit completed with patient  on the phone  Background:   Past Medical History:  Diagnosis Date   Allergy    Asthma    COPD (chronic obstructive pulmonary disease) (HCC)    Depression    Gynecomastia, male 07/21/2022   Hypertension     Assessment: Patient Reported Symptoms:  Cognitive Cognitive Status: Alert and oriented to person, place, and time, Insightful and able to interpret abstract concepts, Normal speech and language skills Cognitive/Intellectual Conditions Management [RPT]: None reported or documented in medical history or problem list   Health Maintenance Behaviors: Annual physical exam, Immunizations Healing Pattern: Average  Neurological Neurological Review of Symptoms: Vision changes Neurological Conditions:  (none per patient) Neurological Self-Management Outcome: 3 (uncertain) Neurological Comment: Patient report blurred vision in right eye. Patient states he is wearing an old eyeglass prescription and has not seen an eye doctor in 3 years.  HEENT HEENT Symptoms Reported: No symptoms reported, Other: Other HEENT Symptoms/Conditions: Patient reports his teeth are falling out.  He states he thinks it's due to the medication he is taking but not very sure.  He reports a tooth falling out on yesterday and states he recently lost 2 more.  Patient requested assistance with finding a dentist that takes his Medicaid insurance. HEENT Conditions:  (seasonal allergies.) HEENT Management Strategies: Routine screening, Medication therapy HEENT Self-Management Outcome: 4 (good) HEENT Comment: patient reports taking claritin  for management of his seasonal allergies.  (seasonal allergies.)  Cardiovascular Cardiovascular Symptoms Reported: No symptoms  reported Does patient have uncontrolled Hypertension?: No Cardiovascular Conditions: Hypertension Cardiovascular Management Strategies: Routine screening, Medication therapy Weight: 209 lb (94.8 kg) Cardiovascular Self-Management Outcome: 4 (good) Cardiovascular Comment: Patient states he is not able to check his blood pressure due to not having blood pressure monitor.  Respiratory Respiratory Symptoms Reported: No symptoms reported Respiratory Conditions: COPD, Asthma Respiratory Self-Management Outcome: 4 (good) Respiratory Comment: Patient reports having follow up visit with pulmonologist today 05/25/23 and receiving prescription for 2 additional inhalers.  Endocrine Patient reports the following symptoms related to hypoglycemia or hyperglycemia : No symptoms reported Is patient diabetic?: No    Gastrointestinal Gastrointestinal Symptoms Reported: No symptoms reported Gastrointestinal Conditions: Reflux/heartburn Gastrointestinal Management Strategies: Medication therapy (routine follow up) Gastrointestinal Self-Management Outcome: 4 (good) Gastrointestinal Comment: patient states he avoids acidic foods Nutrition Risk Screen (CP): No indicators present  Genitourinary Genitourinary Symptoms Reported: No symptoms reported    Integumentary Integumentary Symptoms Reported: No symptoms reported    Musculoskeletal Musculoskelatal Symptoms Reviewed: No symptoms reported Musculoskeletal Conditions: Back pain (sciatic pain) Musculoskeletal Management Strategies: Routine screening, Medication therapy Musculoskeletal Self-Management Outcome: 4 (good)      Psychosocial Psychosocial Symptoms Reported: No symptoms reported Behavioral Health Conditions: Depression Behavioral Management Strategies: Counseling, Medication therapy Behavioral Health Self-Management Outcome: 4 (good) Major Change/Loss/Stressor/Fears (CP):  (patient states he would rather not address question. States he is managing  this with his therapist and primary care provider.) Techniques to Cope with Loss/Stress/Change: Counseling, Diversional activities Quality of Family Relationships: supportive Do you feel physically threatened by others?: No      05/25/2023   10:50 AM  Depression screen PHQ 2/9  Decreased Interest 0  Down, Depressed, Hopeless 0  PHQ - 2 Score 0    There were no vitals filed for  this visit.  Medications Reviewed Today     Reviewed by Naina Sleeper E, RN (Registered Nurse) on 05/25/23 at 1032  Med List Status: <None>   Medication Order Taking? Sig Documenting Provider Last Dose Status Informant  albuterol  (VENTOLIN  HFA) 108 (90 Base) MCG/ACT inhaler [id number] Yes Inhale 2 puffs into the lungs every 6 (six) hours as needed for wheezing or shortness of breath. Carlean Charter, DO Taking Active   amLODipine  (NORVASC ) 10 MG tablet [id number] Yes Take 1 tablet (10 mg total) by mouth daily for 30 doses. Carlean Charter, DO Taking Active   benzonatate  (TESSALON ) 100 MG capsule [id number] Yes Take 1 capsule (100 mg total) by mouth 2 (two) times daily as needed for cough. Carlean Charter, DO Taking Active   celecoxib  (CELEBREX ) 100 MG capsule [id number] Yes Take 1 capsule (100 mg total) by mouth 2 (two) times daily. Carlean Charter, DO Taking Active   chlorthalidone  (HYGROTON ) 25 MG tablet [id number] Yes Take 1 tablet (25 mg total) by mouth daily. Carlean Charter, DO Taking Active   fluticasone -salmeterol (ADVAIR  HFA) 230-21 MCG/ACT inhaler [id number] No Inhale 2 puffs into the lungs 2 (two) times daily.  Patient not taking: Reported on 05/25/2023   Annitta Kindler, MD Not Taking Active   gabapentin  (NEURONTIN ) 300 MG capsule [id number] Yes Take 1 capsule (300 mg total) by mouth in the morning AND 1 capsule (300 mg total) daily AND 2 capsules (600 mg total) at bedtime. Carlean Charter, DO Taking Active   ipratropium-albuterol  (DUONEB) 0.5-2.5 (3) MG/3ML SOLN [id number] Yes Take 3 mLs by nebulization  every 6 (six) hours as needed. Annitta Kindler, MD Taking Active   loratadine  (CLARITIN ) 10 MG tablet [id number] Yes Take 1 tablet (10 mg total) by mouth daily. Carlean Charter, DO Taking Active            Med Note Marrie Sizer, Cecylia Brazill E   Wed May 25, 2023 10:31 AM) Patient states he takes as needed.  methocarbamol  (ROBAXIN ) 500 MG tablet [id number] Yes Take 1 tablet (500 mg total) by mouth every 6 (six) hours as needed for muscle spasms. Carlean Charter, DO Taking Active   montelukast  (SINGULAIR ) 10 MG tablet [id number] Yes Take 1 tablet (10 mg total) by mouth at bedtime. Carlean Charter, DO Taking Active   naloxone [facility]) nasal spray 4 mg/0.1 mL [id number] Yes Place into the nose. [provider] Taking Active   omeprazole  (PRILOSEC) 20 MG capsule [id number] Yes Take 1 capsule (20 mg total) by mouth daily. Carlean Charter, DO Taking Active            Med Note Marrie Sizer, Bing Duffey E   Wed May 25, 2023 10:32 AM) Patient states he takes as needed.  rosuvastatin  (CRESTOR ) 10 MG tablet [id number] Yes Take 1 tablet (10 mg total) by mouth daily. Carlean Charter, DO Taking Active   sertraline  (ZOLOFT ) 50 MG tablet [id number] Yes Take 1 tablet (50 mg total) by mouth daily. Carlean Charter, DO Taking Active   Spacer/Aero-Holding Chambers (AEROCHAMBER MINI [place]) DEVI [id number]  Use as directed Assaker, Marianne Shirts, MD  Active   umeclidinium bromide  (INCRUSE ELLIPTA ) 62.5 MCG/ACT AEPB [id number] No Inhale 1 puff into the lungs daily.  Patient not taking: Reported on 05/25/2023   Annitta Kindler, MD Not Taking Active   valsartan  (DIOVAN ) 80 MG tablet [id number] Yes Take 1 tablet (80 mg total) by mouth daily. Carlean Charter, DO Taking Active  Recommendation:   PCP Follow-up  Follow Up Plan:   Telephone follow-up in 1 month with RN case manager  Verba Girt RN, BSN, CCM [place]  [facility], Population Health Case Manager Phone: [phone]

## 2023-05-25 NOTE — Patient Instructions (Addendum)
 Visit Information  Larry Maxwell was given information about Medicaid Managed Care team care coordination services as a part of their [employer] Medicaid benefit. Lizzie Riis verbally consented to engagement with the [facility] Managed Care team.   If you are experiencing a medical emergency, please call 911 or report to your local emergency department or urgent care.   If you have a non-emergency medical problem during routine business hours, please contact your provider's office and ask to speak with a nurse.   For questions related to your [employer] Medicaid health plan, please call: [phone]  If you would like to schedule transportation through your [employer] Medicaid plan, please call the following number at least 2 days in advance of your appointment: [phone].   There is no limit to the number of trips during the year between medical appointments, healthcare facilities, or pharmacies. Transportation must be scheduled at least 2 business days before but not more than thirty 30 days before of your appointment.  Call the Behavioral Health Crisis Line at [phone], at any time, 24 hours a day, 7 days a week. If you are in danger or need immediate medical attention call 911.  If you would like help to quit smoking, call 1-800-QUIT-NOW (323-050-5196) OR Espaol: 1-855-Djelo-Ya (7-253-664-4034) o para ms informacin haga clic aqu or Text READY to 742-595 to register via text  Mr. Boerboom - following are the goals we discussed in your visit today:   Please see education materials related to COPD/ HTN provided by MyChart link.  Patient verbalizes understanding of instructions and care plan provided today and agrees to view in MyChart. Active MyChart status and patient understanding of how to access instructions and care plan via MyChart confirmed with patient.     Telephone follow up appointment with Managed Medicaid care management team member scheduled for:  Christalyn Goertz RN, BSN,  CCM [place]  [facility], Population Health Case Manager Phone: [phone]   Following is a copy of your plan of care:  There are no care plans that you recently modified to display for this patient.   Goals Addressed             This Visit's Progress    VBCI RN Care Plan- COPD       Problems:  Chronic Disease Management support and education needs related to COPD  Goal: Over the next 3 months the Patient will attend all scheduled medical appointments: with provider as evidenced by patient report / chart review.         continue to work with Medical illustrator and/or Social Worker to address care management and care coordination needs related to COPD as evidenced by adherence to care management team scheduled appointments     demonstrate Ongoing adherence to prescribed treatment plan for COPD as evidenced by patient report / chart review.  take all medications exactly as prescribed and will call provider for medication related questions as evidenced by patient report / chart review.      Interventions:   COPD Interventions: Advised patient to self assesses COPD action plan zone and make appointment with provider if in the yellow zone for 48 hours without improvement Assessed social determinant of health barriers Advised to take medications as prescribed.  Contact phone number for his [facility] insurance provided. Advised to contact provider to inquire about coverage for BP monitor and request list of Medicaid participating eye doctors.  Advised to scheduled eye doctor appointment  Reviewed COPD action plan. Advised to  notify provider for increase in COPD symptoms or call 911 for severe breathing symptoms. Confirmed patient will be able to pick up new prescriptions provided by pulmonologist.  Provided patient with contact name and phone numbers for dental providers.   Patient Self-Care Activities:  Attend all scheduled provider appointments Call pharmacy  for medication refills 3-7 days in advance of running out of medications Call provider office for new concerns or questions  Take medications as prescribed   listen for public air quality announcements every day eliminate symptom triggers at home follow rescue plan if symptoms flare-up keep follow-up appointments: 06/23/23 at 10 am Contact Medicaid insurance to inquire if there is coverage for BP monitor and to request participating eye doctor list.  Pick up new prescriptions given to you by your pulmonologist.  Call and schedule appointment with dental providers  Plan:  Telephone follow up appointment with care management team member scheduled for:  06/23/23 at 10 am          VBCI RN Care Plan- HTN       Problems:  Chronic Disease Management support and education needs related to COPD  Goal: Over the next 3 months the Patient will attend all scheduled medical appointments: with providers as evidenced by patient report/ chart review.         continue to work with Medical illustrator and/or Social Worker to address care management and care coordination needs related to HTN as evidenced by adherence to care management team scheduled appointments     demonstrate Ongoing adherence to prescribed treatment plan for HTN as evidenced by patient report/ chart review.  take all medications exactly as prescribed and will call provider for medication related questions as evidenced by patient report/ chart review.      Interventions:    Hypertension Interventions: Discussed plans with patient for ongoing care management follow up and provided patient with direct contact information for care management team Reviewed scheduled/upcoming provider appointments including:  Assessed social determinant of health barriers Advised to contact Medicaid provider to see if BP monitor would be coveraged Advised that once blood pressure monitor obtained check blood pressure daily and record Advised to take  medications as prescribed.  Advised to follow a low salt diet.  Advised to keep follow up visits with providers. Last practice recorded BP readings:   BP Readings from Last 3 Encounters:  05/25/23 124/84  04/13/23 (!) 135/93  02/16/23 118/78   Most recent eGFR/CrCl:  Lab Results  Component Value Date   EGFR 51 (L) 07/23/2022    No components found for: "CRCL"  Patient Self-Care Activities:  Attend all scheduled provider appointments Call pharmacy for medication refills 3-7 days in advance of running out of medications Call provider office for new concerns or questions  Take medications as prescribed   keep all doctor appointments take medications for blood pressure exactly as prescribed   Plan:  Telephone follow up appointment with care management team member scheduled for:  06/23/23 at 10 am

## 2023-05-25 NOTE — Progress Notes (Signed)
 Synopsis: Referred in by Carlean Charter, DO   Subjective:   PATIENT ID: Larry Maxwell GENDER: male DOB: 08-14-1965, MRN: [id number]  Chief Complaint  Patient presents with   Follow-up    Wheezing and shortness of breath at night. Reports using Airsupra  and Ventolin , reports moderate symptom control.     HPI Larry Maxwell is a 58 years old male patient with a past medical history of essential hypertension, HLD, MDD and sciatica presenting today to the pulmonary clinic for a follow up visit regarding his mixed restrictive obstructive lung disease.   He presented on 08/07 with complaints of shortness of breath for about a year now. Worse on exertion and uphill. Also worse when laying flat. It is associated with a productive cough of clear sputum for also about year. He does report some chest pain when he coughs a lot. He denies any chills or nightsweats. Denies any dysphagia or acid reflux. Also denies any stiff joints or rashes but does have chronic back pain with sciatica. He had PFTs done by his primary care provider that revealed mixed obstructive and restrictive lung disease with reduced DLCO. He has a cxr from 2006 on file that shows reticular opacities at the bases bilaterally.   In the interim he underwent a High Res CT chest on 09/28/2022 that showed Mild Centrilobular and paraseptal emphysema. Negative for ILD. ANA mildly positive but profile negative. Sjogren antibodies negative, scleroderma antibodies negative. Myomarker panel negative. Anti-CCP and rheumatoid factor negative.   He is doing overall well specially after starting pulmonary rehab. He is currently on Breztri .     05/25/2023 - Doing overall better than when I first saw him. He is currently on breztri  but his insurance is not covering it. His main complaint is fatigue, witnessed apnea and loud snoring.   FH: His Father passed away of lung cancer. His mother had breastcancer.   SH: Exsmoker quit 1 year ago smoked  about 1 PPD for 42 years. He denies alcohol use. Denies any illicit drug use but smokes marijuana occasionally. Currently unemployed, worked in a wearhouse for about 15 years. He has 1 cat at home. No birds or dogs. No travel outside the [place] .   ROS All systems were reviewed and are negative except for the above.  Objective:   Vitals:   10/14/22 0839  Weight: 187 lb 3.2 oz (84.9 kg)  Height: 6\' 1"  (1.854 m)     on RA BMI Readings from Last 3 Encounters:  10/14/22 24.70 kg/m  09/01/22 24.70 kg/m  08/25/22 24.67 kg/m   Wt Readings from Last 3 Encounters:  10/14/22 187 lb 3.2 oz (84.9 kg)  09/01/22 187 lb 3.2 oz (84.9 kg)  08/25/22 187 lb (84.8 kg)    Physical Exam GEN: NAD HEENT: Supple Neck, Reactive Pupils, EOMI  CVS: Normal S1, Normal S2, RRR, No murmurs or ES appreciated  Lungs: Significantly improved air entry Abdomen: Soft, non tender, non distended, + BS  Extremities: Warm and well perfused, No edema  Skin: No suspicious lesions appreciated  Psych: Normal Affect  Ancillary Information   CBC    Component Value Date/Time   WBC 7.4 09/01/2022 0950   RBC 4.90 09/01/2022 0950   HGB 16.5 09/01/2022 0950   HCT 46.3 09/01/2022 0950   PLT 206 09/01/2022 0950   MCV 94.5 09/01/2022 0950   MCH 33.7 09/01/2022 0950   MCHC 35.6 09/01/2022 0950   RDW 11.8 09/01/2022 0950   LYMPHSABS 2.3 09/01/2022 0950  MONOABS 0.5 09/01/2022 0950   EOSABS 0.2 09/01/2022 0950   BASOSABS 0.0 09/01/2022 0950    Imaging  CXR 2006: Chronic lung changes - no acute process.   High Res CT chest 09/03  Mild centrilobular and paraseptal emphysema. Negative for subpleural reticulation, traction bronchiectasis/bronchiolectasis, ground glass, architectural distortion or honeycombing. 4 mm nodular density in the peripheral left lower lobe (7/92). Probable subpleural lymph nodes along the left major fissure. No pleural fluid. Airway is unremarkable. Minimal air trapping.  PFTs 08/24/2022: FVC  2.3 L 43% of predicted FEV1 1.19 L 28% of predicted FEV1 to FVC ratio 51 with lower limit of normal 67.  Total lung capacity was reduced at 4.59 L 60% of predicted.  DLCO was reduced at 50% of predicted.     Latest Ref Rng & Units 08/24/2022    3:28 PM  PFT Results  FVC-Pre L 2.34   FVC-Predicted Pre % 43   FVC-Post L 2.90   FVC-Predicted Post % 53   Pre FEV1/FVC % % 51   Post FEV1/FCV % % 61   FEV1-Pre L 1.19   FEV1-Predicted Pre % 28   FEV1-Post L 1.78   DLCO uncorrected ml/min/mmHg 15.57   DLCO UNC% % 50   DLVA Predicted % 90   TLC L 4.59   TLC % Predicted % 60   RV % Predicted % 97      Assessment & Plan:  Larry Maxwell is a 58 years old male patient with a past medical history of essential hypertension, HLD, MDD, tobacco use in the past and sciatica presenting today to the pulmonary clinic after a pulmonary function test showing restrictive lung disease.   #COPD Stage 4 gp B w/ asthma overalp CAT 35 09/2022  CAT 23 05/25/2023  High res CT chest was under whelming for any signs of ILD.   ANA mildly positive but profile negative. Sjogren antibodies negative, scleroderma antibodies negative.  []  Advair 230 + Incruse Ellipta []  C/w Albuterol  as needed.  []  Completed 8 week Pulmonary Rehab program.   #Tobacco use disorder Quit a year ago. Counseled on importance of continuing with abstinence from smoking. []  Will Enrolling in low dose CT chest for lung cancer screening during next visit.   #Suspicion for OSA  With witnessed apnea, loud snoring and daily fatigue with multiple naps.   []  Split night.   RTC 3 months.   I spent 30 minutes caring for this patient today, including preparing to see the patient, obtaining a medical history , reviewing a separately obtained history, performing a medically appropriate examination and/or evaluation, counseling and educating the patient/family/caregiver, ordering medications, tests, or procedures, documenting clinical information  in the electronic health record, and independently interpreting results (not separately reported/billed) and communicating results to the patient/family/caregiver  Annitta Kindler, MD [facility] 10/14/2022 8:40 AM

## 2023-05-30 ENCOUNTER — Other Ambulatory Visit: Payer: Self-pay

## 2023-05-30 ENCOUNTER — Telehealth: Payer: Self-pay | Admitting: Family Medicine

## 2023-05-30 ENCOUNTER — Other Ambulatory Visit: Payer: Self-pay | Admitting: Family Medicine

## 2023-05-30 DIAGNOSIS — I10 Essential (primary) hypertension: Secondary | ICD-10-CM

## 2023-05-30 MED ORDER — BLOOD PRESSURE MONITOR/ARM DEVI: 1.0000 | Freq: Once | 0 refills | Status: DC

## 2023-05-30 MED ORDER — BLOOD PRESSURE MONITOR/ARM DEVI
1.0000 | Freq: Once | 0 refills | Status: AC
  Filled 2023-05-30: qty 1, fill #0

## 2023-05-30 NOTE — Telephone Encounter (Signed)
 Patient would like all prescriptions including BP cuff device to go to [facility] pharmacy.

## 2023-05-30 NOTE — Addendum Note (Signed)
 Addended by: Edna Grover E on: 05/30/2023 02:04 PM   Modules accepted: Orders

## 2023-05-30 NOTE — Telephone Encounter (Signed)
 LVM- to pick up blood pressure prescription-left up front on [address] side

## 2023-05-30 NOTE — Telephone Encounter (Unsigned)
 Copied from CRM [id number]. Topic: General - Other >> May 30, 2023  8:59 AM Larry Maxwell wrote: Reason for CRM: Patient is returning a call from office. Advised patient is note regarding prescription for blood pressure medication being available for pick up. No answer when calling [facility]. Patient wants the prescription to be sent to [facility] pharmacy. Patient also wants a prescription for a blood pressure cuff. Callback number is [phone]

## 2023-05-31 ENCOUNTER — Other Ambulatory Visit: Payer: Self-pay | Admitting: Family Medicine

## 2023-05-31 ENCOUNTER — Other Ambulatory Visit: Payer: Self-pay

## 2023-05-31 DIAGNOSIS — G8929 Other chronic pain: Secondary | ICD-10-CM

## 2023-06-01 ENCOUNTER — Telehealth: Payer: Self-pay

## 2023-06-01 ENCOUNTER — Other Ambulatory Visit: Payer: Self-pay

## 2023-06-01 NOTE — Telephone Encounter (Signed)
 Copied from CRM [id number]. Topic: General - Call Back - No Documentation >> Jun 01, 2023 11:03 AM Oddis Bench wrote: Reason for CRM: Patient is returning call did not see any notes for call on today did see rx visit.

## 2023-06-02 ENCOUNTER — Other Ambulatory Visit: Payer: Self-pay

## 2023-06-02 MED FILL — Methocarbamol Tab 500 MG: ORAL | 5 days supply | Qty: 20 | Fill #0 | Status: CN

## 2023-06-13 ENCOUNTER — Other Ambulatory Visit: Payer: Self-pay

## 2023-06-15 ENCOUNTER — Ambulatory Visit: Payer: MEDICAID | Admitting: Clinical

## 2023-06-23 ENCOUNTER — Other Ambulatory Visit: Payer: MEDICAID

## 2023-06-23 NOTE — Patient Outreach (Signed)
 Complex Care Management   Visit Note  06/23/2023  Name:  Larry Maxwell MRN: [id number] DOB: 01-18-1966  Situation: Successful contact made with patient.   Patient states he is not feeling very good this morning and does not want to complete call.  Request return call back on another day.   Background:   Past Medical History:  Diagnosis Date   Allergy    Asthma    COPD (chronic obstructive pulmonary disease) (HCC)    Depression    Gynecomastia, male 07/21/2022   Hypertension     Follow Up Plan:   Telephone follow-up in 1 week  Verba Girt RN, BSN, CCM [place]  [facility], Population Health Case Manager Phone: [phone]

## 2023-06-29 ENCOUNTER — Ambulatory Visit: Payer: MEDICAID | Admitting: Family Medicine

## 2023-06-29 ENCOUNTER — Other Ambulatory Visit: Payer: Self-pay

## 2023-06-29 NOTE — Patient Outreach (Signed)
 Complex Care Management   Visit Note  06/29/2023  Name:  Larry Maxwell MRN: [id number] DOB: 1965/07/06  Situation: Referral received for Complex Care Management related to COPD and HTN I obtained verbal consent from Patient.  Visit completed with patient  on the phone  Background:   Past Medical History:  Diagnosis Date   Allergy    Asthma    COPD (chronic obstructive pulmonary disease) (HCC)    Depression    Gynecomastia, male 07/21/2022   Hypertension     Assessment: Patient Reported Symptoms:  Cognitive Cognitive Status: Alert and oriented to person, place, and time, Insightful and able to interpret abstract concepts, Normal speech and language skills      Neurological Neurological Review of Symptoms: Vision changes Neurological Comment: reports ongoing blurred vision in right eye.  States he is wearing an old eyeglass prescription.  HEENT HEENT Symptoms Reported: Mouth or teeth pain Other HEENT Symptoms/Conditions: patient reports ongoing dental concerns due to teeth falling out. Reports recent lost of 2-3 teeth. HEENT Conditions: Tooth problem(s) Tooth Problems: missing Tooth problem(s)  Cardiovascular Cardiovascular Symptoms Reported: No symptoms reported Does patient have uncontrolled Hypertension?: No Cardiovascular Conditions: Hypertension Cardiovascular Management Strategies: Routine screening, Medication therapy, Medical device, Diet modification Cardiovascular Self-Management Outcome: 4 (good) Cardiovascular Comment: Patient states he has obtained a blood pressure monitor and is checking his blood pressure daily.  He reports his blood pressures are ranging in the 120's/80's.  Respiratory Respiratory Symptoms Reported: No symptoms reported Respiratory Conditions: COPD, Asthma Respiratory Comment: Patient states he is taking his inhalers as prescribed.  He states the inhalers have greatly improved his breathing.  Endocrine Patient reports the following symptoms  related to hypoglycemia or hyperglycemia : No symptoms reported Is patient diabetic?: No    Gastrointestinal Gastrointestinal Symptoms Reported: No symptoms reported      Genitourinary Genitourinary Symptoms Reported: No symptoms reported    Integumentary Integumentary Symptoms Reported: No symptoms reported    Musculoskeletal Musculoskelatal Symptoms Reviewed: No symptoms reported        Psychosocial Psychosocial Symptoms Reported: No symptoms reported            05/25/2023   10:50 AM  Depression screen PHQ 2/9  Decreased Interest 0  Down, Depressed, Hopeless 0  PHQ - 2 Score 0    Vitals:   06/29/23 2218  BP: 120/80    Medications Reviewed Today     Reviewed by Wheeler Incorvaia E, RN (Registered Nurse) on 06/29/23 at [phone]  Med List Status: <None>   Medication Order Taking? Sig Documenting Provider Last Dose Status Informant  albuterol  (VENTOLIN  HFA) 108 (90 Base) MCG/ACT inhaler [id number] Yes Inhale 2 puffs into the lungs every 6 (six) hours as needed for wheezing or shortness of breath. Carlean Charter, DO Taking Active   amLODipine  (NORVASC ) 10 MG tablet [id number]  Take 1 tablet (10 mg total) by mouth daily for 30 doses. Carlean Charter, DO  Expired 06/05/23 2359   benzonatate  (TESSALON ) 100 MG capsule [id number] Yes Take 1 capsule (100 mg total) by mouth 2 (two) times daily as needed for cough. Carlean Charter, DO Taking Active   celecoxib  (CELEBREX ) 100 MG capsule [id number] Yes Take 1 capsule (100 mg total) by mouth 2 (two) times daily. Carlean Charter, DO Taking Active   chlorthalidone  (HYGROTON ) 25 MG tablet [id number] Yes Take 1 tablet (25 mg total) by mouth daily. Carlean Charter, DO Taking Active   fluticasone -salmeterol (ADVAIR  HFA) 230-21 MCG/ACT inhaler [id number] Yes Inhale  2 puffs into the lungs 2 (two) times daily. Annitta Kindler, MD Taking Active   gabapentin  (NEURONTIN ) 300 MG capsule [id number] Yes Take 1 capsule (300 mg total) by mouth in the morning AND  1 capsule (300 mg total) daily AND 2 capsules (600 mg total) at bedtime. Carlean Charter, DO Taking Active   ipratropium-albuterol  (DUONEB) 0.5-2.5 (3) MG/3ML SOLN [id number] Yes Take 3 mLs by nebulization every 6 (six) hours as needed. Annitta Kindler, MD Taking Active   loratadine  (CLARITIN ) 10 MG tablet [id number] Yes Take 1 tablet (10 mg total) by mouth daily. Carlean Charter, DO Taking Active            Med Note Marrie Sizer, Dany Walther E   Wed May 25, 2023 10:31 AM) Patient states he takes as needed.  methocarbamol  (ROBAXIN ) 500 MG tablet [id number] Yes Take 1 tablet (500 mg total) by mouth every 6 (six) hours as needed for muscle spasms. Carlean Charter, DO Taking Active   montelukast  (SINGULAIR ) 10 MG tablet [id number] Yes Take 1 tablet (10 mg total) by mouth at bedtime. Carlean Charter, DO Taking Active   naloxone [facility]) nasal spray 4 mg/0.1 mL [id number] Yes Place into the nose. [provider] Taking Active   omeprazole  (PRILOSEC) 20 MG capsule [id number] Yes Take 1 capsule (20 mg total) by mouth daily. Carlean Charter, DO Taking Active            Med Note Marrie Sizer, Bayard More E   Wed May 25, 2023 10:32 AM) Patient states he takes as needed.  rosuvastatin  (CRESTOR ) 10 MG tablet [id number] Yes Take 1 tablet (10 mg total) by mouth daily. Carlean Charter, DO Taking Active   sertraline  (ZOLOFT ) 50 MG tablet [id number] Yes Take 1 tablet (50 mg total) by mouth daily. Carlean Charter, DO Taking Active   Spacer/Aero-Holding Chambers (AEROCHAMBER MINI [place]) DEVI [id number]  Use as directed Assaker, Marianne Shirts, MD  Active   umeclidinium bromide  (INCRUSE ELLIPTA ) 62.5 MCG/ACT AEPB [id number] Yes Inhale 1 puff into the lungs daily. Annitta Kindler, MD Taking Active   valsartan  (DIOVAN ) 80 MG tablet [id number] Yes Take 1 tablet (80 mg total) by mouth daily. Carlean Charter, DO Taking Active             Recommendation:   PCP Follow-up  Follow Up Plan:   Patient to transition to the [facility]  tailored plan case manager.  Verba Girt RN, BSN, CCM [employer], Population Health Case Manager Phone: [phone]

## 2023-06-29 NOTE — Patient Instructions (Signed)
 Visit Information  Thank you for taking time to visit with me today.   Tailored Plan Medicaid On July 26, 2022 some people on [place] Medicaid will move to a new kind of Medicaid health plan called a Tailored Plan. Tailored Plans cover your doctor visits, prescription drugs, and health care services.    If your [place] Medicaid will move to a Tailored Plan, you should have gotten a letter and welcome packet. If you're not sure, call your [place] Medicaid Enrollment Broker at [phone] and ask.  Check out these free materials, in [place] and [place], to learn more about your Tailored Plan: [email or web address]/Tailored-Plans/Toolkit  Tailored Care Management Services  TCM services are available to you now. If you are a Tailored Plan member or will be and want information about Tailored Care Management Services including rides to appointments and community and home services, call the Care Management provider for your county of residence:    [facility] [place], [place])  Member Services: [phone] Behavioral Health Crisis Line: [phone], [place], [place], [place], [place])  Member Services: [phone] Behavioral Health Crisis Line: [phone]     Please call the Suicide and Crisis Lifeline: 988 call 1-800-273-TALK (toll free, 24 hour hotline) if you are experiencing a Mental Health or Behavioral Health Crisis or need someone to talk to.  Patient verbalizes understanding of instructions and care plan provided today and agrees to view in MyChart. Active MyChart status and patient understanding of how to access instructions and care plan via MyChart confirmed with patient.     Verba Girt RN, BSN, CCM [employer], Population Health Case Manager Phone: [phone]

## 2023-06-30 ENCOUNTER — Encounter: Payer: MEDICAID | Admitting: Clinical

## 2023-06-30 NOTE — Progress Notes (Signed)
 This encounter was created in error - please disregard.

## 2023-06-30 NOTE — Progress Notes (Signed)
   Larry Barthel, LCSW

## 2023-07-07 ENCOUNTER — Other Ambulatory Visit: Payer: Self-pay

## 2023-07-07 ENCOUNTER — Other Ambulatory Visit (HOSPITAL_COMMUNITY): Payer: Self-pay

## 2023-07-07 ENCOUNTER — Other Ambulatory Visit: Payer: Self-pay | Admitting: Family Medicine

## 2023-07-07 DIAGNOSIS — R202 Paresthesia of skin: Secondary | ICD-10-CM

## 2023-07-07 DIAGNOSIS — F321 Major depressive disorder, single episode, moderate: Secondary | ICD-10-CM

## 2023-07-07 DIAGNOSIS — J449 Chronic obstructive pulmonary disease, unspecified: Secondary | ICD-10-CM

## 2023-07-07 DIAGNOSIS — G8929 Other chronic pain: Secondary | ICD-10-CM

## 2023-07-07 MED ORDER — CHLORTHALIDONE 25 MG PO TABS
25.0000 mg | ORAL_TABLET | Freq: Every day | ORAL | 0 refills | Status: DC
  Filled 2023-07-07: qty 90, 90d supply, fill #0

## 2023-07-07 MED ORDER — MONTELUKAST SODIUM 10 MG PO TABS
10.0000 mg | ORAL_TABLET | Freq: Every day | ORAL | 0 refills | Status: DC
  Filled 2023-07-07: qty 90, 90d supply, fill #0

## 2023-07-07 MED ORDER — SERTRALINE HCL 50 MG PO TABS
50.0000 mg | ORAL_TABLET | Freq: Every day | ORAL | 3 refills | Status: DC
  Filled 2023-07-07: qty 30, 30d supply, fill #0
  Filled 2023-08-05: qty 30, 30d supply, fill #1

## 2023-07-07 MED FILL — Methocarbamol Tab 500 MG: ORAL | 5 days supply | Qty: 20 | Fill #0 | Status: AC

## 2023-07-08 ENCOUNTER — Other Ambulatory Visit: Payer: Self-pay

## 2023-07-08 ENCOUNTER — Other Ambulatory Visit: Payer: Self-pay | Admitting: Family Medicine

## 2023-07-08 DIAGNOSIS — R202 Paresthesia of skin: Secondary | ICD-10-CM

## 2023-07-08 DIAGNOSIS — G8929 Other chronic pain: Secondary | ICD-10-CM

## 2023-07-08 MED FILL — Methocarbamol Tab 500 MG: ORAL | 5 days supply | Qty: 20 | Fill #1 | Status: CN

## 2023-07-11 ENCOUNTER — Other Ambulatory Visit (HOSPITAL_COMMUNITY): Payer: Self-pay

## 2023-07-11 MED ORDER — GABAPENTIN 300 MG PO CAPS
ORAL_CAPSULE | ORAL | 0 refills | Status: DC
  Filled 2023-07-11 – 2023-10-02 (×3): qty 360, 90d supply, fill #0

## 2023-07-11 MED ORDER — CELECOXIB 100 MG PO CAPS
100.0000 mg | ORAL_CAPSULE | Freq: Two times a day (BID) | ORAL | 0 refills | Status: DC
  Filled 2023-07-11 – 2023-10-02 (×3): qty 180, 90d supply, fill #0

## 2023-07-14 ENCOUNTER — Ambulatory Visit: Payer: MEDICAID | Admitting: Family Medicine

## 2023-07-14 ENCOUNTER — Other Ambulatory Visit (HOSPITAL_COMMUNITY): Payer: Self-pay

## 2023-07-21 ENCOUNTER — Other Ambulatory Visit (HOSPITAL_COMMUNITY): Payer: Self-pay

## 2023-07-28 ENCOUNTER — Telehealth: Payer: MEDICAID | Admitting: *Deleted

## 2023-07-28 ENCOUNTER — Encounter: Payer: Self-pay | Admitting: *Deleted

## 2023-08-05 ENCOUNTER — Other Ambulatory Visit: Payer: Self-pay | Admitting: Family Medicine

## 2023-08-05 ENCOUNTER — Other Ambulatory Visit (HOSPITAL_COMMUNITY): Payer: Self-pay

## 2023-08-05 ENCOUNTER — Other Ambulatory Visit: Payer: Self-pay

## 2023-08-05 ENCOUNTER — Other Ambulatory Visit (HOSPITAL_BASED_OUTPATIENT_CLINIC_OR_DEPARTMENT_OTHER): Payer: Self-pay

## 2023-08-05 DIAGNOSIS — R1013 Epigastric pain: Secondary | ICD-10-CM

## 2023-08-05 MED ORDER — OMEPRAZOLE 20 MG PO CPDR
20.0000 mg | DELAYED_RELEASE_CAPSULE | Freq: Every day | ORAL | 3 refills | Status: AC
  Filled 2023-08-05 – 2023-10-02 (×2): qty 30, 30d supply, fill #0
  Filled 2023-12-10: qty 30, 30d supply, fill #1
  Filled 2024-02-03: qty 30, 30d supply, fill #2

## 2023-08-05 MED FILL — Methocarbamol Tab 500 MG: ORAL | 5 days supply | Qty: 20 | Fill #1 | Status: CN

## 2023-08-15 ENCOUNTER — Other Ambulatory Visit (HOSPITAL_COMMUNITY): Payer: Self-pay

## 2023-08-17 ENCOUNTER — Encounter: Payer: Self-pay | Admitting: Family Medicine

## 2023-08-17 ENCOUNTER — Other Ambulatory Visit: Payer: Self-pay

## 2023-08-17 ENCOUNTER — Telehealth: Payer: MEDICAID | Admitting: Family Medicine

## 2023-08-17 ENCOUNTER — Ambulatory Visit: Payer: MEDICAID | Admitting: Pulmonary Disease

## 2023-08-17 ENCOUNTER — Encounter: Payer: Self-pay | Admitting: Pulmonary Disease

## 2023-08-17 VITALS — BP 110/64 | HR 83 | Temp 97.1°F | Ht 73.5 in | Wt 199.4 lb

## 2023-08-17 DIAGNOSIS — J4489 Other specified chronic obstructive pulmonary disease: Secondary | ICD-10-CM | POA: Diagnosis not present

## 2023-08-17 DIAGNOSIS — Z87891 Personal history of nicotine dependence: Secondary | ICD-10-CM | POA: Diagnosis not present

## 2023-08-17 DIAGNOSIS — J45909 Unspecified asthma, uncomplicated: Secondary | ICD-10-CM | POA: Diagnosis not present

## 2023-08-17 DIAGNOSIS — I10 Essential (primary) hypertension: Secondary | ICD-10-CM | POA: Diagnosis not present

## 2023-08-17 DIAGNOSIS — R7303 Prediabetes: Secondary | ICD-10-CM

## 2023-08-17 DIAGNOSIS — G8929 Other chronic pain: Secondary | ICD-10-CM

## 2023-08-17 DIAGNOSIS — M5442 Lumbago with sciatica, left side: Secondary | ICD-10-CM | POA: Diagnosis not present

## 2023-08-17 DIAGNOSIS — F3341 Major depressive disorder, recurrent, in partial remission: Secondary | ICD-10-CM

## 2023-08-17 DIAGNOSIS — J449 Chronic obstructive pulmonary disease, unspecified: Secondary | ICD-10-CM

## 2023-08-17 MED ORDER — METHOCARBAMOL 500 MG PO TABS
500.0000 mg | ORAL_TABLET | Freq: Four times a day (QID) | ORAL | 1 refills | Status: DC | PRN
  Filled 2023-08-17 – 2023-10-02 (×2): qty 20, 5d supply, fill #0

## 2023-08-17 MED ORDER — METHYLPREDNISOLONE 4 MG PO TBPK
ORAL_TABLET | ORAL | 0 refills | Status: AC
Start: 2023-08-17 — End: 2023-08-23
  Filled 2023-08-17: qty 21, 6d supply, fill #0

## 2023-08-17 MED ORDER — SERTRALINE HCL 50 MG PO TABS
50.0000 mg | ORAL_TABLET | Freq: Every day | ORAL | 1 refills | Status: DC
  Filled 2023-08-17 – 2023-10-02 (×2): qty 90, 90d supply, fill #0

## 2023-08-17 NOTE — Progress Notes (Signed)
 Synopsis: Referred in by Larry Lauraine SAILOR, DO   Subjective:   PATIENT ID: Larry Maxwell GENDER: male DOB: [birth year], MRN: [id number]  Chief Complaint  Patient presents with   Follow-up    Wheezing and shortness of breath at night. Reports using Airsupra  and Ventolin , reports moderate symptom control.     HPI [name] is a 58 years old male patient with a past medical history of essential hypertension, HLD, MDD and sciatica presenting today to the pulmonary clinic for a follow up visit regarding his mixed restrictive obstructive lung disease.   He presented on 08/07 with complaints of shortness of breath for about a year now. Worse on exertion and uphill. Also worse when laying flat. It is associated with a productive cough of clear sputum for also about year. He does report some chest pain when he coughs a lot. He denies any chills or nightsweats. Denies any dysphagia or acid reflux. Also denies any stiff joints or rashes but does have chronic back pain with sciatica. He had PFTs done by his primary care provider that revealed mixed obstructive and restrictive lung disease with reduced DLCO. He has a cxr from 2006 on file that shows reticular opacities at the bases bilaterally.   In the interim he underwent a High Res CT chest on 09/28/2022 that showed Mild Centrilobular and paraseptal emphysema. Negative for ILD. ANA mildly positive but profile negative. Sjogren antibodies negative, scleroderma antibodies negative. Myomarker panel negative. Anti-CCP and rheumatoid factor negative.   He is doing overall well specially after starting pulmonary rehab. He is currently on Breztri .     05/25/2023 - Doing overall better than when I first saw him. He is currently on breztri  but his insurance is not covering it. His main complaint is fatigue, witnessed apnea and loud snoring.   08/17/2023 - [name] is doing well from a respiratory standpoint. But unfortunately he lost his daughter  recently and is in grieving period. There is no change to his regimen as below and I will see him back in 6 months.   FH: His Father passed away of lung cancer. His mother had breastcancer.   SH: Exsmoker quit 1 year ago smoked about 1 PPD for 42 years. He denies alcohol use. Denies any illicit drug use but smokes marijuana occasionally. Currently unemployed, worked in a wearhouse for about 15 years. He has 1 cat at home. No birds or dogs. No travel outside the [place] .   ROS All systems were reviewed and are negative except for the above.  Objective:   Vitals:   10/14/22 0839  Weight: 187 lb 3.2 oz (84.9 kg)  Height: 6' 1 (1.854 m)     on RA BMI Readings from Last 3 Encounters:  10/14/22 24.70 kg/m  09/01/22 24.70 kg/m  08/25/22 24.67 kg/m   Wt Readings from Last 3 Encounters:  10/14/22 187 lb 3.2 oz (84.9 kg)  09/01/22 187 lb 3.2 oz (84.9 kg)  08/25/22 187 lb (84.8 kg)    Physical Exam GEN: NAD HEENT: Supple Neck, Reactive Pupils, EOMI  CVS: Normal S1, Normal S2, RRR, No murmurs or ES appreciated  Lungs: Significantly improved air entry Abdomen: Soft, non tender, non distended, + BS  Extremities: Warm and well perfused, No edema  Skin: No suspicious lesions appreciated  Psych: Normal Affect  Ancillary Information   CBC    Component Value Date/Time   WBC 7.4 09/01/2022 0950   RBC 4.90 09/01/2022 0950   HGB 16.5 09/01/2022 0950  HCT 46.3 09/01/2022 0950   PLT 206 09/01/2022 0950   MCV 94.5 09/01/2022 0950   MCH 33.7 09/01/2022 0950   MCHC 35.6 09/01/2022 0950   RDW 11.8 09/01/2022 0950   LYMPHSABS 2.3 09/01/2022 0950   MONOABS 0.5 09/01/2022 0950   EOSABS 0.2 09/01/2022 0950   BASOSABS 0.0 09/01/2022 0950    Imaging  CXR 2006: Chronic lung changes - no acute process.   High Res CT chest 09/03  Mild centrilobular and paraseptal emphysema. Negative for subpleural reticulation, traction bronchiectasis/bronchiolectasis, ground glass, architectural distortion  or honeycombing. 4 mm nodular density in the peripheral left lower lobe (7/92). Probable subpleural lymph nodes along the left major fissure. No pleural fluid. Airway is unremarkable. Minimal air trapping.  PFTs 08/24/2022: FVC 2.3 L 43% of predicted FEV1 1.19 L 28% of predicted FEV1 to FVC ratio 51 with lower limit of normal 67.  Total lung capacity was reduced at 4.59 L 60% of predicted.  DLCO was reduced at 50% of predicted.     Latest Ref Rng & Units 08/24/2022    3:28 PM  PFT Results  FVC-Pre L 2.34   FVC-Predicted Pre % 43   FVC-Post L 2.90   FVC-Predicted Post % 53   Pre FEV1/FVC % % 51   Post FEV1/FCV % % 61   FEV1-Pre L 1.19   FEV1-Predicted Pre % 28   FEV1-Post L 1.78   DLCO uncorrected ml/min/mmHg 15.57   DLCO UNC% % 50   DLVA Predicted % 90   TLC L 4.59   TLC % Predicted % 60   RV % Predicted % 97      Assessment & Plan:  [name] is a 58 years old male patient with a past medical history of essential hypertension, HLD, MDD, tobacco use in the past and sciatica presenting today to the pulmonary clinic after a pulmonary function test showing restrictive lung disease.   #COPD Stage III gp B w/ asthma overalp CAT 35 09/2022  CAT 23 05/25/2023  High res CT chest was under whelming for any signs of ILD.   ANA mildly positive but profile negative. Sjogren antibodies negative, scleroderma antibodies negative.  []  Advair  230 + Incruse Ellipta  []  C/w Albuterol  as needed.  []  Completed 8 week Pulmonary Rehab program.   #Tobacco use disorder Quit a year ago. Counseled on importance of continuing with abstinence from smoking. []  Will Enroll in low dose CT chest for lung cancer screening during next visit.   #Suspicion for OSA  With witnessed apnea, loud snoring and daily fatigue with multiple naps.   []  Split night.   RTC 6 months.   I spent 20 minutes caring for this patient today, including preparing to see the patient, obtaining a medical history , reviewing  a separately obtained history, performing a medically appropriate examination and/or evaluation, counseling and educating the patient/family/caregiver, documenting clinical information in the electronic health record, and independently interpreting results (not separately reported/billed) and communicating results to the patient/family/caregiver  Darrin Barn, MD [facility] 08/17/2023 8:53 AM ,

## 2023-08-17 NOTE — Progress Notes (Signed)
 Synopsis: Referred in by Carlean Charter, DO   Subjective:   PATIENT ID: Larry Maxwell GENDER: male DOB: 08-14-1965, MRN: [id number]  Chief Complaint  Patient presents with   Follow-up    Wheezing and shortness of breath at night. Reports using Airsupra  and Ventolin , reports moderate symptom control.     HPI Larry Maxwell is a 58 years old male patient with a past medical history of essential hypertension, HLD, MDD and sciatica presenting today to the pulmonary clinic for a follow up visit regarding his mixed restrictive obstructive lung disease.   He presented on 08/07 with complaints of shortness of breath for about a year now. Worse on exertion and uphill. Also worse when laying flat. It is associated with a productive cough of clear sputum for also about year. He does report some chest pain when he coughs a lot. He denies any chills or nightsweats. Denies any dysphagia or acid reflux. Also denies any stiff joints or rashes but does have chronic back pain with sciatica. He had PFTs done by his primary care provider that revealed mixed obstructive and restrictive lung disease with reduced DLCO. He has a cxr from 2006 on file that shows reticular opacities at the bases bilaterally.   In the interim he underwent a High Res CT chest on 09/28/2022 that showed Mild Centrilobular and paraseptal emphysema. Negative for ILD. ANA mildly positive but profile negative. Sjogren antibodies negative, scleroderma antibodies negative. Myomarker panel negative. Anti-CCP and rheumatoid factor negative.   He is doing overall well specially after starting pulmonary rehab. He is currently on Breztri .     05/25/2023 - Doing overall better than when I first saw him. He is currently on breztri  but his insurance is not covering it. His main complaint is fatigue, witnessed apnea and loud snoring.   FH: His Father passed away of lung cancer. His mother had breastcancer.   SH: Exsmoker quit 1 year ago smoked  about 1 PPD for 42 years. He denies alcohol use. Denies any illicit drug use but smokes marijuana occasionally. Currently unemployed, worked in a wearhouse for about 15 years. He has 1 cat at home. No birds or dogs. No travel outside the [place] .   ROS All systems were reviewed and are negative except for the above.  Objective:   Vitals:   10/14/22 0839  Weight: 187 lb 3.2 oz (84.9 kg)  Height: 6\' 1"  (1.854 m)     on RA BMI Readings from Last 3 Encounters:  10/14/22 24.70 kg/m  09/01/22 24.70 kg/m  08/25/22 24.67 kg/m   Wt Readings from Last 3 Encounters:  10/14/22 187 lb 3.2 oz (84.9 kg)  09/01/22 187 lb 3.2 oz (84.9 kg)  08/25/22 187 lb (84.8 kg)    Physical Exam GEN: NAD HEENT: Supple Neck, Reactive Pupils, EOMI  CVS: Normal S1, Normal S2, RRR, No murmurs or ES appreciated  Lungs: Significantly improved air entry Abdomen: Soft, non tender, non distended, + BS  Extremities: Warm and well perfused, No edema  Skin: No suspicious lesions appreciated  Psych: Normal Affect  Ancillary Information   CBC    Component Value Date/Time   WBC 7.4 09/01/2022 0950   RBC 4.90 09/01/2022 0950   HGB 16.5 09/01/2022 0950   HCT 46.3 09/01/2022 0950   PLT 206 09/01/2022 0950   MCV 94.5 09/01/2022 0950   MCH 33.7 09/01/2022 0950   MCHC 35.6 09/01/2022 0950   RDW 11.8 09/01/2022 0950   LYMPHSABS 2.3 09/01/2022 0950  MONOABS 0.5 09/01/2022 0950   EOSABS 0.2 09/01/2022 0950   BASOSABS 0.0 09/01/2022 0950    Imaging  CXR 2006: Chronic lung changes - no acute process.   High Res CT chest 09/03  Mild centrilobular and paraseptal emphysema. Negative for subpleural reticulation, traction bronchiectasis/bronchiolectasis, ground glass, architectural distortion or honeycombing. 4 mm nodular density in the peripheral left lower lobe (7/92). Probable subpleural lymph nodes along the left major fissure. No pleural fluid. Airway is unremarkable. Minimal air trapping.  PFTs 08/24/2022: FVC  2.3 L 43% of predicted FEV1 1.19 L 28% of predicted FEV1 to FVC ratio 51 with lower limit of normal 67.  Total lung capacity was reduced at 4.59 L 60% of predicted.  DLCO was reduced at 50% of predicted.     Latest Ref Rng & Units 08/24/2022    3:28 PM  PFT Results  FVC-Pre L 2.34   FVC-Predicted Pre % 43   FVC-Post L 2.90   FVC-Predicted Post % 53   Pre FEV1/FVC % % 51   Post FEV1/FCV % % 61   FEV1-Pre L 1.19   FEV1-Predicted Pre % 28   FEV1-Post L 1.78   DLCO uncorrected ml/min/mmHg 15.57   DLCO UNC% % 50   DLVA Predicted % 90   TLC L 4.59   TLC % Predicted % 60   RV % Predicted % 97      Assessment & Plan:  Larry Maxwell is a 58 years old male patient with a past medical history of essential hypertension, HLD, MDD, tobacco use in the past and sciatica presenting today to the pulmonary clinic after a pulmonary function test showing restrictive lung disease.   #COPD Stage 4 gp B w/ asthma overalp CAT 35 09/2022  CAT 23 05/25/2023  High res CT chest was under whelming for any signs of ILD.   ANA mildly positive but profile negative. Sjogren antibodies negative, scleroderma antibodies negative.  []  Advair 230 + Incruse Ellipta []  C/w Albuterol  as needed.  []  Completed 8 week Pulmonary Rehab program.   #Tobacco use disorder Quit a year ago. Counseled on importance of continuing with abstinence from smoking. []  Will Enrolling in low dose CT chest for lung cancer screening during next visit.   #Suspicion for OSA  With witnessed apnea, loud snoring and daily fatigue with multiple naps.   []  Split night.   RTC 3 months.   I spent 30 minutes caring for this patient today, including preparing to see the patient, obtaining a medical history , reviewing a separately obtained history, performing a medically appropriate examination and/or evaluation, counseling and educating the patient/family/caregiver, ordering medications, tests, or procedures, documenting clinical information  in the electronic health record, and independently interpreting results (not separately reported/billed) and communicating results to the patient/family/caregiver  Annitta Kindler, MD [facility] 10/14/2022 8:40 AM

## 2023-08-17 NOTE — Progress Notes (Signed)
 MyChart Video Visit    Virtual Visit via Video Note   This format is felt to be most appropriate for this patient at this time. Physical exam was limited by quality of the video and audio technology used for the visit.   Patient location: Home Provider location: [facility]  I discussed the limitations of evaluation and management by telemedicine and the availability of in person appointments. The patient expressed understanding and agreed to proceed.  Patient: Larry Maxwell   DOB: 04-Aug-1965   58 y.o. Male  MRN: [id number] Visit Date: 08/17/2023  Today's healthcare provider: LAURAINE LOISE BUOY, DO   No chief complaint on file.  Subjective    HPI  Larry Maxwell is a 58 year old male with chronic back pain and sciatica who presents for follow-up.  He has been experiencing chronic back pain for 15 to 20 years, localized in the lower back just above the tailbone, described as severe. He manages the pain with Celebrex  and methocarbamol , taking them as soon as the pain starts. Due to a limitation of 20 methocarbamol  tablets, he tries to limit his dosage. He does not perform specific exercises for his back but walks daily in the early morning to avoid the heat. He avoids laying flat and prefers to lay on his side with a pillow for support.  He experiences sciatica with pain radiating down his left leg, described as feeling like the veins are 'popping out' due to the severity of the pain. This has been a persistent issue alongside his back pain.  He has not completed a sleep study due to family issues affecting his ability to schedule it.  No current concerns about his blood pressure. He manages his diet by avoiding sugar, sodas, and coffee, and drinks about 24 bottles of water over five days.     Medications: Outpatient Medications Prior to Visit  Medication Sig   albuterol  (VENTOLIN  HFA) 108 (90 Base) MCG/ACT inhaler Inhale 2 puffs into the lungs every 6 (six)  hours as needed for wheezing or shortness of breath.   amLODipine  (NORVASC ) 10 MG tablet Take 1 tablet (10 mg total) by mouth daily for 30 doses.   benzonatate  (TESSALON ) 100 MG capsule Take 1 capsule (100 mg total) by mouth 2 (two) times daily as needed for cough.   celecoxib  (CELEBREX ) 100 MG capsule Take 1 capsule (100 mg total) by mouth 2 (two) times daily.   chlorthalidone  (HYGROTON ) 25 MG tablet Take 1 tablet (25 mg total) by mouth daily.   fluticasone -salmeterol (ADVAIR  HFA) 230-21 MCG/ACT inhaler Inhale 2 puffs into the lungs 2 (two) times daily.   gabapentin  (NEURONTIN ) 300 MG capsule Take 1 capsule (300 mg total) by mouth in the morning AND 1 capsule (300 mg total) daily AND 2 capsules (600 mg total) at bedtime.   ipratropium-albuterol  (DUONEB) 0.5-2.5 (3) MG/3ML SOLN Take 3 mLs by nebulization every 6 (six) hours as needed.   loratadine  (CLARITIN ) 10 MG tablet Take 1 tablet (10 mg total) by mouth daily.   montelukast  (SINGULAIR ) 10 MG tablet Take 1 tablet (10 mg total) by mouth at bedtime.   naloxone (NARCAN) nasal spray 4 mg/0.1 mL Place into the nose.   omeprazole  (PRILOSEC) 20 MG capsule Take 1 capsule (20 mg total) by mouth daily.   rosuvastatin  (CRESTOR ) 10 MG tablet Take 1 tablet (10 mg total) by mouth daily.   Spacer/Aero-Holding Chambers (AEROCHAMBER MINI CHAMBER) DEVI Use as directed   umeclidinium bromide  (INCRUSE ELLIPTA )  62.5 MCG/ACT AEPB Inhale 1 puff into the lungs daily.   valsartan  (DIOVAN ) 80 MG tablet Take 1 tablet (80 mg total) by mouth daily.   [DISCONTINUED] methocarbamol  (ROBAXIN ) 500 MG tablet Take 1 tablet (500 mg total) by mouth every 6 (six) hours as needed for muscle spasms.   [DISCONTINUED] sertraline  (ZOLOFT ) 50 MG tablet Take 1 tablet (50 mg total) by mouth daily.   No facility-administered medications prior to visit.        Objective    There were no vitals taken for this visit.     Physical Exam Constitutional:      General: He is not in  acute distress.    Appearance: Normal appearance. He is not diaphoretic.  HENT:     Head: Normocephalic.  Eyes:     Conjunctiva/sclera: Conjunctivae normal.  Pulmonary:     Effort: Pulmonary effort is normal. No respiratory distress.  Neurological:     Mental Status: He is alert and oriented to person, place, and time. Mental status is at baseline.       Assessment & Plan    Chronic midline low back pain with left-sided sciatica -     Ambulatory referral to Physical Therapy -     Methocarbamol ; Take 1 tablet (500 mg total) by mouth every 6 (six) hours as needed for muscle spasms.  Dispense: 20 tablet; Refill: 1 -     methylPREDNISolone ; Take 6 tablets (24 mg total) by mouth daily for 1 day, THEN 5 tablets (20 mg total) daily for 1 day, THEN 4 tablets (16 mg total) daily for 1 day, THEN 3 tablets (12 mg total) daily for 1 day, THEN 2 tablets (8 mg total) daily for 1 day, THEN 1 tablet (4 mg total) daily for 1 day.  Dispense: 21 tablet; Refill: 0  Primary hypertension  Asthma-COPD overlap syndrome (HCC)  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)  Prediabetes  Recurrent major depressive disorder, in partial remission (HCC) -     Sertraline  HCl; Take 1 tablet (50 mg total) by mouth daily.  Dispense: 90 tablet; Refill: 1      Chronic Low Back Pain with Sciatica Chronic low back pain with sciatica, managed with celecoxib  and methocarbamol . He seeks to avoid surgical intervention.  Will refer him for physical therapy. - Refer to physical therapy for back pain management and exercises. - Prescribe Medrol  taper pack for acute exacerbation. - Advise discontinuation of celecoxib  during prednisone  treatment. - Continue celecoxib  after completion of prednisone  taper. - Prescribe methocarbamol  as needed for muscle relaxation.  Hypertension Chronic, stable.  Blood pressure well-controlled. - Continue valsartan  80 mg daily - Continue amlodipine  10 mg daily - Continue  chlorthalidone  25 mg daily  Asthma-COPD overlap; stage 4 COPD Asthma well-controlled with Advair  and Incruse Ellipta . - Continue Advair  and Incruse Ellipta  as prescribed. - Continue albuterol  as needed - Continue montelukast  10 mg daily - Completed 8 week Pulmonary Rehab program.  - Follows with pulmonology; defer to specialist management  Prediabetes Significant lifestyle modifications noted, including reduced sugar intake and increased water consumption.    Return in about 3 months (around 11/17/2023) for Chronic f/u, labs.     I discussed the assessment and treatment plan with the patient. The patient was provided an opportunity to ask questions and all were answered. The patient agreed with the plan and demonstrated an understanding of the instructions.   The patient was advised to call back or seek an in-person evaluation if the  symptoms worsen or if the condition fails to improve as anticipated.  I provided 12 minutes of virtual-face-to-face time during this encounter.   LAURAINE LOISE BUOY, DO [facility] [phone] (phone) [phone] (fax)  [facility]

## 2023-08-25 NOTE — Therapy (Deleted)
 OUTPATIENT PHYSICAL THERAPY EVALUATION   Patient Name: Larry Maxwell MRN: [id number] DOB:07-Sep-1965, 58 y.o., male Today's Date: 08/25/2023  END OF SESSION:   Past Medical History:  Diagnosis Date   Allergy    Asthma    COPD (chronic obstructive pulmonary disease) (HCC)    Depression    Gynecomastia, male 07/21/2022   Hypertension    Past Surgical History:  Procedure Laterality Date   HERNIA REPAIR     Patient Active Problem List   Diagnosis Date Noted   Acute leg pain, left 04/24/2023   Eczema 04/24/2023   Blood glucose elevated 02/02/2023   Epigastric discomfort 02/02/2023   History of pancreatitis 02/02/2023   Chronic kidney disease, stage 3a (HCC) 02/02/2023   Insomnia due to medical condition 12/15/2022   Mixed restrictive and obstructive lung disease (HCC) 10/25/2022   Asthma-COPD overlap syndrome (HCC) 10/25/2022   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) 10/25/2022   Restrictive lung disease 08/25/2022   Paresthesia of right upper extremity 08/17/2022   History of diverticulosis 08/09/2022   Nicotine dependence with current use 08/04/2022   Prediabetes 08/02/2022   Hypertension 07/21/2022   Asthma, allergic, mild persistent, uncomplicated 07/21/2022   Major depression, recurrent (HCC) 07/21/2022   Mixed hyperlipidemia 07/21/2022   Chronic midline low back pain with left-sided sciatica 07/21/2022    PCP: Donzella Lauraine SAILOR, DO  REFERRING PROVIDER: Donzella Lauraine SAILOR, DO  REFERRING DIAG: chronic midline low back pain with left-sided sciatica  Rationale for Evaluation and Treatment: Rehabilitation  THERAPY DIAG:  No diagnosis found.  ONSET DATE: ***  SUBJECTIVE:                                                                                                                                                                                           SUBJECTIVE STATEMENT: ***  Patient has had back pain for 15-20 years.  PERTINENT HISTORY:   Patient is a 58 y.o. male who presents to outpatient physical therapy with a referral for medical diagnosis chronic midline low back pain with left-sided sciatica. This patient's chief complaints consist of ***, leading to the following functional deficits: ***. 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 he  has a past medical history of Allergy, Asthma, COPD (chronic obstructive pulmonary disease) (HCC), Depression, Gynecomastia, male (07/21/2022), and Hypertension. he  has a past surgical history that includes Hernia repair. Patient denies hx of {redflags:27294}  Exercise history: ***    PAIN: Are you having pain? Yes NPRS: Current: ***/10,  Best: ***/10, Worst: ***/10. Pain location: *** Pain description: *** Aggravating factors: *** Relieving factors: ***   FUNCTIONAL LIMITATIONS: ***  LEISURE: ***  PRECAUTIONS: {Therapy precautions:24002}  WEIGHT BEARING RESTRICTIONS: {Yes ***/No:24003}  FALLS:  Has patient fallen in last 6 months? {fallsyesno:27318}  LIVING ENVIRONMENT: Lives with: {[facility] lives with:25569::lives with their family} Lives in: {Lives in:25570} Stairs: {opstairs:27293} Has following equipment at home: {Assistive devices:23999}  OCCUPATION: ***  PLOF: {PLOF:24004}  PATIENT  GOALS: ***  NEXT MD VISIT: ***  OBJECTIVE  DIAGNOSTIC FINDINGS:  Lumbar xray report from 05/12/2022:  CLINICAL DATA:  Fall   EXAM: LUMBAR SPINE - 3 VIEW   COMPARISON:  10/19/2005   FINDINGS: There is no evidence of lumbar spine fracture. Alignment is normal. Disc space narrowing with marginal osteophyte formation at each lumbar level.   IMPRESSION: Degenerative changes.  No acute osseous abnormalities.  SELF-REPORTED FUNCTION {PROS:32229}  OBSERVATION/INSPECTION Posture Posture (seated): forward head, rounded shoulders, slumped in sitting.  Posture (standing): *** Posture correction: *** Anthropometrics Tremor: none Body composition: *** Muscle bulk: *** Skin: The incision sites appear to be healing well with no excessive redness, warmth, drainage or signs of infection present.  *** Edema: *** Functional Mobility Bed mobility: *** Transfers: *** Gait: grossly WFL for household and short community ambulation. More detailed gait analysis deferred to later date as needed. *** Stairs: ***  SPINE MOTION  LUMBAR SPINE AROM *Indicates pain Flexion: *** Extension: *** Side Flexion:   R ***  L *** Rotation:  R *** L *** Side glide:  R *** L ***    NEUROLOGICAL  Upper Motor Neuron Screen Babinski, Hoffman's and Clonus (ankle) negative bilaterally.  Dermatomes C2-T1 appears equal and intact to light touch except the following: *** L2-S2 appears equal and intact to light touch except the following: *** Deep Tendon Reflexes R/L  ***+/***+ Biceps brachii reflex (C5, C6) ***+/***+ Brachioradialis reflex (C6) ***+/***+ Triceps brachii reflex (C7) ***+/***+ Quadriceps reflex (L4) ***+/***+ Achilles reflex (S1)  SPINE MOTION  CERVICAL SPINE AROM *Indicates pain Flexion: *** Extension: *** Side Flexion:   R ***  L *** Rotation:  R *** L *** Protrusion: *** Retraction: ***   PERIPHERAL JOINT MOTION (in degrees)  ACTIVE RANGE OF MOTION  (AROM) *Indicates pain Date Date Date  Joint/Motion R/L R/L R/L  Shoulder     Flexion / / /  Extension / / /  Abduction  / / /  External rotation / / /  Internal rotation / / /  Elbow     Flexion  / / /  Extension  / / /  Wrist     Flexion / / /  Extension  / / /  Radial deviation / / /  Ulnar deviation / / /  Pronation / / /  Supination / / /  Hip     Flexion / / /  Extension  / / /  Abduction / / /  Adduction / / /  External rotation / / /  Internal rotation  / / /  Knee     Extension / / /  Flexoin / / /  Ankle/Foot     Dorsiflexion (knee ext) / / /  Dorsiflexion (knee flex) / / /  Plantarflexion / / /  Everison / / /  Inversion / / /  Great toe extension / / /  Great toe flexion / / /  Comments:   PASSIVE RANGE OF MOTION (PROM) *Indicates pain Date Date Date  Joint/Motion R/L R/L R/L  Shoulder     Flexion / / /  Extension / / /  Abduction  / / /  External rotation / / /  Internal rotation / / /  Elbow     Flexion  / / /  Extension  / / /  Wrist     Flexion / / /  Extension  / / /  Radial deviation / / /  Ulnar deviation / / /  Pronation / / /  Supination / / /  Hip     Flexion  / / /  Extension  / / /  Abduction / / /  Adduction / / /  External rotation / / /  Internal rotation  / / /  Knee     Extension / / /  Flexion / / /  Ankle/Foot     Dorsiflexion (knee ext) / / /  Dorsiflexion (knee flex) / / /  Plantarflexion / / /  Everison / / /  Inversion / / /  Great toe extension / / /  Great toe flexion / / /  Comments:   MUSCLE PERFORMANCE (MMT):  *Indicates pain Date Date Date  Joint/Motion R/L R/L R/L  Shoulder     Flexion / / /  Abduction (C5) / / /  External rotation / / /  Internal rotation / / /  Extension / / /  Periscapular     Upper Trap / / /  Middle Trap / / /  Lower Trap / / /  Elbow     Flexion (C6) / / /  Extension (C7) / / /  Wrist     Flexion (C7) / / /  Extension (C6) / / /  Radial deviation / / /   Ulnar deviation (C8) / / /  Pronation / / /  Supination / / /  Hand     Thumb extension (C8) / / /  Finger abduction (T1) / / /  Grip (C8) / / /  Hip     Flexion (L1, L2) / / /  Extension (knee ext) / / /  Extension (knee flex) / / /  Abduction / / /  Adduction / / /  External rotation / / /  Internal rotation  / / /  Knee     Extension (L3) / / /  Flexion (S2) / / /  Ankle/Foot     Dorsiflexion (L4) / / /  Great toe extension (L5) / / /  Eversion (S1) / / /  Plantarflexion (S1) / / /  Inversion / / /  Pronation / / /  Great toe flexion / / /  Comments:   SPECIAL TESTS:  .Neurodynamictests .NeurodynamicUE .NeurodynamicLE .CspineInstability .CSPINESPECIALTESTS .SHOULDERSPECIALTESTCLUSTERS .HIPSPECIALTESTS .SIJSPECIALTESTS   SHOULDER SPECIAL TESTS RTC, Impingement, Anterior Instability (macrotrauma), Labral Tear: Painful arc test: R = ***, L = ***. Drop arm test: R = ***, L = ***. Hawkins-Kennedy test: R = ***, L = ***. Infraspinatus test: R = ***, L = ***. Apprehension test: R = ***, L = ***.  Relocation test: R = ***, L = ***. Active compression test: R = ***, L = ***.  ACCESSORY MOTION: ***  PALPATION: ***  SUSTAINED POSITIONS TESTING:  ***  REPEATED MOTIONS TESTING: ***  FUNCTIONAL/BALANCE TESTS: Five Time Sit to Stand (5TSTS): *** seconds Functional Gait Assessment (FGA): ***/30 (see details above) Ten meter walking trial ( ): *** m/s Six Minute Walk Test ( ): *** feet Timed Up and Go (TUG): *** seconds   Dynamic Gait Index: ***/24 BERG Balance Scale: ***/56 Tinetti/POMA: ***/28 Timed Up and GO: *** seconds (average of 3 trials) Trial 1: *** Trial 2: *** Trial 3: *** Romberg test: -Narrow stance, eyes open: *** seconds -Narrow stance, eyes closed: *** seconds Sharpened Romberg test: -Tandem stance, eyes open: *** seconds -Tandem stance, eyes closed: *** seconds  Narrow stance, firm surface, eyes open: *** seconds Narrow  stance, firm surface, eyes closed: *** seconds Narrow stance, compliant surface, eyes open: *** seconds Narrow stance, compliant surface, eyes closed: *** seconds Single leg stance, firm surface, eyes open: R= *** seconds, L= *** seconds Single leg stance, compliant surface, eyes open: R= *** seconds, L= *** seconds Gait speed: *** m/s Functional reach test: *** inches      TREATMENT                                                                                                                               PATIENT EDUCATION:  Education details: *** Person educated: {Person educated:25204} Education method: {Education Method:25205} Education comprehension: {Education Comprehension:25206}  HOME EXERCISE PROGRAM: ***  ASSESSMENT:  CLINICAL IMPRESSION: Patient is a 58 y.o. male referred to outpatient physical therapy with a medical diagnosis of chronic midline low back pain with left-sided sciatica who presents with signs and symptoms consistent with ***. Patient presents with significant *** impairments that are limiting ability to complete *** without difficulty. Patient will benefit from skilled physical therapy intervention to address current body structure impairments and activity limitations to improve function and work towards goals set in current POC in order to return to prior level of function or maximal functional improvement.    OBJECTIVE IMPAIRMENTS: {opptimpairments:25111}.   ACTIVITY LIMITATIONS: {activitylimitations:27494}  PARTICIPATION LIMITATIONS: {participationrestrictions:25113}  PERSONAL FACTORS: {Personal factors:25162} are also affecting patient's functional outcome.   REHAB POTENTIAL: {rehabpotential:25112}  CLINICAL DECISION MAKING: {clinical decision making:25114}  EVALUATION COMPLEXITY: {Evaluation complexity:25115}   GOALS: Goals reviewed with patient? {yes/no:20286}  SHORT TERM GOALS: Target date: 09/08/2023  Patient will be independent  with initial home exercise program for self-management of symptoms. Baseline: {HEPbaseline4:27310} (08/25/23); Goal status: INITIAL  LONG TERM GOALS: Target date: 11/17/2023  Patient will be independent with a long-term home exercise program for self-management of symptoms.  Baseline: {HEPbaseline4:27310} (08/25/23); Goal status: INITIAL  2.  Patient will demonstrate improved {SarasLTGPRO:32233} to demonstrate improvement in overall condition and self-reported functional ability.  Baseline: {Sarasgoalbaseline:32234} (08/25/23); Goal status: INITIAL  3.  *** Baseline: {Sarasgoalbaseline:32234} (08/25/23); Goal status: INITIAL  4.  ***  Baseline: {Sarasgoalbaseline:32234} (08/25/23); Goal status: INITIAL  5.  Patient will demonstrate improvement in Patient Specific Functional Scale (PSFS) of equal or greater than 8/10 points to reflect clinically significant improvement in patient's most valued functional activities. Baseline: {Sarasgoalbaseline:32234} (08/25/23); Goal status: INITIAL  6.  Patient will report NPRS equal or less than 3/10 during functional activities during the last 2 weeks to improve their abilitly to complete community, work and/or recreational activities with less limitation. Baseline: ***/10 (08/25/23); Goal status: INITIAL   PLAN:  PT FREQUENCY: {rehab frequency:25116}  PT DURATION: {rehab duration:25117}  PLANNED INTERVENTIONS: {rehab planned interventions:25118::97110-Therapeutic exercises,97530- Therapeutic [phone]- Neuromuscular re-education,97535- Self Rjmz,02859- Manual therapy}.  PLAN FOR NEXT SESSION: ***   Camie SAUNDERS. Juli, PT, DPT, Cert. MDT 08/25/23, 5:45 PM  [facility] [address] [place], [place] [address] P: [phone] I F: [phone]

## 2023-08-29 ENCOUNTER — Ambulatory Visit: Payer: MEDICAID | Admitting: Physical Therapy

## 2023-08-29 ENCOUNTER — Other Ambulatory Visit: Payer: Self-pay

## 2023-08-31 NOTE — Therapy (Signed)
 OUTPATIENT PHYSICAL THERAPY EVALUATION   Patient Name: Larry Maxwell MRN: [id number] DOB:[birth year], 58 y.o., male Today's Date: 09/05/2023  END OF SESSION:  PT End of Session - 09/05/23 0952     Visit Number 1    Number of Visits 17    Date for PT Re-Evaluation 11/28/23    Authorization Type [employer] reporting period from 09/05/2023    PT Start Time 0941    PT Stop Time 1037    PT Time Calculation (min) 56 min    Activity Tolerance Patient limited by pain    Behavior During Therapy [facility] for tasks assessed/performed          Past Medical History:  Diagnosis Date   Allergy    Asthma    COPD (chronic obstructive pulmonary disease) (HCC)    Depression    Gynecomastia, male 07/21/2022   Hypertension    Past Surgical History:  Procedure Laterality Date   HERNIA REPAIR     Patient Active Problem List   Diagnosis Date Noted   Acute leg pain, left 04/24/2023   Eczema 04/24/2023   Blood glucose elevated 02/02/2023   Epigastric discomfort 02/02/2023   History of pancreatitis 02/02/2023   Chronic kidney disease, stage 3a (HCC) 02/02/2023   Insomnia due to medical condition 12/15/2022   Mixed restrictive and obstructive lung disease (HCC) 10/25/2022   Asthma-COPD overlap syndrome (HCC) 10/25/2022   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) 10/25/2022   Restrictive lung disease 08/25/2022   Paresthesia of right upper extremity 08/17/2022   History of diverticulosis 08/09/2022   Nicotine dependence with current use 08/04/2022   Prediabetes 08/02/2022   Hypertension 07/21/2022   Asthma, allergic, mild persistent, uncomplicated 07/21/2022   Major depression, recurrent (HCC) 07/21/2022   Mixed hyperlipidemia 07/21/2022   Chronic midline low back pain with left-sided sciatica 07/21/2022    PCP: Donzella Lauraine SAILOR, DO  REFERRING PROVIDER: Donzella Lauraine SAILOR, DO  REFERRING DIAG: chronic midline low back pain with left-sided sciatica  Rationale for  Evaluation and Treatment: Rehabilitation  THERAPY DIAG:  Other low back pain  Pain in thoracic spine  Neuralgia and neuritis  Pain in left thigh  Difficulty in walking, not elsewhere classified  ONSET DATE: chronic but current exacerbation 2-3 months prior to initial PT evaluation.   SUBJECTIVE:                                                                                                                                                                                           SUBJECTIVE STATEMENT: Patient has had back pain for 15-20 years.  He did  PT 12 years ago for his low back and it was not helpful.    What are your expectations for today?  Just to relieve some pain What is presently your main problem? Lower back pain and pain shooting down his left leg.  How long has this episode been going on? 2-3 months Onset? Unsure. When did you first notice it? 2-3 months ago. He was going to PT at the hospital. When he was doing that it was illuminating the pain.  Have you had this before? Yes. He was lifting heavy items that brings on the pain. He went to PT 12 years ago and it didn't help. He was told to quit his job or go on light duty. He went on light duty for 5 years. His doctor then recommended surgery and he refused to have surgery. His pain started 12 years ago and he didn't have a known injury. He has been taking medication since then to deal with the pain.  Activities that caused episodes? Nothing specific, but he feels significantly worse after going up stairs.  Location? Central low back and down left leg. Where do you feel most symptoms? Posterior L knee, lateral. Denies going distal to L knee or to right LE. Anything other than pain (weakness or paresthesias)? no How would you describe the pain? Aches in leg and sharp in back.  Describe your symptoms on a 24 hours clock: prays first, okay until he stands up from bed, takes gabapentin  and robaxin  (1 in morning, 1 at lunch, 2  at bedtime), hot shower helps a little (eased after shower and pill), pain steady until 2-6pm then gets throbbing, after he takes his meds at night which knocks him out).  What time of day is the worst for you? 3-4pm The best? When asleep.  Aggravating Factors: What Movements, Activities, Positions, Situations (MAPS) make you feel worse or bring on your symptoms? Laying down at night, going up stairs, bending over the wrong way (that is when he feels the sciatic nerve issue), walking, standing up, pain never goes away.  Easing factors: pillow at back when sitting, elevating legs in chair, being still, hot/cold compress, hot shower, medications (but he thinks he is overdosing himself on the medications, so he thinks his body has gotten used to it and it is not helping any more, he does not take narcotics).  Tolerance: "How long can you go out before symptoms get worse?" going out and back to the store and up to his apartment the pain gets so bad he cannot stand it.  Reactivity/Irritability- How long does a flare up last? 45 min to calm down after going out.  Takes his meds in astrict schedule: 7, 12, 6pm   He only goes to his doctor's office every other day and go to [place] and go back home. He lives in an apartment. He has stage 4 lung disease. He does not think the lung cancer has spread to his spine. He was getting shots in his low back and that did not help. It hurt worse. He was doing that every 2 months. He stopped because he thought it was hurting the bone in his back. Maybe 7 years when he had his last injection. He has tried heating pad on his back, he does hot and cold compresses on leg and back. He was walking about 1/2 a mile a day and the pain was there but not nearly as bad as now.   Special Questions:  . episodes  of extreme dizziness, fainting or blackouts? Passed out because of the heat once after going to [place]. He was waiting for his ride. Then was okay in the car that was air  conditioned. He almost passed out but his neighbor helped him upt the stairs.   SABRA extreme apprehension of flexing the head forward or unable to lie down flat? Hard to breathe lying on his back. He has Stage 4 lung disease and has asthma as well. He takes 3 inhalers.   . bilateral numbness in the hands and/or feet with movement of the head? A little tingling in fingers, but for the last 4-5 days he feels like he cannot move his head due to a crik in his left neck.   What would you need to accomplish with us  to feel like you absolutely crushed rehab (specific, measurable goal)? I want to be able to do regular things, walk out and lift things He does not even lift his 13 lb cat He has to get someone to take cases of water up to the apartment. He would like to make it through all of the sessions and make it to a good outcome.   PERTINENT HISTORY:  Patient is a 58 y.o. male who presents to outpatient physical therapy with a referral for medical diagnosis chronic midline low back pain with left-sided sciatica. This patient's chief complaints consist of low back pain and left leg pain, leading to the following functional deficits: difficulty with usual activities such as sleeping, rising, walking, ADLs, IADLs, stairs, standing, homemaking, social life, traveling, personal care, lifting, bending, and sitting. 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 he  has a past medical history of Allergy, Asthma, COPD (chronic obstructive pulmonary disease) (HCC), Depression, Gynecomastia, male (07/21/2022), and Hypertension. he  has a past surgical history that includes Hernia repair.  He just finished treatment for his lung disease. He finished pulmonary rehab back in January. Patient denies hx of cancer, stroke, seizures, heart problems, diabetes, unexplained weight loss, unexplained changes in bowel or bladder problems, unexplained stumbling or dropping things, osteoporosis, and spinal surgery  Exercise history: completed pulmonary rehab in June (did it in 2 months instead of 4 because he went daily).     PAIN: Are you having pain? Yes NPRS: Current: 5/10,  Best: 2/10, Worst: 8/10. Pain location: Central low back and down left leg to knee Pain description: Aches in leg and sharp in back.  Aggravating factors: Laying down at night, going up stairs, bending over the wrong way (that is when he feels the sciatic nerve issue), walking, standing up, pain never goes away.  Easing factors: pillow at back when sitting, elevating legs in chair, being still, hot/cold compress, hot shower, medications (but he thinks he is overdosing himself on the medications, so he thinks his body has gotten used to it and it is not helping any more, he does not take narcotics).   FUNCTIONAL LIMITATIONS: difficulty with usual activities such as  sleeping, rising, walking, ADLs, IADLs, stairs, standing, homemaking, social life, traveling, personal care, lifting, bending, and sitting   PRECAUTIONS: don't be out in the heat. Avoid extreme cold weather.   WEIGHT BEARING RESTRICTIONS: No  FALLS:  Has patient fallen in last 6 months? Yes. Number of falls 2  LIVING ENVIRONMENT: Lives with: alone  Lives in: apartment Stairs: 19 steps to enter,  handrail on right Has following equipment at home: none but he thinks he needs one.   OCCUPATION: disability  PATIENT GOALS: I want to be able to do regular things, walk out and lift things He does not even lift his 13 lb cat He has to get someone to take cases of water up to the apartment. He would like to make it through all of the sessions and make it to a good outcome.    OBJECTIVE  DIAGNOSTIC FINDINGS:  Lumbar xray report from 05/12/2022:  CLINICAL DATA:  Fall   EXAM: LUMBAR SPINE - 3 VIEW   COMPARISON:  10/19/2005   FINDINGS: There is no evidence of lumbar spine fracture. Alignment is normal. Disc space narrowing with marginal osteophyte formation at each lumbar level.   IMPRESSION: Degenerative changes.  No acute osseous abnormalities.  Lumbar xray report from 09/22/2018:  EXAM:  XR Lumbar Spine, 2 View.   CLINICAL HISTORY:  2 view (Tech: tec [phone] )  ? Facet disease/lumbar spondylosis - need to delineate levels of facet hypertophy for procedural intervention   COMPARISON:  None provided.   FINDINGS:   BONES:  No acute fracture or focal osseous lesion.  Presumably old fracture or deformity of the left L1 transverse process.  Facet hypertrophy at L5-S1.  Minimal dextroscoliosis of lumbar spine.   DISCS / DEGENERATIVE CHANGES:  Diffuse disc height loss but more significant L5-S1.  Central canal and foraminal narrowing at L5-S1 and L4-L5.  Patient changes of the SI joints and hip joints.   SOFT TISSUES:  The soft tissues are unremarkable.   IMPRESSION:   No acute lumbar spine osseous abnormality.  Facet hypertrophy at L5-S1.   Diffuse disc height loss but more significant L5-S1.  Central canal and foraminal narrowing at L5-S1 and L4-L5.   SELF-REPORTED FUNCTION Modified Oswestry Disability Index (mODI): 56% (range 0-100%)  STANDING Observation Posture: slightly increased muscle bulk or slight lumbar rotation to right at lumbar spine. Patient  avoiding weight bearing on L LE with heel lifted and foot rested on floor. No lumbar lateral shift. Movement patterns and painful movements Bed mobility: sit <> sidelying I but painful, groaning, shaking R foot. Transfers: sit <> stand I but with increased pain, effort, and use of B UE while avoiding weight bearing through L LE.  Gait: ambulatory in clinic and to/from vehicle without AD. Antalgic gait on L with attempt to avoid weight bearing with heel up. More detailed gait analysis deferred to later date as needed.  Stairs: NT Lumbar AROM (instructed to stop when pain increases) Sidedbending:  R 10% left 10% with a lot of increased pain left low back Extension: 20% pain in left low back.  Deferred other movements due to irritability.  Traction Alleviation Test Neutral: increased pain.   Discontinued standing due to irritability  SITTING Neurological Testing Neural Tension Testing Deferred due to irritability: pt keeps L knee in about 30 degrees flexion and finds it painful to flex.  Reflex Testing Deferred after patient unable to relax L quad Myotome Testing L ankle DF 3/5, L great toe extension 3/5  Discontinued  sitting due to irritability  SUPINE Deferred supine position due to irritability  SIDELYING (R side up, pillow between legs) Neurological Testing Neural Tension Testing Deferred due to irritability: pt keeps L knee in about 30 degrees flexion and finds it painful to flex.  Reflex Testing (DTR) Achilles (S1):  R: 2+ L: 1+ Quads: unable to test due to unable to relax L quad Myotome Testing L ankle PF, DF, Eversion, and great toe extension 3/5 and painful.  R ankle PF, DF, Eversion, and great toe extension 4+/5 Sensation:  L lower leg diffusely decreased to light touch compared to R which is normal.  Palpation Muscle irritability and guarding Painful to light touch along spine from approx T4 distally along spinous processes and left spinal paraspinal muscles  (worse a as moving caudal).  Increased tension and pain to deeper palpation along L QL, multifidi, erector spinae. Deferred glute and pelvis palpation due to irritability.  Accessory Motion CPA Attempted CPA along thoracic spine, which appeared hypomobile and painful from T4 down.  Unable to fully assess motion at lower thoracic and lumbar levels due to high levels of irritability to touch.  Patient with tears coming from his eyes in this position but reported it is his most comfortable position.   PRONE Deferred prone position due to irritability    TREATMENT        Manual therapy: to reduce pain and tissue tension, improve range of motion, neuromodulation, in order to promote improved ability to complete functional activities. SIDELYING (R side up with pillow between legs - most comfortable position per patient report).  Gentle STM to left lumbar paraspinals and QL with gentle oscillation and gliding over skin to help decrease muscle tension and pain. Also completed education (as noted below) during this.   Pt reported improvement in hypersensitivity to touch and decreased pain following manual therapy.   Self-Care/Home Management Training: to educate patient in self care including ADL training, meal preparation, compensatory training, safety procedures/instructions, use of assistive technology devices or adaptive equipment.  Patient educated on log roll technique when going sidelying to sit for use at home to decrease irritation.     PATIENT EDUCATION:  Education details: Dentist. Education on diagnosis, prognosis, POC, anatomy and physiology of current condition.  Person educated: Patient Education method: Explanation Education comprehension: verbalized understanding and needs further education  HOME EXERCISE PROGRAM: TBD  ASSESSMENT:  CLINICAL IMPRESSION: Patient is a 58 y.o. male referred to outpatient physical therapy with a medical diagnosis of chronic  midline low back pain with left-sided sciatica who presents with signs and symptoms consistent with chronic low back pain with L sided radiculopathy. Patient very irritable today so physical exam limited to his tolerance. He demonstrated decreased myotomes at the L foot (ankle DF, PF, eversion, and great toe extension), decreased achilles DTR (S1), and lower leg/foot sensation on left compared to right. Unable to tolerate nerve tension testing today, but pt has increased pain with knee flexion and is unable to relax quad. He had increased tension muscles of thoracic spine and low back, L lumbar paraspinals and QL the worst. He had decreased mobility to CPA attempt in thoracic spine and was unable to tolerate pressure to assess that in lumbar spine. He had allodynia to light touch over thoracic spine and left lumbar spine. He demonstrated avoidance of weight bearing on L LE which significantly limits his mobility and increases risk for falls. He likely has load and nerve tension sensitivity possibly with multidirectional position  sensitivity. Patient presents with significant pain, ROM, nerve tension, joint stiffness, gait, balance, muscle tension, sensation, motor control, knowledge, muscle performance (strength/power/endurance), DTR, myotome, and activity tolerance impairments that are limiting ability to complete his usual activities such as sleeping, rising, walking, ADLs, IADLs, stairs, standing, homemaking, social life, traveling, personal care, lifting, bending, and sitting without difficulty. Patient will benefit from skilled physical therapy intervention to address current body structure impairments and activity limitations to improve function and work towards goals set in current POC in order to return to prior level of function or maximal functional improvement.    OBJECTIVE IMPAIRMENTS: Abnormal gait, cardiopulmonary status limiting activity, decreased activity tolerance, decreased balance, decreased  coordination, decreased endurance, decreased knowledge of condition, decreased knowledge of use of DME, decreased mobility, difficulty walking, decreased ROM, decreased strength, hypomobility, increased muscle spasms, impaired flexibility, impaired sensation, impaired tone, improper body mechanics, and pain.   ACTIVITY LIMITATIONS: carrying, lifting, bending, sitting, standing, squatting, sleeping, stairs, transfers, bed mobility, dressing, hygiene/grooming, and locomotion level  PARTICIPATION LIMITATIONS: meal prep, cleaning, laundry, interpersonal relationship, driving, shopping, community activity, and occupation  PERSONAL FACTORS: Fitness, Past/current experiences, Time since onset of injury/illness/exacerbation, and 3+ comorbidities:  Hypertension; Asthma, allergic, mild persistent, uncomplicated; Major depression, recurrent (HCC); Mixed hyperlipidemia; Chronic midline low back pain with left-sided sciatica; Prediabetes; Nicotine dependence with current use; History of diverticulosis; Paresthesia of right upper extremity; Restrictive lung disease; Mixed restrictive and obstructive lung disease (HCC); Asthma-COPD overlap syndrome (HCC); 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 he  has a past medical history of Allergy, Asthma, COPD (chronic obstructive pulmonary disease) (HCC), Depression, Gynecomastia, male (07/21/2022), and Hypertension. he  has a past surgical history that includes Hernia repair are also affecting patient's functional outcome.   REHAB POTENTIAL: Good  CLINICAL DECISION MAKING: Evolving/moderate complexity  EVALUATION COMPLEXITY: Moderate   GOALS: Goals reviewed with patient? No  SHORT TERM GOALS: Target date: 09/19/2023  Patient will be independent with initial  home exercise program for self-management of symptoms. Baseline: Initial HEP to be provided at visit 2 as appropriate (09/05/23); Goal status: INITIAL  LONG TERM GOALS: Target date: 11/28/2023  Patient will be independent with a long-term home exercise program for self-management of symptoms.  Baseline: Initial HEP to be provided at visit 2 as appropriate (09/05/23); Goal status: INITIAL  2.  Patient will demonstrate improved Oswestry Disability Index (ODI) to equal or less than 10% to demonstrate improvement in overall condition and self-reported functional ability.  Baseline: 56% (09/05/23); Goal status: INITIAL  3.  Patient will demonstrate full lumbar and LE ROM without increased symptoms beyond mild intermittent end range discomfort to improve his ability to perform ADLs and IADLs.  Baseline: severely limited where measured - see objective (09/05/23); Goal status: INITIAL  4.  Patient will demonstrate B LE strength equal bilaterally or at least 4+/5 to improve his ability to complete ADLs, IADLs, and leisure activities such as daily walking with less difficulty.  Baseline: L ankle/great toe myotomes 3/5 R ankle/great toe myotomes 4+/5 (09/05/23); Goal status: INITIAL  6.  Patient will report NPRS equal or less than 5/10 during functional activities during the last 2 weeks to improve their abilitly to complete community, work and/or recreational activities with less limitation. Baseline: 8/10 (09/05/23); Goal status: INITIAL   PLAN:  PT FREQUENCY: 2x/week  PT  DURATION: 12 weeks  PLANNED INTERVENTIONS: 97164- PT Re-evaluation, 97750- Physical Performance Testing, 97110-Therapeutic exercises, 97530- Therapeutic activity, 97112- Neuromuscular re-education, 97535- Self Care, 02859- Manual therapy, [phone]- Gait training, [phone]- Aquatic Therapy, [phone]- Electrical stimulation (unattended), [phone]- Traction (mechanical), [phone] (1-2 muscles), 20561 (3+ muscles)- Dry Needling, Patient/Family  education, Balance training, Stair training, Joint mobilization, Spinal mobilization, DME instructions, Cryotherapy, and Moist heat.  PLAN FOR NEXT SESSION: update HEP as appropriate, interventions for pain relief and to decrease muscle guarding, further assessment as appropriate, education on mechanical stressors and modifications in daily life, progressing to retraining of motor control, rebuilding of strength and functional ability as tolerance improves. Education. Manual therapy.    Camie SAUNDERS. Juli, PT, DPT, Cert. MDT 09/05/23, 12:37 PM  [facility] [address] [place], [place] [address] P: [phone] I F: [phone]

## 2023-09-05 ENCOUNTER — Ambulatory Visit: Payer: MEDICAID | Admitting: Physical Therapy

## 2023-09-05 ENCOUNTER — Encounter: Payer: Self-pay | Admitting: Physical Therapy

## 2023-09-05 ENCOUNTER — Ambulatory Visit: Payer: MEDICAID | Attending: Family Medicine | Admitting: Physical Therapy

## 2023-09-05 DIAGNOSIS — M5442 Lumbago with sciatica, left side: Secondary | ICD-10-CM | POA: Diagnosis not present

## 2023-09-05 DIAGNOSIS — M792 Neuralgia and neuritis, unspecified: Secondary | ICD-10-CM | POA: Insufficient documentation

## 2023-09-05 DIAGNOSIS — R262 Difficulty in walking, not elsewhere classified: Secondary | ICD-10-CM | POA: Insufficient documentation

## 2023-09-05 DIAGNOSIS — M79652 Pain in left thigh: Secondary | ICD-10-CM | POA: Diagnosis present

## 2023-09-05 DIAGNOSIS — M546 Pain in thoracic spine: Secondary | ICD-10-CM | POA: Insufficient documentation

## 2023-09-05 DIAGNOSIS — M5459 Other low back pain: Secondary | ICD-10-CM | POA: Diagnosis present

## 2023-09-05 DIAGNOSIS — G8929 Other chronic pain: Secondary | ICD-10-CM | POA: Diagnosis not present

## 2023-09-07 ENCOUNTER — Encounter: Payer: Self-pay | Admitting: Physical Therapy

## 2023-09-07 ENCOUNTER — Ambulatory Visit: Payer: MEDICAID | Admitting: Physical Therapy

## 2023-09-07 VITALS — BP 126/88 | HR 81

## 2023-09-07 DIAGNOSIS — M5459 Other low back pain: Secondary | ICD-10-CM | POA: Diagnosis not present

## 2023-09-07 DIAGNOSIS — M546 Pain in thoracic spine: Secondary | ICD-10-CM

## 2023-09-07 DIAGNOSIS — M79652 Pain in left thigh: Secondary | ICD-10-CM

## 2023-09-07 DIAGNOSIS — R262 Difficulty in walking, not elsewhere classified: Secondary | ICD-10-CM

## 2023-09-07 DIAGNOSIS — M792 Neuralgia and neuritis, unspecified: Secondary | ICD-10-CM

## 2023-09-07 NOTE — Therapy (Signed)
 OUTPATIENT PHYSICAL THERAPY TREATMENT   Patient Name: Larry Maxwell MRN: [id number] DOB:1965-11-24, 58 y.o., male Today's Date: 09/07/2023  END OF SESSION:  PT End of Session - 09/07/23 1259     Visit Number 2    Number of Visits 17    Date for PT Re-Evaluation 11/28/23    Authorization Type [employer] reporting period from 09/05/2023    Authorization Time Period [employer] [id number] for 8 PT vsts from 8/13-03/05/24    Authorization - Visit Number 1    Authorization - Number of Visits 8    Progress Note Due on Visit 10    PT Start Time 1300    PT Stop Time 1345    PT Time Calculation (min) 45 min    Activity Tolerance Patient limited by pain    Behavior During Therapy [facility] for tasks assessed/performed           Past Medical History:  Diagnosis Date   Allergy    Asthma    COPD (chronic obstructive pulmonary disease) (HCC)    Depression    Gynecomastia, male 07/21/2022   Hypertension    Past Surgical History:  Procedure Laterality Date   HERNIA REPAIR     Patient Active Problem List   Diagnosis Date Noted   Acute leg pain, left 04/24/2023   Eczema 04/24/2023   Blood glucose elevated 02/02/2023   Epigastric discomfort 02/02/2023   History of pancreatitis 02/02/2023   Chronic kidney disease, stage 3a (HCC) 02/02/2023   Insomnia due to medical condition 12/15/2022   Mixed restrictive and obstructive lung disease (HCC) 10/25/2022   Asthma-COPD overlap syndrome (HCC) 10/25/2022   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) 10/25/2022   Restrictive lung disease 08/25/2022   Paresthesia of right upper extremity 08/17/2022   History of diverticulosis 08/09/2022   Nicotine dependence with current use 08/04/2022   Prediabetes 08/02/2022   Hypertension 07/21/2022   Asthma, allergic, mild persistent, uncomplicated 07/21/2022   Major depression, recurrent (HCC) 07/21/2022   Mixed hyperlipidemia 07/21/2022   Chronic midline low back pain with  left-sided sciatica 07/21/2022    PCP: Donzella Lauraine SAILOR, DO  REFERRING PROVIDER: Donzella Lauraine SAILOR, DO  REFERRING DIAG: chronic midline low back pain with left-sided sciatica  Rationale for Evaluation and Treatment: Rehabilitation  THERAPY DIAG:  Other low back pain  Pain in thoracic spine  Neuralgia and neuritis  Pain in left thigh  Difficulty in walking, not elsewhere classified  ONSET DATE: chronic but current exacerbation 2-3 months prior to initial PT evaluation.   SUBJECTIVE:  PERTINENT HISTORY:  Patient is a 58 y.o. male who presents to outpatient physical therapy with a referral for medical diagnosis chronic midline low back pain with left-sided sciatica. This patient's chief complaints consist of low back pain and left leg pain, leading to the following functional deficits: difficulty with usual activities such as sleeping, rising, walking, ADLs, IADLs, stairs, standing, homemaking, social life, traveling, personal care, lifting, bending, and sitting. 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 he  has a past  medical history of Allergy, Asthma, COPD (chronic obstructive pulmonary disease) (HCC), Depression, Gynecomastia, male (07/21/2022), and Hypertension. he  has a past surgical history that includes Hernia repair.  He just finished treatment for his lung disease. He finished pulmonary rehab back in January. Patient denies hx of cancer, stroke, seizures, heart problems, diabetes, unexplained weight loss, unexplained changes in bowel or bladder problems, unexplained stumbling or dropping things, osteoporosis, and spinal surgery  Exercise history: completed pulmonary rehab in June (did it in 2 months instead of 4 because he went daily).   SUBJECTIVE STATEMENT: Patient states he felt good and went home and took some pills after last PT session. He took a muscle relaxer this morning.   PAIN: Are you having pain?  NPRS: Current: 5/10 low back and down left leg to the knee, most in the low back.   From initial evaluation 09/05/23:  Best: 2/10, Worst: 8/10. Pain location: Central low back and down left leg to knee Pain description: Aches in leg and sharp in back.  Aggravating factors: Laying down at night, going up stairs, bending over the wrong way (that is when he feels the sciatic nerve issue), walking, standing up, pain never goes away.  Easing factors: pillow at back when sitting, elevating legs in chair, being still, hot/cold compress, hot shower, medications (but he thinks he is overdosing himself on the medications, so he thinks his body has gotten used to it and it is not helping any more, he does not take narcotics).   PRECAUTIONS: don't be out in the heat. Avoid extreme cold weather. Has stage 4 COPD and restrictive disease.  PATIENT GOALS: I want to be able to do regular things, walk out and lift things He does not even lift his 13 lb cat He has to get someone to take cases of water up to the apartment. He would like to make it through all of the sessions and make it to a good outcome.     OBJECTIVE  Vitals:   09/07/23 1303  BP: 126/88  Pulse: 81  SpO2: 100%   NEUROLOGIC TESTING SLUMP Flexion: Left tension before right, both relives by neck extension.  Extension:  left tension > R, not adhesed  Sidelying L femoral nerve tension test L only tested: unclear due to guarding and pain with several motions.   Crossed SLR:  Positive for concordant left low back pain with R SLR  SLR:  L positive from supine and 90/90   PERIPHERAL  JOINT MOTION Supine L knee PROM Full extension in supine, near full flexion without pain when hip flexed  Supine L hip PROM abduction feels fine.    TREATMENT        Therapeutic exercise: therapeutic exercises that incorporate ONE parameter at one or more areas of the body to centralize symptoms, develop strength and endurance, range of motion, and flexibility required for successful completion of functional activities. Vitals measurement for systems review (see above)  Education on HEP including handout    Manual therapy: to reduce pain and tissue tension, improve range of motion, neuromodulation, in order to promote improved ability to complete functional activities. HOOKLYING  Lumbar manual traction with mob belt behind knees 10x10 seconds on/off  Neuromuscular Re-education: a technique or exercise performed with the goal of improving the level of communication between the body and the brain, such as for balance, motor control, muscle activation patterns, coordination, desensitization, quality of muscle contraction, proprioception, and/or kinesthetic sense needed for successful and safe completion of functional activities.   Neurologic testing to better understand nerve irritation (see above)  Hooklying pelvic tilt AROM with goal of finding pain free range and isolating needed muscles while relaxing compensatory and unneccessary muscle use Practiced with tactile and verbal feedback until was able to perform motion then at least  10 reps in pain free range. Still with excessive muscle activation and lacks ADIM during PPT  Hooklying diaphragmatic breathing Practice until understood how to perform, then inconsistent reps for 10-20 reps  Education on nerve sensitivity and better positioning to stop irritating it.  Therapeutic activities: dynamic therapeutic activities incorporating MULTIPLE parameters or areas of the body designed to achieve improved functional performance. Patient practiced log roll technique when going supine <> sit for use at home to decrease irritation. Continued to reinforce how to use this at home.   Pt required multimodal cuing for proper technique and to facilitate improved neuromuscular control, strength, range of motion, and functional ability resulting in improved performance and form.  PATIENT EDUCATION:  Education details: Dentist. Education on diagnosis, prognosis, POC, anatomy and physiology of current condition.  Person educated: Patient Education method: Explanation Education comprehension: verbalized understanding and needs further education  HOME EXERCISE PROGRAM: Access Code: [email or web address] URL: [email or web address] Date: 09/07/2023 Prepared by: Camie Cleverly  Exercises - Supine Pelvic Tilt  - 1 x daily - 1 sets - 20 reps - Supine Diaphragmatic Breathing  - 2 x daily - 2 sets - 10 breaths - Sitting to Supine Roll  - Standing Lumbar Traction with Chair   HOME EXERCISE PROGRAM [[id number]]  Self Lumbar Traction  -   Self-Lumbar Traction  -  Repeat 10 Repetitions, Hold 10 Seconds, Complete 1 Set, Perform 2 Times a Day  ASSESSMENT:  CLINICAL IMPRESSION: Pateint with less pain today and able to tolerate neurodynamic testing. He still had dfficulty relaxing his L leg but this improved with time and gentle attempts. He appeared to feel better with lumbar traction and had improved pain and gait pattern by end of session. E demonstrated positive crossed  leg raise for left low back pain and positive SLR and slump in extension and flexion on the left. Also had neural tension on the right in etended and flexed slump but less so than left. Plan to conitnue reinforcement of modifications to avoid irritating load tolerance and neural tension tolerance and also interventions to help decrease guarding and tension that is likely contributing to these problems. Patient would benefit from continued management of  limiting condition by skilled physical therapist to address remaining impairments and functional limitations to work towards stated goals and return to PLOF or maximal functional independence.   From initial PT exam on 09/05/2023: Patient is a 58 y.o. male referred to outpatient physical therapy with a medical diagnosis of chronic midline low back pain with left-sided sciatica who presents with signs and symptoms consistent with chronic low back pain with L sided radiculopathy. Patient very irritable today so physical exam limited to his tolerance. He demonstrated decreased myotomes at the L foot (ankle DF, PF, eversion, and great toe extension), decreased achilles DTR (S1), and lower leg/foot sensation on left compared to right. Unable to tolerate nerve tension testing today, but pt has increased pain with knee flexion and is unable to relax quad. He had increased tension muscles of thoracic spine and low back, L lumbar paraspinals and QL the worst. He had decreased mobility to CPA attempt in thoracic spine and was unable to tolerate pressure to assess that in lumbar spine. He had allodynia to light touch over thoracic spine and left lumbar spine. He demonstrated avoidance of weight bearing on L LE which significantly limits his mobility and increases risk for falls. He likely has load and nerve tension sensitivity possibly with multidirectional position sensitivity. Patient presents with significant pain, ROM, nerve tension, joint stiffness, gait, balance, muscle  tension, sensation, motor control, knowledge, muscle performance (strength/power/endurance), DTR, myotome, and activity tolerance impairments that are limiting ability to complete his usual activities such as sleeping, rising, walking, ADLs, IADLs, stairs, standing, homemaking, social life, traveling, personal care, lifting, bending, and sitting without difficulty. Patient will benefit from skilled physical therapy intervention to address current body structure impairments and activity limitations to improve function and work towards goals set in current POC in order to return to prior level of function or maximal functional improvement.    OBJECTIVE IMPAIRMENTS: Abnormal gait, cardiopulmonary status limiting activity, decreased activity tolerance, decreased balance, decreased coordination, decreased endurance, decreased knowledge of condition, decreased knowledge of use of DME, decreased mobility, difficulty walking, decreased ROM, decreased strength, hypomobility, increased muscle spasms, impaired flexibility, impaired sensation, impaired tone, improper body mechanics, and pain.   ACTIVITY LIMITATIONS: carrying, lifting, bending, sitting, standing, squatting, sleeping, stairs, transfers, bed mobility, dressing, hygiene/grooming, and locomotion level  PARTICIPATION LIMITATIONS: meal prep, cleaning, laundry, interpersonal relationship, driving, shopping, community activity, and occupation  PERSONAL FACTORS: Fitness, Past/current experiences, Time since onset of injury/illness/exacerbation, and 3+ comorbidities:  Hypertension; Asthma, allergic, mild persistent, uncomplicated; Major depression, recurrent (HCC); Mixed hyperlipidemia; Chronic midline low back pain with left-sided sciatica; Prediabetes; Nicotine dependence with current use; History of diverticulosis; Paresthesia of right upper extremity; Restrictive lung disease; Mixed restrictive and obstructive lung disease (HCC); Asthma-COPD overlap syndrome  (HCC); 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 he  has a past medical history of Allergy, Asthma, COPD (chronic obstructive pulmonary disease) (HCC), Depression, Gynecomastia, male (07/21/2022), and Hypertension. he  has a past surgical history that includes Hernia repair are also affecting patient's functional outcome.   REHAB POTENTIAL: Good  CLINICAL DECISION MAKING: Evolving/moderate complexity  EVALUATION COMPLEXITY: Moderate   GOALS: Goals reviewed with patient? No  SHORT TERM GOALS: Target date: 09/19/2023  Patient will be independent with initial home exercise program for self-management of symptoms. Baseline: Initial HEP to be provided at visit  2 as appropriate (09/05/23); Goal status: In progress  LONG TERM GOALS: Target date: 11/28/2023  Patient will be independent with a long-term home exercise program for self-management of symptoms.  Baseline: Initial HEP to be provided at visit 2 as appropriate (09/05/23); Goal status: In progress  2.  Patient will demonstrate improved Oswestry Disability Index (ODI) to equal or less than 10% to demonstrate improvement in overall condition and self-reported functional ability.  Baseline: 56% (09/05/23); Goal status: In progress  3.  Patient will demonstrate full lumbar and LE ROM without increased symptoms beyond mild intermittent end range discomfort to improve his ability to perform ADLs and IADLs.  Baseline: severely limited where measured - see objective (09/05/23); Goal status: In progress  4.  Patient will demonstrate B LE strength equal bilaterally or at least 4+/5 to improve his ability to complete ADLs, IADLs, and leisure activities such as daily walking with less difficulty.  Baseline: L ankle/great toe myotomes  3/5 R ankle/great toe myotomes 4+/5 (09/05/23); Goal status: In progress  6.  Patient will report NPRS equal or less than 5/10 during functional activities during the last 2 weeks to improve their abilitly to complete community, work and/or recreational activities with less limitation. Baseline: 8/10 (09/05/23); Goal status: In progress   PLAN:  PT FREQUENCY: 2x/week  PT DURATION: 12 weeks  PLANNED INTERVENTIONS: 97164- PT Re-evaluation, 97750- Physical Performance Testing, 97110-Therapeutic exercises, 97530- Therapeutic activity, 97112- Neuromuscular re-education, 97535- Self Care, 02859- Manual therapy, [phone]- Gait training, [phone]- Aquatic Therapy, [phone]- Electrical stimulation (unattended), [phone]- Traction (mechanical), [phone] (1-2 muscles), 20561 (3+ muscles)- Dry Needling, Patient/Family education, Balance training, Stair training, Joint mobilization, Spinal mobilization, DME instructions, Cryotherapy, and Moist heat.  PLAN FOR NEXT SESSION: update HEP as appropriate, interventions for pain relief and to decrease muscle guarding, further assessment as appropriate, education on mechanical stressors and modifications in daily life, progressing to retraining of motor control, rebuilding of strength and functional ability as tolerance improves. Education. Manual therapy.    Camie SAUNDERS. Juli, PT, DPT, Cert. MDT 09/07/23, 6:56 PM  [facility] [address] [place], [place] [address] P: [phone] I F: [phone]

## 2023-09-12 ENCOUNTER — Ambulatory Visit: Payer: MEDICAID | Admitting: Physical Therapy

## 2023-09-12 ENCOUNTER — Encounter: Payer: Self-pay | Admitting: Physical Therapy

## 2023-09-12 DIAGNOSIS — M5459 Other low back pain: Secondary | ICD-10-CM | POA: Diagnosis not present

## 2023-09-12 DIAGNOSIS — M546 Pain in thoracic spine: Secondary | ICD-10-CM

## 2023-09-12 DIAGNOSIS — M792 Neuralgia and neuritis, unspecified: Secondary | ICD-10-CM

## 2023-09-12 DIAGNOSIS — M79652 Pain in left thigh: Secondary | ICD-10-CM

## 2023-09-12 DIAGNOSIS — R262 Difficulty in walking, not elsewhere classified: Secondary | ICD-10-CM

## 2023-09-12 NOTE — Therapy (Signed)
 OUTPATIENT PHYSICAL THERAPY TREATMENT   Patient Name: Larry Maxwell MRN: [id number] DOB:Oct 13, 1965, 58 y.o., male Today's Date: 09/12/2023  END OF SESSION:  PT End of Session - 09/12/23 2022     Visit Number 3    Number of Visits 17    Date for PT Re-Evaluation 11/28/23    Authorization Type [employer] reporting period from 09/05/2023    Authorization Time Period [employer] [id number] for 8 PT vsts from 8/13-03/05/24    Authorization - Visit Number 2    Authorization - Number of Visits 8    Progress Note Due on Visit 10    PT Start Time 1305    PT Stop Time 1347    PT Time Calculation (min) 42 min    Activity Tolerance Patient limited by pain;Patient tolerated treatment well;Other (comment)   limited by difficulty breathing   Behavior During Therapy [facility] for tasks assessed/performed            Past Medical History:  Diagnosis Date   Allergy    Asthma    COPD (chronic obstructive pulmonary disease) (HCC)    Depression    Gynecomastia, male 07/21/2022   Hypertension    Past Surgical History:  Procedure Laterality Date   HERNIA REPAIR     Patient Active Problem List   Diagnosis Date Noted   Acute leg pain, left 04/24/2023   Eczema 04/24/2023   Blood glucose elevated 02/02/2023   Epigastric discomfort 02/02/2023   History of pancreatitis 02/02/2023   Chronic kidney disease, stage 3a (HCC) 02/02/2023   Insomnia due to medical condition 12/15/2022   Mixed restrictive and obstructive lung disease (HCC) 10/25/2022   Asthma-COPD overlap syndrome (HCC) 10/25/2022   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) 10/25/2022   Restrictive lung disease 08/25/2022   Paresthesia of right upper extremity 08/17/2022   History of diverticulosis 08/09/2022   Nicotine dependence with current use 08/04/2022   Prediabetes 08/02/2022   Hypertension 07/21/2022   Asthma, allergic, mild persistent, uncomplicated 07/21/2022   Major depression, recurrent (HCC)  07/21/2022   Mixed hyperlipidemia 07/21/2022   Chronic midline low back pain with left-sided sciatica 07/21/2022    PCP: Donzella Lauraine SAILOR, DO  REFERRING PROVIDER: Donzella Lauraine SAILOR, DO  REFERRING DIAG: chronic midline low back pain with left-sided sciatica  Rationale for Evaluation and Treatment: Rehabilitation  THERAPY DIAG:  Other low back pain  Pain in thoracic spine  Neuralgia and neuritis  Pain in left thigh  Difficulty in walking, not elsewhere classified  ONSET DATE: chronic but current exacerbation 2-3 months prior to initial PT evaluation.   SUBJECTIVE:  PERTINENT HISTORY:  Patient is a 58 y.o. male who presents to outpatient physical therapy with a referral for medical diagnosis chronic midline low back pain with left-sided sciatica. This patient's chief complaints consist of low back pain and left leg pain, leading to the following functional deficits: difficulty with usual activities such as sleeping, rising, walking, ADLs, IADLs, stairs, standing, homemaking, social life, traveling, personal care, lifting, bending, and sitting. 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 he  has a past medical history of Allergy, Asthma, COPD (chronic obstructive pulmonary disease) (HCC), Depression, Gynecomastia, male (07/21/2022), and Hypertension. he  has a past surgical history that includes Hernia repair.  He just finished treatment for his lung disease. He finished pulmonary rehab back in January. Patient denies hx of cancer, stroke, seizures, heart problems, diabetes, unexplained weight loss, unexplained changes in bowel or bladder problems, unexplained stumbling or dropping things, osteoporosis, and spinal surgery  Exercise history: completed pulmonary rehab in June (did it in 2 months instead of 4 because he went daily).   SUBJECTIVE STATEMENT: Patient states he is feeling good today. He has been doing his HEP and working out his left leg. He went for a half mile walk that felt good. He wants to join the gym at the hospital after PT  To three challenges: Walking up steps:   PAIN: Are you having pain?  NPRS: Current: 0/10  From initial evaluation 09/05/23:  Best: 2/10, Worst: 8/10. Pain location: Central low back and down left leg to knee Pain description: Aches in leg and sharp in back.  Aggravating factors: Laying down at night, going up stairs, bending over the wrong way (that is when he feels the sciatic nerve issue), walking, standing up, pain never goes away.  Easing factors: pillow at back when sitting, elevating legs in chair, being still, hot/cold compress, hot shower, medications (but he thinks he is overdosing himself on the medications, so he thinks his body has gotten used to it and it is not helping any more, he does not take narcotics).   PRECAUTIONS: don't be out in the heat. Avoid extreme cold weather. Has stage 4 COPD and restrictive disease.  PATIENT GOALS: I want to be able to do regular things, walk out and lift things He does not even lift his 13 lb cat He has to get someone to take  cases of water up to the apartment. He would like to make it through all of the sessions and make it to a good outcome.    OBJECTIVE  LUMBAR AROM Flexion: fingers to lower tibias, pulling in low back and left back of leg.  Extension: minimal movement with pain down leg Sidedbending:  R 75% left 10% with a lot of increased pain left low back and down leg Rotation: R 50% L 50%   TREATMENT        Therapeutic activities: dynamic therapeutic activities incorporating MULTIPLE parameters or areas of the body designed to achieve improved functional  performance.  Stairs: step to gait leading up with right and down with right. At least 1 hand on handrails. He was able to ascend and descend leading with L LE with mild increase in pain.   UE flexion biased offloaded squats in Squat cage with B UE supporting weight holding on above 1x5 painful 1x10 readjusting position (less painful but still painful) Discontinued for now.   Therapeutic exercise: therapeutic exercises that incorporate ONE parameter at one or more areas of the body to centralize symptoms, develop strength and endurance, range of motion, and flexibility required for successful completion of functional activities.  Lumbar AROM testing (see above)  Neuromuscular Re-education: a technique or exercise performed with the goal of improving the level of communication between the body and the brain, such as for balance, motor control, muscle activation patterns, coordination, desensitization, quality of muscle contraction, proprioception, and/or kinesthetic sense needed for successful and safe completion of functional activities.   Review of hooklying pelvic tilt  Review of hooklying diaphragmatic breathing  Hooklying TrA contraction with self palpation paired with breathing 1x20  Flicker at first with PT palpation Practicing more with breathing.   Quadruped pelvic tilt AROM with goal of finding pain free range and isolating needed  muscles while relaxing compensatory and unneccessary muscle use Difficulty controlling  Prone with 2 pillows under abdomen: PPT with alternating hip extension A few reps each side, but was holding breath during each rep and had to move out of the position with increased work of breathing, feeling overheated and anxious.   Pt required multimodal cuing for proper technique and to facilitate improved neuromuscular control, strength, range of motion, and functional ability resulting in improved performance and form.  PATIENT EDUCATION:  Education details: Dentist. Education on diagnosis, prognosis, POC, anatomy and physiology of current condition.  Person educated: Patient Education method: Explanation Education comprehension: verbalized understanding and needs further education  HOME EXERCISE PROGRAM: Access Code: [email or web address] URL: [email or web address] Date: 09/07/2023 Prepared by: Camie Cleverly  Exercises - Supine Pelvic Tilt  - 1 x daily - 1 sets - 20 reps - Supine Diaphragmatic Breathing  - 2 x daily - 2 sets - 10 breaths - Sitting to Supine Roll  - Standing Lumbar Traction with Chair   HOME EXERCISE PROGRAM [[id number]]  Self Lumbar Traction  -   Self-Lumbar Traction  -  Repeat 10 Repetitions, Hold 10 Seconds, Complete 1 Set, Perform 2 Times a Day  ASSESSMENT:  CLINICAL IMPRESSION: Pateint arrives generally moving better and reporting feeling a little better since last PT session. Stair assessment showed he usually leads with R LE up and down for comfort (pain with pressure through L LE), however he was able to start using L LE to lead up with minimal increased discomfort. PT suggested he practice this when climbing stairs on days where pain is lower as he seemed very motivated to work on improving his stair climbing form. HE was able to descend leading with left, but PT did not recommend changing this aspect of his stair descent yet because of the  increased impact when coming down. Attempted to progress motor control and bracing today with difficulty due to increasing pain, difficulty with breathing (exacerbation of respiratory condition). Patient had unreliable and uncoordinated TrA contraction and had poor control of hips in quadruped. Therefore moved to prone on pillows but that resulted in patient's respiratory condition being further exacerbated due to pressure from pillows at abdomen and he felt distress with overheating, increased work of  breathing, and intolerance for the position. He was able to calm down and regain more normal breathing and felt no worse by end of session. He plans to bring his inhaler next session. Patient appeared to equate pain with effort/work and was educated that pain is not a good indicator of the value of the exercises he is doing and ideal exercises are effortful but do not increase pain /symptoms. Lumbar ROM tested again today and he seemed to have more of an extension sensitivity than other motions (at least in loaded position). In prone he felt more symptoms with PPT. Patient would benefit from continued management of limiting condition by skilled physical therapist to address remaining impairments and functional limitations to work towards stated goals and return to PLOF or maximal functional independence.    From initial PT exam on 09/05/2023: Patient is a 58 y.o. male referred to outpatient physical therapy with a medical diagnosis of chronic midline low back pain with left-sided sciatica who presents with signs and symptoms consistent with chronic low back pain with L sided radiculopathy. Patient very irritable today so physical exam limited to his tolerance. He demonstrated decreased myotomes at the L foot (ankle DF, PF, eversion, and great toe extension), decreased achilles DTR (S1), and lower leg/foot sensation on left compared to right. Unable to tolerate nerve tension testing today, but pt has increased pain  with knee flexion and is unable to relax quad. He had increased tension muscles of thoracic spine and low back, L lumbar paraspinals and QL the worst. He had decreased mobility to CPA attempt in thoracic spine and was unable to tolerate pressure to assess that in lumbar spine. He had allodynia to light touch over thoracic spine and left lumbar spine. He demonstrated avoidance of weight bearing on L LE which significantly limits his mobility and increases risk for falls. He likely has load and nerve tension sensitivity possibly with multidirectional position sensitivity. Patient presents with significant pain, ROM, nerve tension, joint stiffness, gait, balance, muscle tension, sensation, motor control, knowledge, muscle performance (strength/power/endurance), DTR, myotome, and activity tolerance impairments that are limiting ability to complete his usual activities such as sleeping, rising, walking, ADLs, IADLs, stairs, standing, homemaking, social life, traveling, personal care, lifting, bending, and sitting without difficulty. Patient will benefit from skilled physical therapy intervention to address current body structure impairments and activity limitations to improve function and work towards goals set in current POC in order to return to prior level of function or maximal functional improvement.    OBJECTIVE IMPAIRMENTS: Abnormal gait, cardiopulmonary status limiting activity, decreased activity tolerance, decreased balance, decreased coordination, decreased endurance, decreased knowledge of condition, decreased knowledge of use of DME, decreased mobility, difficulty walking, decreased ROM, decreased strength, hypomobility, increased muscle spasms, impaired flexibility, impaired sensation, impaired tone, improper body mechanics, and pain.   ACTIVITY LIMITATIONS: carrying, lifting, bending, sitting, standing, squatting, sleeping, stairs, transfers, bed mobility, dressing, hygiene/grooming, and locomotion  level  PARTICIPATION LIMITATIONS: meal prep, cleaning, laundry, interpersonal relationship, driving, shopping, community activity, and occupation  PERSONAL FACTORS: Fitness, Past/current experiences, Time since onset of injury/illness/exacerbation, and 3+ comorbidities:  Hypertension; Asthma, allergic, mild persistent, uncomplicated; Major depression, recurrent (HCC); Mixed hyperlipidemia; Chronic midline low back pain with left-sided sciatica; Prediabetes; Nicotine dependence with current use; History of diverticulosis; Paresthesia of right upper extremity; Restrictive lung disease; Mixed restrictive and obstructive lung disease (HCC); Asthma-COPD overlap syndrome (HCC); 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 he  has a past medical history of Allergy, Asthma, COPD (chronic obstructive pulmonary disease) (HCC), Depression, Gynecomastia, male (07/21/2022), and Hypertension. he  has a past surgical history that includes Hernia repair are also affecting patient's functional outcome.   REHAB POTENTIAL: Good  CLINICAL DECISION MAKING: Evolving/moderate complexity  EVALUATION COMPLEXITY: Moderate   GOALS: Goals reviewed with patient? No  SHORT TERM GOALS: Target date: 09/19/2023  Patient will be independent with initial home exercise program for self-management of symptoms. Baseline: Initial HEP to be provided at visit 2 as appropriate (09/05/23); Goal status: In progress  LONG TERM GOALS: Target date: 11/28/2023  Patient will be independent with a long-term home exercise program for self-management of symptoms.  Baseline: Initial HEP to be provided at visit 2 as appropriate (09/05/23); Goal status: In progress  2.  Patient will demonstrate improved Oswestry Disability Index (ODI)  to equal or less than 10% to demonstrate improvement in overall condition and self-reported functional ability.  Baseline: 56% (09/05/23); Goal status: In progress  3.  Patient will demonstrate full lumbar and LE ROM without increased symptoms beyond mild intermittent end range discomfort to improve his ability to perform ADLs and IADLs.  Baseline: severely limited where measured - see objective (09/05/23); Goal status: In progress  4.  Patient will demonstrate B LE strength equal bilaterally or at least 4+/5 to improve his ability to complete ADLs, IADLs, and leisure activities such as daily walking with less difficulty.  Baseline: L ankle/great toe myotomes 3/5 R ankle/great toe myotomes 4+/5 (09/05/23); Goal status: In progress  6.  Patient will report NPRS equal or less than 5/10 during functional activities during the last 2 weeks to improve their abilitly to complete community, work and/or recreational activities with less limitation. Baseline: 8/10 (09/05/23); Goal status: In progress   PLAN:  PT FREQUENCY: 2x/week  PT DURATION: 12 weeks  PLANNED INTERVENTIONS: 97164- PT Re-evaluation, 97750- Physical Performance Testing, 97110-Therapeutic exercises, 97530- Therapeutic activity, 97112- Neuromuscular re-education, 97535- Self Care, [id number]- Manual therapy, [phone]- Gait training, [phone]- Aquatic Therapy, [phone]- Electrical stimulation (unattended), [phone]- Traction (mechanical), [phone] (1-2 muscles), 20561 (3+ muscles)- Dry Needling, Patient/Family education, Balance training, Stair training, Joint mobilization, Spinal mobilization, DME instructions, Cryotherapy, and Moist heat.  PLAN FOR NEXT SESSION: update HEP as appropriate, interventions for pain relief and to decrease muscle guarding, further assessment as appropriate, education on mechanical stressors and modifications in daily life, progressing to retraining of motor control, rebuilding of strength and functional ability as tolerance  improves. Education. Manual therapy.    Camie SAUNDERS. Juli, PT, DPT, Cert. MDT 09/12/23, 8:33 PM  [facility] [address] [place], [place] [address] P: [phone] I F: [phone]

## 2023-09-14 ENCOUNTER — Encounter: Payer: Self-pay | Admitting: Physical Therapy

## 2023-09-14 ENCOUNTER — Ambulatory Visit: Payer: MEDICAID | Admitting: Physical Therapy

## 2023-09-14 DIAGNOSIS — M546 Pain in thoracic spine: Secondary | ICD-10-CM

## 2023-09-14 DIAGNOSIS — M792 Neuralgia and neuritis, unspecified: Secondary | ICD-10-CM

## 2023-09-14 DIAGNOSIS — M5459 Other low back pain: Secondary | ICD-10-CM | POA: Diagnosis not present

## 2023-09-14 DIAGNOSIS — M79652 Pain in left thigh: Secondary | ICD-10-CM

## 2023-09-14 DIAGNOSIS — R262 Difficulty in walking, not elsewhere classified: Secondary | ICD-10-CM

## 2023-09-14 NOTE — Therapy (Signed)
 OUTPATIENT PHYSICAL THERAPY TREATMENT   Patient Name: Larry Maxwell MRN: [id number] DOB:July 04, 1965, 58 y.o., male Today's Date: 09/14/2023  END OF SESSION:  PT End of Session - 09/14/23 1328     Visit Number 4    Number of Visits 17    Date for PT Re-Evaluation 11/28/23    Authorization Type [employer] reporting period from 09/05/2023    Authorization Time Period [employer] [id number] for 8 PT vsts from 8/13-03/05/24    Authorization - Visit Number 3    Authorization - Number of Visits 8    Progress Note Due on Visit 10    PT Start Time 1301    PT Stop Time 1340    PT Time Calculation (min) 39 min    Activity Tolerance Patient limited by pain;Patient tolerated treatment well    Behavior During Therapy [facility] for tasks assessed/performed             Past Medical History:  Diagnosis Date   Allergy    Asthma    COPD (chronic obstructive pulmonary disease) (HCC)    Depression    Gynecomastia, male 07/21/2022   Hypertension    Past Surgical History:  Procedure Laterality Date   HERNIA REPAIR     Patient Active Problem List   Diagnosis Date Noted   Acute leg pain, left 04/24/2023   Eczema 04/24/2023   Blood glucose elevated 02/02/2023   Epigastric discomfort 02/02/2023   History of pancreatitis 02/02/2023   Chronic kidney disease, stage 3a (HCC) 02/02/2023   Insomnia due to medical condition 12/15/2022   Mixed restrictive and obstructive lung disease (HCC) 10/25/2022   Asthma-COPD overlap syndrome (HCC) 10/25/2022   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) 10/25/2022   Restrictive lung disease 08/25/2022   Paresthesia of right upper extremity 08/17/2022   History of diverticulosis 08/09/2022   Nicotine dependence with current use 08/04/2022   Prediabetes 08/02/2022   Hypertension 07/21/2022   Asthma, allergic, mild persistent, uncomplicated 07/21/2022   Major depression, recurrent (HCC) 07/21/2022   Mixed hyperlipidemia 07/21/2022    Chronic midline low back pain with left-sided sciatica 07/21/2022    PCP: Donzella Lauraine SAILOR, DO  REFERRING PROVIDER: Donzella Lauraine SAILOR, DO  REFERRING DIAG: chronic midline low back pain with left-sided sciatica  Rationale for Evaluation and Treatment: Rehabilitation  THERAPY DIAG:  Other low back pain  Pain in thoracic spine  Neuralgia and neuritis  Pain in left thigh  Difficulty in walking, not elsewhere classified  ONSET DATE: chronic but current exacerbation 2-3 months prior to initial PT evaluation.   SUBJECTIVE:  PERTINENT HISTORY:  Patient is a 58 y.o. male who presents to outpatient physical therapy with a referral for medical diagnosis chronic midline low back pain with left-sided sciatica. This patient's chief complaints consist of low back pain and left leg pain, leading to the following functional deficits: difficulty with usual activities such as sleeping, rising, walking, ADLs, IADLs, stairs, standing, homemaking, social life, traveling, personal care, lifting, bending, and sitting. 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 he  has a past medical history of Allergy, Asthma, COPD (chronic obstructive pulmonary disease) (HCC), Depression, Gynecomastia, male (07/21/2022), and Hypertension. he  has a past surgical history that includes Hernia repair.  He just finished treatment for his lung disease. He finished pulmonary rehab back in January. Patient denies hx of cancer, stroke, seizures, heart problems, diabetes, unexplained weight loss, unexplained changes in bowel or bladder problems, unexplained stumbling or dropping things, osteoporosis, and spinal surgery  Exercise history: completed pulmonary rehab in June (did it in 2 months instead of 4 because he went daily).   SUBJECTIVE STATEMENT: Patient states the back of his leg is hurting today. He went to sleep after last PT session and did not get up for the rest of the day.   Top three challenges: Walking up steps:   PAIN: Are you having pain?  NPRS: Current: 7/10  From initial evaluation 09/05/23:  Best: 2/10, Worst: 8/10. Pain location: Central low back and down left leg to knee Pain description: Aches in leg and sharp in back.  Aggravating factors: Laying down at night, going up stairs, bending over the wrong way (that is when he feels the sciatic nerve issue), walking, standing up, pain never goes away.  Easing factors: pillow at back when sitting, elevating legs in chair, being still, hot/cold compress, hot shower, medications (but he thinks he is overdosing himself on the medications, so he thinks his body has gotten used to it and it is not helping any more, he does not take narcotics).   PRECAUTIONS: don't be out in the heat. Avoid extreme cold weather. Has stage 4 COPD and restrictive disease.  PATIENT GOALS: I want to be able to do regular things, walk out and lift things He does not even lift his 13 lb cat He has to get someone to take cases of water up to the apartment. He would like to make it through all of the sessions and make it to a  good outcome.    OBJECTIVE  TREATMENT         Manual therapy: to reduce pain and tissue tension, improve range of motion, neuromodulation, in order to promote improved ability to complete functional activities. Hooklying manual lumbar traction 10 seconds on/off for 5 minutes  Right sidelying STM to L QL and L > R lumbar paraspinals  Neuromuscular Re-education: a technique or exercise performed with the goal of improving the level of communication between the body and the brain, such as for balance, motor control, muscle  activation patterns, coordination, desensitization, quality of muscle contraction, proprioception, and/or kinesthetic sense needed for successful and safe completion of functional activities.   Lock Clams  2x15 each side to isolate hip ER without compensatory roll and force deep hip rotators and glutes to work Added to [employer] pelvic tilt AROM with goal of finding pain free range and isolating needed muscles while relaxing compensatory and unneccessary muscle use 1x20 after practice  Hooklying posterior pelvic tilt hold with shoulder flexion Many reps to learn technique with AROM then  2x15 with pvc stick loaded with 4#AW  PT with tactile cuing under lower ribs to help patient find motor control. Significantly improved with practice.  Pateint challenged by the coordination of maintaining back position while breathing and moving arms.  Added to HEP  Pt required multimodal cuing for proper technique and to facilitate improved neuromuscular control, strength, range of motion, and functional ability resulting in improved performance and form.  PATIENT EDUCATION:  Education details: Dentist. Education on diagnosis, prognosis, POC, anatomy and physiology of current condition.  Person educated: Patient Education method: Explanation Education comprehension: verbalized understanding and needs further education  HOME EXERCISE PROGRAM: Access Code:  [email or web address] URL: [email or web address] Date: 09/14/2023 Prepared by: Camie Cleverly  Exercises - Supine Pelvic Tilt  - 1 x daily - 1 sets - 20 reps - Supine Diaphragmatic Breathing  - 2 x daily - 2 sets - 10 breaths - Sitting to Supine Roll  - Standing Lumbar Traction with Chair  - Supine Shoulder Flexion with Dowel  - 1-2 x daily - 3 sets - 10 reps  HOME EXERCISE PROGRAM [[id number]]  Self Lumbar Traction  -   Self-Lumbar Traction  -  Repeat 10 Repetitions, Hold 10 Seconds, Complete 1 Set, Perform 2 Times a Day  HOME EXERCISE PROGRAM [[id number]]  Lock Clams -  Repeat 15 Repetitions, Complete 2 Sets, Perform 1 Times a Day  ASSESSMENT:  CLINICAL IMPRESSION: Patient arrives with pain more elevated than when he arrived to last PT session. Returned to focus on decreased muscle tension and pain with manual therapy for nervous-system down regulation and lumbar offloading through traction. Patient tolerated this well but continues to be very tight over the L QL and lumbar paraspinals. Avoided prone today due to breathing problems last visit. Patient was able to tolerate lock clams and then worked on progressing lumbopelvic control coordinated with breathing in hooklying using lightly loaded bilateral shoulder flexion successfully. Patient still struggles to take full breaths while performing ADIM, but demonstrated progress in awareness and motor control this session. HEP updated so he can continue working on these tasks at home. Patient would benefit from continued management of limiting condition by skilled physical therapist to address remaining impairments and functional limitations to work towards stated goals and return to PLOF or maximal functional independence.   From initial PT exam on 09/05/2023: Patient is a 58 y.o. male referred to outpatient physical therapy with a medical diagnosis of chronic midline low back pain with left-sided sciatica who presents with signs and symptoms  consistent with chronic low back pain with L sided radiculopathy. Patient very irritable today so physical exam limited to his tolerance. He demonstrated decreased myotomes at the L foot (ankle DF, PF, eversion, and great toe extension), decreased achilles DTR (S1), and lower leg/foot sensation on left compared to right. Unable to tolerate nerve tension testing today, but pt has increased pain with knee flexion and is unable to relax quad. He had increased tension  muscles of thoracic spine and low back, L lumbar paraspinals and QL the worst. He had decreased mobility to CPA attempt in thoracic spine and was unable to tolerate pressure to assess that in lumbar spine. He had allodynia to light touch over thoracic spine and left lumbar spine. He demonstrated avoidance of weight bearing on L LE which significantly limits his mobility and increases risk for falls. He likely has load and nerve tension sensitivity possibly with multidirectional position sensitivity. Patient presents with significant pain, ROM, nerve tension, joint stiffness, gait, balance, muscle tension, sensation, motor control, knowledge, muscle performance (strength/power/endurance), DTR, myotome, and activity tolerance impairments that are limiting ability to complete his usual activities such as sleeping, rising, walking, ADLs, IADLs, stairs, standing, homemaking, social life, traveling, personal care, lifting, bending, and sitting without difficulty. Patient will benefit from skilled physical therapy intervention to address current body structure impairments and activity limitations to improve function and work towards goals set in current POC in order to return to prior level of function or maximal functional improvement.    OBJECTIVE IMPAIRMENTS: Abnormal gait, cardiopulmonary status limiting activity, decreased activity tolerance, decreased balance, decreased coordination, decreased endurance, decreased knowledge of condition, decreased  knowledge of use of DME, decreased mobility, difficulty walking, decreased ROM, decreased strength, hypomobility, increased muscle spasms, impaired flexibility, impaired sensation, impaired tone, improper body mechanics, and pain.   ACTIVITY LIMITATIONS: carrying, lifting, bending, sitting, standing, squatting, sleeping, stairs, transfers, bed mobility, dressing, hygiene/grooming, and locomotion level  PARTICIPATION LIMITATIONS: meal prep, cleaning, laundry, interpersonal relationship, driving, shopping, community activity, and occupation  PERSONAL FACTORS: Fitness, Past/current experiences, Time since onset of injury/illness/exacerbation, and 3+ comorbidities:  Hypertension; Asthma, allergic, mild persistent, uncomplicated; Major depression, recurrent (HCC); Mixed hyperlipidemia; Chronic midline low back pain with left-sided sciatica; Prediabetes; Nicotine dependence with current use; History of diverticulosis; Paresthesia of right upper extremity; Restrictive lung disease; Mixed restrictive and obstructive lung disease (HCC); Asthma-COPD overlap syndrome (HCC); 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 he  has a past medical history of Allergy, Asthma, COPD (chronic obstructive pulmonary disease) (HCC), Depression, Gynecomastia, male (07/21/2022), and Hypertension. he  has a past surgical history that includes Hernia repair are also affecting patient's functional outcome.   REHAB POTENTIAL: Good  CLINICAL DECISION MAKING: Evolving/moderate complexity  EVALUATION COMPLEXITY: Moderate   GOALS: Goals reviewed with patient? No  SHORT TERM GOALS: Target date: 09/19/2023  Patient will be independent with initial home exercise program for self-management of symptoms. Baseline: Initial HEP  to be provided at visit 2 as appropriate (09/05/23); Goal status: In progress  LONG TERM GOALS: Target date: 11/28/2023  Patient will be independent with a long-term home exercise program for self-management of symptoms.  Baseline: Initial HEP to be provided at visit 2 as appropriate (09/05/23); Goal status: In progress  2.  Patient will demonstrate improved Oswestry Disability Index (ODI) to equal or less than 10% to demonstrate improvement in overall condition and self-reported functional ability.  Baseline: 56% (09/05/23); Goal status: In progress  3.  Patient will demonstrate full lumbar and LE ROM without increased symptoms beyond mild intermittent end range discomfort to improve his ability to perform ADLs and IADLs.  Baseline: severely limited where measured - see objective (09/05/23); Goal status: In progress  4.  Patient will demonstrate B LE strength equal bilaterally or at least  4+/5 to improve his ability to complete ADLs, IADLs, and leisure activities such as daily walking with less difficulty.  Baseline: L ankle/great toe myotomes 3/5 R ankle/great toe myotomes 4+/5 (09/05/23); Goal status: In progress  6.  Patient will report NPRS equal or less than 5/10 during functional activities during the last 2 weeks to improve their abilitly to complete community, work and/or recreational activities with less limitation. Baseline: 8/10 (09/05/23); Goal status: In progress   PLAN:  PT FREQUENCY: 2x/week  PT DURATION: 12 weeks  PLANNED INTERVENTIONS: 97164- PT Re-evaluation, 97750- Physical Performance Testing, 97110-Therapeutic exercises, 97530- Therapeutic activity, 97112- Neuromuscular re-education, 97535- Self Care, 02859- Manual therapy, [phone]- Gait training, [phone]- Aquatic Therapy, [phone]- Electrical stimulation (unattended), [phone]- Traction (mechanical), [phone] (1-2 muscles), [phone] (3+ muscles)- Dry Needling, Patient/Family education, Balance training, Stair training, Joint  mobilization, Spinal mobilization, DME instructions, Cryotherapy, and Moist heat.  PLAN FOR NEXT SESSION: update HEP as appropriate, interventions for pain relief and to decrease muscle guarding, further assessment as appropriate, education on mechanical stressors and modifications in daily life, progressing to retraining of motor control, rebuilding of strength and functional ability as tolerance improves. Education. Manual therapy.    Camie SAUNDERS. Juli, PT, DPT, Cert. MDT 09/14/23, 3:12 PM  [facility] [address] [place], [place] [address] P: [phone] I F: [phone]

## 2023-09-19 ENCOUNTER — Encounter: Payer: Self-pay | Admitting: Physical Therapy

## 2023-09-19 ENCOUNTER — Ambulatory Visit: Payer: MEDICAID | Admitting: Physical Therapy

## 2023-09-19 DIAGNOSIS — R262 Difficulty in walking, not elsewhere classified: Secondary | ICD-10-CM

## 2023-09-19 DIAGNOSIS — M546 Pain in thoracic spine: Secondary | ICD-10-CM

## 2023-09-19 DIAGNOSIS — M5459 Other low back pain: Secondary | ICD-10-CM

## 2023-09-19 DIAGNOSIS — M792 Neuralgia and neuritis, unspecified: Secondary | ICD-10-CM

## 2023-09-19 DIAGNOSIS — M79652 Pain in left thigh: Secondary | ICD-10-CM

## 2023-09-19 NOTE — Therapy (Signed)
 OUTPATIENT PHYSICAL THERAPY TREATMENT   Patient Name: Larry Maxwell MRN: [id number] DOB:08-Aug-1965, 58 y.o., male Today's Date: 09/19/2023  END OF SESSION:  PT End of Session - 09/19/23 1309     Visit Number 5    Number of Visits 17    Date for PT Re-Evaluation 11/28/23    Authorization Type [employer] reporting period from 09/05/2023    Authorization Time Period [employer] [id number] for 8 PT vsts from 8/13-03/05/24    Authorization - Visit Number 4    Authorization - Number of Visits 8    Progress Note Due on Visit 10    PT Start Time 1307    PT Stop Time 1345    PT Time Calculation (min) 38 min    Activity Tolerance Patient limited by pain;Patient tolerated treatment well    Behavior During Therapy [facility] for tasks assessed/performed              Past Medical History:  Diagnosis Date   Allergy    Asthma    COPD (chronic obstructive pulmonary disease) (HCC)    Depression    Gynecomastia, male 07/21/2022   Hypertension    Past Surgical History:  Procedure Laterality Date   HERNIA REPAIR     Patient Active Problem List   Diagnosis Date Noted   Acute leg pain, left 04/24/2023   Eczema 04/24/2023   Blood glucose elevated 02/02/2023   Epigastric discomfort 02/02/2023   History of pancreatitis 02/02/2023   Chronic kidney disease, stage 3a (HCC) 02/02/2023   Insomnia due to medical condition 12/15/2022   Mixed restrictive and obstructive lung disease (HCC) 10/25/2022   Asthma-COPD overlap syndrome (HCC) 10/25/2022   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) 10/25/2022   Restrictive lung disease 08/25/2022   Paresthesia of right upper extremity 08/17/2022   History of diverticulosis 08/09/2022   Nicotine dependence with current use 08/04/2022   Prediabetes 08/02/2022   Hypertension 07/21/2022   Asthma, allergic, mild persistent, uncomplicated 07/21/2022   Major depression, recurrent (HCC) 07/21/2022   Mixed hyperlipidemia 07/21/2022    Chronic midline low back pain with left-sided sciatica 07/21/2022    PCP: Donzella Lauraine SAILOR, DO  REFERRING PROVIDER: Donzella Lauraine SAILOR, DO  REFERRING DIAG: chronic midline low back pain with left-sided sciatica  Rationale for Evaluation and Treatment: Rehabilitation  THERAPY DIAG:  Other low back pain  Pain in thoracic spine  Neuralgia and neuritis  Pain in left thigh  Difficulty in walking, not elsewhere classified  ONSET DATE: chronic but current exacerbation 2-3 months prior to initial PT evaluation.   SUBJECTIVE:  PERTINENT HISTORY:  Patient is a 58 y.o. male who presents to outpatient physical therapy with a referral for medical diagnosis chronic midline low back pain with left-sided sciatica. This patient's chief complaints consist of low back pain and left leg pain, leading to the following functional deficits: difficulty with usual activities such as sleeping, rising, walking, ADLs, IADLs, stairs, standing, homemaking, social life, traveling, personal care, lifting, bending, and sitting. 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 he  has a past medical history of Allergy, Asthma, COPD (chronic obstructive pulmonary disease) (HCC), Depression, Gynecomastia, male (07/21/2022), and Hypertension. he  has a past surgical history that includes Hernia repair.  He just finished treatment for his lung disease. He finished pulmonary rehab back in January. Patient denies hx of cancer, stroke, seizures, heart problems, diabetes, unexplained weight loss, unexplained changes in bowel or bladder problems, unexplained stumbling or dropping things, osteoporosis, and spinal surgery  Exercise history: completed pulmonary rehab in June (did it in 2 months instead of 4 because he went daily).   SUBJECTIVE STATEMENT: Patient states he is feeling well today. He states he feels better after doing more walking and practicing the steps 5 times this weekend. He states his goal is to going the gym at the hospital after he finishes with PT here. He wants to use the treadmill, the stepper, the bike, and lift weights. He states he does not do lock clams at home because it is too hard for him to get up and down from the floor and his bed is too soft to do them on the bed. He is also not doing the pull over for the same reason.   Top three challenges: Walking up steps:   PAIN: Are you having pain?  NPRS: Current: 5/10 at the back of left leg  From initial evaluation 09/05/23:  Best: 2/10, Worst: 8/10. Pain location: Central low back and down left leg to knee Pain description: Aches in leg and sharp in back.  Aggravating factors: Laying down at night, going up stairs, bending over the wrong way (that is when he feels the sciatic nerve issue), walking, standing up, pain never goes away.  Easing factors: pillow at back when sitting, elevating legs in chair, being still, hot/cold compress, hot shower, medications (but he thinks he is overdosing himself on the medications, so he thinks his body has gotten used to it and it is not helping any  more, he does not take narcotics).   PRECAUTIONS: don't be out in the heat. Avoid extreme cold weather. Has stage 4 COPD and restrictive disease.  PATIENT GOALS: I want to be able to do regular things, walk out and lift things He does not even lift his 13 lb cat He has to get someone to take cases of water up to the apartment. He would like to make it through all of the sessions and make it to a good outcome.    OBJECTIVE  TREATMENT  Manual therapy: to reduce pain and tissue tension, improve range of motion, neuromodulation, in order to promote improved ability to complete functional activities. Hooklying manual lumbar traction 10x 10 seconds on/off (before exercise) 4x10 seconds on/off (after exercise)  Neuromuscular Re-education: a technique or exercise performed with the goal of improving the level of communication between the body and the brain, such as for balance, motor control, muscle activation patterns, coordination, desensitization, quality of muscle contraction, proprioception, and/or kinesthetic sense needed for successful and safe completion of functional activities.   Lock Clams  [phone] with 2 second hold each side to isolate hip ER without compensatory roll and force deep hip rotators and glutes to work Needed cuing to perform correctly.   Hooklying pelvic tilt AROM with goal of finding pain free range and isolating needed muscles while relaxing compensatory and unneccessary muscle use 1x10 after finding pain free range  Hooklying posterior pelvic tilt hold with shoulder flexion Many reps to learn technique with AROM then  1x15 with pvc stick loaded with 7.5#AW  PT with tactile cuing under lower ribs to help patient find motor control.  Pateint challenged by the coordination of maintaining back position while breathing and moving arms.   Quadruped pelvic tilt AROM with goal of finding pain free range and isolating needed muscles while relaxing compensatory and  unneccessary muscle use 1x10 after finding pain free range  Quadruped TrA contraction to learn how to perform 1x5  Quadruped hip drops with one knee on 4 inch yoga block 3x10 each side Child's pose stretch between sets as a resting position Pain at left posterior knee when weight bearing on left  Therapeutic activities: dynamic therapeutic activities incorporating MULTIPLE parameters or areas of the body designed to achieve improved functional performance. Stand <> sidelying and hooklying with support on chair to improve ability to get up and down from the floor for HEP.   Pt required multimodal cuing for proper technique and to facilitate improved neuromuscular control, strength, range of motion, and functional ability resulting in improved performance and form.  PATIENT EDUCATION:  Education details: Dentist. Education on diagnosis, prognosis, POC, anatomy and physiology of current condition.  Person educated: Patient Education method: Explanation Education comprehension: verbalized understanding and needs further education  HOME EXERCISE PROGRAM: Access Code: [email or web address] URL: [email or web address] Date: 09/14/2023 Prepared by: Camie Cleverly  Exercises - Supine Pelvic Tilt  - 1 x daily - 1 sets - 20 reps - Supine Diaphragmatic Breathing  - 2 x daily - 2 sets - 10 breaths - Sitting to Supine Roll  - Standing Lumbar Traction with Chair  - Supine Shoulder Flexion with Dowel  - 1-2 x daily - 3 sets - 10 reps  HOME EXERCISE PROGRAM [[id number]]  Self Lumbar Traction  -   Self-Lumbar Traction  -  Repeat 10 Repetitions, Hold 10 Seconds, Complete 1 Set, Perform 2 Times a Day  HOME EXERCISE PROGRAM [[id number]]  Lock Clams -  Repeat 15 Repetitions, Complete 2 Sets, Perform 1 Times a Day  ASSESSMENT:  CLINICAL IMPRESSION: Patient arrives with pain more controlled than last visit and excited about doing more walking and stairs. Today's session focused on  progression of motor control exercises to quadruped position, pain control with manual therapy, and problem solving with trial of floor <> stand transfer to help patient perform his HEP. Patient had pain with L LE weight bearing in quadruped exercise that did not get better or worse as he completed the exercise. Consider adding quadruped exercises to  HEP in the future. Patient still has difficulty breathing and holding breath when performing abdominal exercises. Patient reports feeling better by end of session. Patient would benefit from continued management of limiting condition by skilled physical therapist to address remaining impairments and functional limitations to work towards stated goals and return to PLOF or maximal functional independence.   From initial PT exam on 09/05/2023: Patient is a 58 y.o. male referred to outpatient physical therapy with a medical diagnosis of chronic midline low back pain with left-sided sciatica who presents with signs and symptoms consistent with chronic low back pain with L sided radiculopathy. Patient very irritable today so physical exam limited to his tolerance. He demonstrated decreased myotomes at the L foot (ankle DF, PF, eversion, and great toe extension), decreased achilles DTR (S1), and lower leg/foot sensation on left compared to right. Unable to tolerate nerve tension testing today, but pt has increased pain with knee flexion and is unable to relax quad. He had increased tension muscles of thoracic spine and low back, L lumbar paraspinals and QL the worst. He had decreased mobility to CPA attempt in thoracic spine and was unable to tolerate pressure to assess that in lumbar spine. He had allodynia to light touch over thoracic spine and left lumbar spine. He demonstrated avoidance of weight bearing on L LE which significantly limits his mobility and increases risk for falls. He likely has load and nerve tension sensitivity possibly with multidirectional position  sensitivity. Patient presents with significant pain, ROM, nerve tension, joint stiffness, gait, balance, muscle tension, sensation, motor control, knowledge, muscle performance (strength/power/endurance), DTR, myotome, and activity tolerance impairments that are limiting ability to complete his usual activities such as sleeping, rising, walking, ADLs, IADLs, stairs, standing, homemaking, social life, traveling, personal care, lifting, bending, and sitting without difficulty. Patient will benefit from skilled physical therapy intervention to address current body structure impairments and activity limitations to improve function and work towards goals set in current POC in order to return to prior level of function or maximal functional improvement.    OBJECTIVE IMPAIRMENTS: Abnormal gait, cardiopulmonary status limiting activity, decreased activity tolerance, decreased balance, decreased coordination, decreased endurance, decreased knowledge of condition, decreased knowledge of use of DME, decreased mobility, difficulty walking, decreased ROM, decreased strength, hypomobility, increased muscle spasms, impaired flexibility, impaired sensation, impaired tone, improper body mechanics, and pain.   ACTIVITY LIMITATIONS: carrying, lifting, bending, sitting, standing, squatting, sleeping, stairs, transfers, bed mobility, dressing, hygiene/grooming, and locomotion level  PARTICIPATION LIMITATIONS: meal prep, cleaning, laundry, interpersonal relationship, driving, shopping, community activity, and occupation  PERSONAL FACTORS: Fitness, Past/current experiences, Time since onset of injury/illness/exacerbation, and 3+ comorbidities:  Hypertension; Asthma, allergic, mild persistent, uncomplicated; Major depression, recurrent (HCC); Mixed hyperlipidemia; Chronic midline low back pain with left-sided sciatica; Prediabetes; Nicotine dependence with current use; History of diverticulosis; Paresthesia of right upper  extremity; Restrictive lung disease; Mixed restrictive and obstructive lung disease (HCC); Asthma-COPD overlap syndrome (HCC); 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 he  has a past medical history of Allergy, Asthma, COPD (chronic obstructive pulmonary disease) (HCC), Depression, Gynecomastia, male (07/21/2022), and Hypertension. he  has a past surgical history that includes Hernia repair are also affecting patient's functional outcome.   REHAB POTENTIAL: Good  CLINICAL DECISION MAKING: Evolving/moderate complexity  EVALUATION COMPLEXITY: Moderate   GOALS: Goals reviewed with  patient? No  SHORT TERM GOALS: Target date: 09/19/2023  Patient will be independent with initial home exercise program for self-management of symptoms. Baseline: Initial HEP to be provided at visit 2 as appropriate (09/05/23); Goal status: In progress  LONG TERM GOALS: Target date: 11/28/2023  Patient will be independent with a long-term home exercise program for self-management of symptoms.  Baseline: Initial HEP to be provided at visit 2 as appropriate (09/05/23); Goal status: In progress  2.  Patient will demonstrate improved Oswestry Disability Index (ODI) to equal or less than 10% to demonstrate improvement in overall condition and self-reported functional ability.  Baseline: 56% (09/05/23); Goal status: In progress  3.  Patient will demonstrate full lumbar and LE ROM without increased symptoms beyond mild intermittent end range discomfort to improve his ability to perform ADLs and IADLs.  Baseline: severely limited where measured - see objective (09/05/23); Goal status: In progress  4.  Patient will demonstrate B LE strength equal bilaterally or at least 4+/5 to improve his ability to complete  ADLs, IADLs, and leisure activities such as daily walking with less difficulty.  Baseline: L ankle/great toe myotomes 3/5 R ankle/great toe myotomes 4+/5 (09/05/23); Goal status: In progress  6.  Patient will report NPRS equal or less than 5/10 during functional activities during the last 2 weeks to improve their abilitly to complete community, work and/or recreational activities with less limitation. Baseline: 8/10 (09/05/23); Goal status: In progress   PLAN:  PT FREQUENCY: 2x/week  PT DURATION: 12 weeks  PLANNED INTERVENTIONS: 97164- PT Re-evaluation, 97750- Physical Performance Testing, 97110-Therapeutic exercises, 97530- Therapeutic activity, 97112- Neuromuscular re-education, 97535- Self Care, [id number]- Manual therapy, [phone]- Gait training, [phone]- Aquatic Therapy, [phone]- Electrical stimulation (unattended), [phone]- Traction (mechanical), [phone] (1-2 muscles), [phone] (3+ muscles)- Dry Needling, Patient/Family education, Balance training, Stair training, Joint mobilization, Spinal mobilization, DME instructions, Cryotherapy, and Moist heat.  PLAN FOR NEXT SESSION: update HEP as appropriate, interventions for pain relief and to decrease muscle guarding, further assessment as appropriate, education on mechanical stressors and modifications in daily life, progressing to retraining of motor control, rebuilding of strength and functional ability as tolerance improves. Education. Manual therapy.    Camie SAUNDERS. Juli, PT, DPT, Cert. MDT 09/19/23, 1:58 PM  [facility] [address] [place], [place] [address] P: [phone] I F: [phone]

## 2023-09-21 ENCOUNTER — Ambulatory Visit: Payer: MEDICAID | Admitting: Physical Therapy

## 2023-09-27 ENCOUNTER — Encounter: Payer: Self-pay | Admitting: Physical Therapy

## 2023-09-27 ENCOUNTER — Ambulatory Visit: Payer: MEDICAID | Attending: Family Medicine | Admitting: Physical Therapy

## 2023-09-27 DIAGNOSIS — M546 Pain in thoracic spine: Secondary | ICD-10-CM | POA: Insufficient documentation

## 2023-09-27 DIAGNOSIS — M79652 Pain in left thigh: Secondary | ICD-10-CM | POA: Diagnosis present

## 2023-09-27 DIAGNOSIS — R262 Difficulty in walking, not elsewhere classified: Secondary | ICD-10-CM | POA: Diagnosis present

## 2023-09-27 DIAGNOSIS — M5459 Other low back pain: Secondary | ICD-10-CM | POA: Diagnosis present

## 2023-09-27 DIAGNOSIS — M792 Neuralgia and neuritis, unspecified: Secondary | ICD-10-CM | POA: Insufficient documentation

## 2023-09-27 NOTE — Therapy (Signed)
 OUTPATIENT PHYSICAL THERAPY TREATMENT   Patient Name: Larry Maxwell MRN: [id number] DOB:[birth year], 58 y.o., male Today's Date: 09/27/2023  END OF SESSION:  PT End of Session - 09/27/23 0923     Visit Number 6    Number of Visits 17    Date for PT Re-Evaluation 11/28/23    Authorization Type [employer] reporting period from 09/05/2023    Authorization Time Period [employer] [id number] for 8 PT vsts from 8/13-03/05/24    Authorization - Visit Number 5    Authorization - Number of Visits 8    Progress Note Due on Visit 10    PT Start Time 0924    PT Stop Time 1014    PT Time Calculation (min) 50 min    Activity Tolerance Patient tolerated treatment well;Patient limited by fatigue    Behavior During Therapy [facility] for tasks assessed/performed               Past Medical History:  Diagnosis Date   Allergy    Asthma    COPD (chronic obstructive pulmonary disease) (HCC)    Depression    Gynecomastia, male 07/21/2022   Hypertension    Past Surgical History:  Procedure Laterality Date   HERNIA REPAIR     Patient Active Problem List   Diagnosis Date Noted   Acute leg pain, left 04/24/2023   Eczema 04/24/2023   Blood glucose elevated 02/02/2023   Epigastric discomfort 02/02/2023   History of pancreatitis 02/02/2023   Chronic kidney disease, stage 3a (HCC) 02/02/2023   Insomnia due to medical condition 12/15/2022   Mixed restrictive and obstructive lung disease (HCC) 10/25/2022   Asthma-COPD overlap syndrome (HCC) 10/25/2022   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) 10/25/2022   Restrictive lung disease 08/25/2022   Paresthesia of right upper extremity 08/17/2022   History of diverticulosis 08/09/2022   Nicotine dependence with current use 08/04/2022   Prediabetes 08/02/2022   Hypertension 07/21/2022   Asthma, allergic, mild persistent, uncomplicated 07/21/2022   Major depression, recurrent (HCC) 07/21/2022   Mixed hyperlipidemia  07/21/2022   Chronic midline low back pain with left-sided sciatica 07/21/2022    PCP: Donzella Lauraine SAILOR, DO  REFERRING PROVIDER: Donzella Lauraine SAILOR, DO  REFERRING DIAG: chronic midline low back pain with left-sided sciatica  Rationale for Evaluation and Treatment: Rehabilitation  THERAPY DIAG:  Other low back pain  Pain in thoracic spine  Neuralgia and neuritis  Pain in left thigh  Difficulty in walking, not elsewhere classified  ONSET DATE: chronic but current exacerbation 2-3 months prior to initial PT evaluation.   SUBJECTIVE:  PERTINENT HISTORY:  Patient is a 58 y.o. male who presents to outpatient physical therapy with a referral for medical diagnosis chronic midline low back pain with left-sided sciatica. This patient's chief complaints consist of low back pain and left leg pain, leading to the following functional deficits: difficulty with usual activities such as sleeping, rising, walking, ADLs, IADLs, stairs, standing, homemaking, social life, traveling, personal care, lifting, bending, and sitting. 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 he  has a past medical history of Allergy, Asthma, COPD (chronic obstructive pulmonary disease) (HCC), Depression, Gynecomastia, male (07/21/2022), and Hypertension. he  has a past surgical history that includes Hernia repair.  He just finished treatment for his lung disease. He finished pulmonary rehab back in January. Patient denies hx of cancer, stroke, seizures, heart problems, diabetes, unexplained weight loss, unexplained changes in bowel or bladder problems, unexplained stumbling or dropping things, osteoporosis, and spinal surgery  Exercise history: completed pulmonary rehab in June (did it in 2 months instead of 4 because he went daily).   SUBJECTIVE STATEMENT: Patient states his back and leg is doing pretty good. He has not been in pain for the last couple of days and is walking pretty good. He has been doing a lot of walking. He still takes his medication.   Top three challenges: Walking up steps:   PAIN: Are you having pain?  NPRS: Current: 0/10 at the back of left leg  From initial evaluation 09/05/23:  Best: 2/10, Worst: 8/10. Pain location: Central low back and down left leg to knee Pain description: Aches in leg and sharp in back.  Aggravating factors: Laying down at night, going up stairs, bending over the wrong way (that is when he feels the sciatic nerve issue), walking, standing up, pain never goes away.  Easing factors: pillow at back when sitting, elevating legs in chair, being still, hot/cold compress, hot shower, medications (but he thinks he is overdosing himself on the medications, so he thinks his body has gotten used to it and it is not helping any more, he does not take narcotics).   PRECAUTIONS: don't be out in the heat. Avoid extreme cold weather. Has stage 4 COPD and restrictive disease.  PATIENT GOALS: I want to be able to do regular things, walk out and lift things He does not even lift his 13 lb cat He has to get someone to take  cases of water up to the apartment. He would like to make it through all of the sessions and make it to a good outcome.    OBJECTIVE  TREATMENT         Manual therapy: to reduce pain and tissue tension, improve range of motion, neuromodulation, in order to promote improved ability to complete functional activities. Hooklying manual lumbar traction 12x 10 seconds on/off (before exercise)  Neuromuscular Re-education: a technique or exercise performed with the goal of improving the level of communication between the body and the brain, such as for  balance, motor control, muscle activation patterns, coordination, desensitization, quality of muscle contraction, proprioception, and/or kinesthetic sense needed for successful and safe completion of functional activities.   Quadruped pelvic tilt AROM with goal of finding pain free range and isolating needed muscles while relaxing compensatory and unneccessary muscle use 1x10 after finding pain free range  Quadruped hip drops with one knee on folded towel 2x10 each side with 5 second hold Child's pose stretch between sets as a resting position  Quadruped alternating shoulder flexion reach for multifidi and lumbopelvic control training. Bringing fist to chest before/after reach. 1x10 each side Added to HEP  Quadruped alternating hip extension for multifidi and lumbopelvic control training. Toe digs in with knee lift, to press to extended knee with toe still digging in, to lift of whole leg, and reverse.  1x5 each side Added to HEP Step by step cuing progressing to independent with carry over  Half kneeling psoas stretch to decrease psoas tension  Goal of at least 1 min each side plus learning motor control how to perform.  Pt with poor counting and holding skills, improved by last rep Added to HEP  Child's Pose stretch between sets of quadruped activities to decrease tension at lumbar paraspinals  Pt required multimodal cuing for proper  technique and to facilitate improved neuromuscular control, strength, range of motion, and functional ability resulting in improved performance and form.  PATIENT EDUCATION:  Education details: Dentist. Education on diagnosis, prognosis, POC, anatomy and physiology of current condition.  Person educated: Patient Education method: Explanation Education comprehension: verbalized understanding and needs further education  HOME EXERCISE PROGRAM: Access Code: [email or web address] URL: [email or web address] Date: 09/27/2023 Prepared by: Camie Cleverly  Exercises - Supine Diaphragmatic Breathing  - 2 x daily - 2 sets - 10 breaths - Sitting to Supine Roll  - Standing Lumbar Traction with Chair  - Supine Shoulder Flexion with Dowel  - 1-2 x daily - 3 sets - 10 reps - Quadruped Alternating Arm Lift  - 1 x daily - 2 sets - 10 reps - 5 seconds hold - Beginner Front Arm Support  - 1 x daily - 1-2 sets - 5 reps - 5 second hold hold - Half Kneeling Hip Flexor Stretch  - 1 x daily - 1 sets - 3 reps - 1 min hold  HOME EXERCISE PROGRAM [[id number]]  Self Lumbar Traction  -   Self-Lumbar Traction  -  Repeat 10 Repetitions, Hold 10 Seconds, Complete 1 Set, Perform 2 Times a Day  HOME EXERCISE PROGRAM [[id number]]  Lock Clams -  Repeat 15 Repetitions, Complete 2 Sets, Perform 1 Times a Day  ASSESSMENT:  CLINICAL IMPRESSION: Patient arrives with well controlled pain and had no increased pain throughout session. Progressed lumbopelvic motor control and multifidi activation exercises with good success. Patient struggles with the difficulty of the exercises and does have some difficulty maintaining breathing, but was appropriately challenged and was able to tolerate well with breaks. Noted improvement in ability to perform exercises more independently by end of session so HEP updated. Patient continues to have impairments and functional limitations and has not yet returned to prior level of  function. Patient would benefit from continued management of limiting condition by skilled physical therapist to address remaining impairments and functional limitations to work towards stated goals and return to PLOF or maximal functional independence.    From initial PT exam on 09/05/2023: Patient is a 58 y.o. male referred to outpatient physical therapy with a medical diagnosis of  chronic midline low back pain with left-sided sciatica who presents with signs and symptoms consistent with chronic low back pain with L sided radiculopathy. Patient very irritable today so physical exam limited to his tolerance. He demonstrated decreased myotomes at the L foot (ankle DF, PF, eversion, and great toe extension), decreased achilles DTR (S1), and lower leg/foot sensation on left compared to right. Unable to tolerate nerve tension testing today, but pt has increased pain with knee flexion and is unable to relax quad. He had increased tension muscles of thoracic spine and low back, L lumbar paraspinals and QL the worst. He had decreased mobility to CPA attempt in thoracic spine and was unable to tolerate pressure to assess that in lumbar spine. He had allodynia to light touch over thoracic spine and left lumbar spine. He demonstrated avoidance of weight bearing on L LE which significantly limits his mobility and increases risk for falls. He likely has load and nerve tension sensitivity possibly with multidirectional position sensitivity. Patient presents with significant pain, ROM, nerve tension, joint stiffness, gait, balance, muscle tension, sensation, motor control, knowledge, muscle performance (strength/power/endurance), DTR, myotome, and activity tolerance impairments that are limiting ability to complete his usual activities such as sleeping, rising, walking, ADLs, IADLs, stairs, standing, homemaking, social life, traveling, personal care, lifting, bending, and sitting without difficulty. Patient will benefit from  skilled physical therapy intervention to address current body structure impairments and activity limitations to improve function and work towards goals set in current POC in order to return to prior level of function or maximal functional improvement.    OBJECTIVE IMPAIRMENTS: Abnormal gait, cardiopulmonary status limiting activity, decreased activity tolerance, decreased balance, decreased coordination, decreased endurance, decreased knowledge of condition, decreased knowledge of use of DME, decreased mobility, difficulty walking, decreased ROM, decreased strength, hypomobility, increased muscle spasms, impaired flexibility, impaired sensation, impaired tone, improper body mechanics, and pain.   ACTIVITY LIMITATIONS: carrying, lifting, bending, sitting, standing, squatting, sleeping, stairs, transfers, bed mobility, dressing, hygiene/grooming, and locomotion level  PARTICIPATION LIMITATIONS: meal prep, cleaning, laundry, interpersonal relationship, driving, shopping, community activity, and occupation  PERSONAL FACTORS: Fitness, Past/current experiences, Time since onset of injury/illness/exacerbation, and 3+ comorbidities:  Hypertension; Asthma, allergic, mild persistent, uncomplicated; Major depression, recurrent (HCC); Mixed hyperlipidemia; Chronic midline low back pain with left-sided sciatica; Prediabetes; Nicotine dependence with current use; History of diverticulosis; Paresthesia of right upper extremity; Restrictive lung disease; Mixed restrictive and obstructive lung disease (HCC); Asthma-COPD overlap syndrome (HCC); 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 he  has a past medical history of Allergy, Asthma, COPD (chronic obstructive pulmonary disease) (HCC), Depression, Gynecomastia,  male (07/21/2022), and Hypertension. he  has a past surgical history that includes Hernia repair are also affecting patient's functional outcome.   REHAB POTENTIAL: Good  CLINICAL DECISION MAKING: Evolving/moderate complexity  EVALUATION COMPLEXITY: Moderate   GOALS: Goals reviewed with patient? No  SHORT TERM GOALS: Target date: 09/19/2023  Patient will be independent with initial home exercise program for self-management of symptoms. Baseline: Initial HEP to be provided at visit 2 as appropriate (09/05/23); Goal status: In progress  LONG TERM GOALS: Target date: 11/28/2023  Patient will be independent with a long-term home exercise program for self-management of symptoms.  Baseline: Initial HEP to be provided at visit 2 as appropriate (09/05/23); Goal status: In progress  2.  Patient will demonstrate improved Oswestry Disability Index (ODI) to equal or less than 10% to demonstrate improvement in overall condition and self-reported functional ability.  Baseline: 56% (09/05/23); Goal status: In progress  3.  Patient will demonstrate full lumbar and LE ROM without increased symptoms beyond mild intermittent end range discomfort to improve his ability to perform ADLs and IADLs.  Baseline: severely limited where measured - see objective (09/05/23); Goal status: In progress  4.  Patient will demonstrate B LE strength equal bilaterally or at least 4+/5 to improve his ability to complete ADLs, IADLs, and leisure activities such as daily walking with less difficulty.  Baseline: L ankle/great toe myotomes 3/5 R ankle/great toe myotomes 4+/5 (09/05/23); Goal status: In progress  6.  Patient will report NPRS equal or less than 5/10 during functional activities during the last 2 weeks to improve their abilitly to complete community, work and/or recreational activities with less limitation. Baseline: 8/10 (09/05/23); Goal status: In progress   PLAN:  PT FREQUENCY: 2x/week  PT DURATION:  12 weeks  PLANNED INTERVENTIONS: 97164- PT Re-evaluation, 97750- Physical Performance Testing, 97110-Therapeutic exercises, 97530- Therapeutic activity, 97112- Neuromuscular re-education, 97535- Self Care, 02859- Manual therapy, [phone]- Gait training, [phone]- Aquatic Therapy, [phone]- Electrical stimulation (unattended), [phone]- Traction (mechanical), [phone] (1-2 muscles), 20561 (3+ muscles)- Dry Needling, Patient/Family education, Balance training, Stair training, Joint mobilization, Spinal mobilization, DME instructions, Cryotherapy, and Moist heat.  PLAN FOR NEXT SESSION: update HEP as appropriate, interventions for pain relief and to decrease muscle guarding, further assessment as appropriate, education on mechanical stressors and modifications in daily life, progressing to retraining of motor control, rebuilding of strength and functional ability as tolerance improves. Education. Manual therapy.    Camie SAUNDERS. Juli, PT, DPT, Cert. MDT 09/27/23, 10:25 AM  [facility] [address] [place], [place] [address] P: [phone] I F: [phone]

## 2023-09-29 ENCOUNTER — Encounter: Payer: Self-pay | Admitting: Physical Therapy

## 2023-09-29 ENCOUNTER — Ambulatory Visit: Payer: MEDICAID | Admitting: Physical Therapy

## 2023-09-29 DIAGNOSIS — M5459 Other low back pain: Secondary | ICD-10-CM

## 2023-09-29 DIAGNOSIS — M79652 Pain in left thigh: Secondary | ICD-10-CM

## 2023-09-29 DIAGNOSIS — M792 Neuralgia and neuritis, unspecified: Secondary | ICD-10-CM

## 2023-09-29 DIAGNOSIS — M546 Pain in thoracic spine: Secondary | ICD-10-CM

## 2023-09-29 DIAGNOSIS — R262 Difficulty in walking, not elsewhere classified: Secondary | ICD-10-CM

## 2023-09-29 NOTE — Therapy (Signed)
 OUTPATIENT PHYSICAL THERAPY TREATMENT   Patient Name: Larry Maxwell MRN: [id number] DOB:15-Aug-1965, 58 y.o., male Today's Date: 09/29/2023  END OF SESSION:  PT End of Session - 09/29/23 1022     Visit Number 7    Number of Visits 17    Date for PT Re-Evaluation 11/28/23    Authorization Type [employer] reporting period from 09/05/2023    Authorization Time Period [place] [id number] for 8 PT vsts from 8/13-03/05/24    Authorization - Visit Number 6    Authorization - Number of Visits 8    Progress Note Due on Visit 10    PT Start Time 1022    PT Stop Time 1106    PT Time Calculation (min) 44 min    Activity Tolerance Patient tolerated treatment well;Patient limited by fatigue    Behavior During Therapy [facility] for tasks assessed/performed                Past Medical History:  Diagnosis Date   Allergy    Asthma    COPD (chronic obstructive pulmonary disease) (HCC)    Depression    Gynecomastia, male 07/21/2022   Hypertension    Past Surgical History:  Procedure Laterality Date   HERNIA REPAIR     Patient Active Problem List   Diagnosis Date Noted   Acute leg pain, left 04/24/2023   Eczema 04/24/2023   Blood glucose elevated 02/02/2023   Epigastric discomfort 02/02/2023   History of pancreatitis 02/02/2023   Chronic kidney disease, stage 3a (HCC) 02/02/2023   Insomnia due to medical condition 12/15/2022   Mixed restrictive and obstructive lung disease (HCC) 10/25/2022   Asthma-COPD overlap syndrome (HCC) 10/25/2022   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) 10/25/2022   Restrictive lung disease 08/25/2022   Paresthesia of right upper extremity 08/17/2022   History of diverticulosis 08/09/2022   Nicotine dependence with current use 08/04/2022   Prediabetes 08/02/2022   Hypertension 07/21/2022   Asthma, allergic, mild persistent, uncomplicated 07/21/2022   Major depression, recurrent (HCC) 07/21/2022   Mixed hyperlipidemia  07/21/2022   Chronic midline low back pain with left-sided sciatica 07/21/2022    PCP: Donzella Lauraine SAILOR, DO  REFERRING PROVIDER: Donzella Lauraine SAILOR, DO  REFERRING DIAG: chronic midline low back pain with left-sided sciatica  Rationale for Evaluation and Treatment: Rehabilitation  THERAPY DIAG:  Other low back pain  Pain in thoracic spine  Neuralgia and neuritis  Pain in left thigh  Difficulty in walking, not elsewhere classified  ONSET DATE: chronic but current exacerbation 2-3 months prior to initial PT evaluation.   SUBJECTIVE:  PERTINENT HISTORY:  Patient is a 58 y.o. male who presents to outpatient physical therapy with a referral for medical diagnosis chronic midline low back pain with left-sided sciatica. This patient's chief complaints consist of low back pain and left leg pain, leading to the following functional deficits: difficulty with usual activities such as sleeping, rising, walking, ADLs, IADLs, stairs, standing, homemaking, social life, traveling, personal care, lifting, bending, and sitting. 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 he  has a past medical history of Allergy, Asthma, COPD (chronic obstructive pulmonary disease) (HCC), Depression, Gynecomastia, male (07/21/2022), and Hypertension. he  has a past surgical history that includes Hernia repair.  He just finished treatment for his lung disease. He finished pulmonary rehab back in January. Patient denies hx of cancer, stroke, seizures, heart problems, diabetes, unexplained weight loss, unexplained changes in bowel or bladder problems, unexplained stumbling or dropping things, osteoporosis, and spinal surgery  Exercise history: completed pulmonary rehab in June (did it in 2 months instead of 4 because he went daily).   SUBJECTIVE STATEMENT: Patient states he is feeling great with no pain. He felt good after last PT session and went for a walk with no pain following. Going up the steps is getting easier but he is still using his arms a lot. He did not do his HEP yesterday because it was a busy day.   Top three challenges: Walking up steps   PAIN: Are you having pain?  NPRS: Current: 0/10 at the back of left leg  From initial evaluation 09/05/23:  Best: 2/10, Worst: 8/10. Pain location: Central low back and down left leg to knee Pain description: Aches in leg and sharp in back.  Aggravating factors: Laying down at night, going up stairs, bending over the wrong way (that is when he feels the sciatic nerve issue), walking, standing up, pain never goes away.  Easing factors: pillow at back when sitting, elevating legs in chair, being still, hot/cold compress, hot shower, medications (but he thinks he is overdosing himself on the medications, so he thinks his body has gotten used to it and it is not helping any more, he does not take narcotics).   PRECAUTIONS: don't be out in the heat. Avoid extreme cold weather. Has stage 4 COPD and restrictive disease.  PATIENT GOALS: I want to be able to do regular things, walk out and lift things He  does not even lift his 13 lb cat He has to get someone to take cases of water up to the apartment. He would like to make it through all of the sessions and make it to a good outcome.    OBJECTIVE  TREATMENT         Manual therapy: to reduce pain and tissue tension, improve range of motion, neuromodulation, in order to promote improved ability to complete functional activities. Hooklying manual lumbar traction 10x 10 seconds on/off (before exercise)  Neuromuscular Re-education: a technique or exercise performed with the goal of  improving the level of communication between the body and the brain, such as for balance, motor control, muscle activation patterns, coordination, desensitization, quality of muscle contraction, proprioception, and/or kinesthetic sense needed for successful and safe completion of functional activities.   Quadruped pelvic tilt AROM with goal of finding pain free range and isolating needed muscles while relaxing compensatory and unneccessary muscle use 1x12 after finding pain free range  Quadruped hip hikes with one knee on folded towel 1x10 each side with 5 second hold  Quadruped alternating shoulder flexion reach for multifidi and lumbopelvic control training. Bringing fist to chest before/after reach. 1x10 each side PT counting reps, patient focused on improving motor control  Quadruped alternating hip extension for multifidi and lumbopelvic control training. Toe digs in with knee lift, to press to extended knee with toe still digging in, to lift of whole leg, and reverse.  1x5 each side Improved carry over, very difficult  Half kneeling psoas stretch to decrease psoas tension  2x1 min each side  Child's Pose stretch between sets of quadruped activities to decrease tension at lumbar paraspinals  Pt required multimodal cuing for proper technique and to facilitate improved neuromuscular control, strength, range of motion, and functional ability resulting in  improved performance and form.  PATIENT EDUCATION:  Education details: Dentist. Education on diagnosis, prognosis, POC, anatomy and physiology of current condition.  Person educated: Patient Education method: Explanation Education comprehension: verbalized understanding and needs further education  HOME EXERCISE PROGRAM: Access Code: [email or web address] URL: [email or web address] Date: 09/29/2023 Prepared by: Camie Cleverly  Exercises - Sitting to Supine Roll  - Standing Lumbar Traction with Chair  - Supine Shoulder Flexion with Dowel  - 1 x daily - 3 sets - 10 reps - Quadruped Hip Hike on Foam  - 1 x daily - 1 sets - 10 reps - 5 seconds hold - Quadruped Alternating Arm Lift  - 1 x daily - 1 sets - 10 reps - 5 seconds hold - Beginner Front Arm Support  - 1 x daily - 1 sets - 5 reps - 5 second hold hold - Half Kneeling Hip Flexor Stretch  - 1 x daily - 1 sets - 3 reps - 1 min hold  HOME EXERCISE PROGRAM [[id number]]  Self Lumbar Traction  -   Self-Lumbar Traction  -  Repeat 10 Repetitions, Hold 10 Seconds, Complete 1 Set, Perform 2 Times a Day  HOME EXERCISE PROGRAM [[id number]]  Lock Clams -  Repeat 15 Repetitions, Complete 2 Sets, Perform 1 Times a Day  ASSESSMENT:  CLINICAL IMPRESSION: Patient continues with well controlled pain and improving function but without participation in HEP since last PT session. Continued with similar exercises to last visit with improving attention and performance of motor control that keeps them challenging. Patient would benefit from continued management of limiting condition by skilled physical therapist to address remaining impairments and functional limitations to work towards stated goals and return to PLOF or maximal functional independence.   From initial PT exam on 09/05/2023: Patient is a 58 y.o. male referred to outpatient physical therapy with a medical diagnosis of chronic midline low back pain with left-sided sciatica who  presents with signs and symptoms consistent with chronic low back pain with L sided radiculopathy. Patient very irritable today so physical exam limited to his tolerance. He demonstrated decreased myotomes at the L foot (ankle DF, PF, eversion, and great toe extension), decreased achilles DTR (S1), and lower leg/foot sensation on left compared to right. Unable to  tolerate nerve tension testing today, but pt has increased pain with knee flexion and is unable to relax quad. He had increased tension muscles of thoracic spine and low back, L lumbar paraspinals and QL the worst. He had decreased mobility to CPA attempt in thoracic spine and was unable to tolerate pressure to assess that in lumbar spine. He had allodynia to light touch over thoracic spine and left lumbar spine. He demonstrated avoidance of weight bearing on L LE which significantly limits his mobility and increases risk for falls. He likely has load and nerve tension sensitivity possibly with multidirectional position sensitivity. Patient presents with significant pain, ROM, nerve tension, joint stiffness, gait, balance, muscle tension, sensation, motor control, knowledge, muscle performance (strength/power/endurance), DTR, myotome, and activity tolerance impairments that are limiting ability to complete his usual activities such as sleeping, rising, walking, ADLs, IADLs, stairs, standing, homemaking, social life, traveling, personal care, lifting, bending, and sitting without difficulty. Patient will benefit from skilled physical therapy intervention to address current body structure impairments and activity limitations to improve function and work towards goals set in current POC in order to return to prior level of function or maximal functional improvement.    OBJECTIVE IMPAIRMENTS: Abnormal gait, cardiopulmonary status limiting activity, decreased activity tolerance, decreased balance, decreased coordination, decreased endurance, decreased  knowledge of condition, decreased knowledge of use of DME, decreased mobility, difficulty walking, decreased ROM, decreased strength, hypomobility, increased muscle spasms, impaired flexibility, impaired sensation, impaired tone, improper body mechanics, and pain.   ACTIVITY LIMITATIONS: carrying, lifting, bending, sitting, standing, squatting, sleeping, stairs, transfers, bed mobility, dressing, hygiene/grooming, and locomotion level  PARTICIPATION LIMITATIONS: meal prep, cleaning, laundry, interpersonal relationship, driving, shopping, community activity, and occupation  PERSONAL FACTORS: Fitness, Past/current experiences, Time since onset of injury/illness/exacerbation, and 3+ comorbidities:  Hypertension; Asthma, allergic, mild persistent, uncomplicated; Major depression, recurrent (HCC); Mixed hyperlipidemia; Chronic midline low back pain with left-sided sciatica; Prediabetes; Nicotine dependence with current use; History of diverticulosis; Paresthesia of right upper extremity; Restrictive lung disease; Mixed restrictive and obstructive lung disease (HCC); Asthma-COPD overlap syndrome (HCC); 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 he  has a past medical history of Allergy, Asthma, COPD (chronic obstructive pulmonary disease) (HCC), Depression, Gynecomastia, male (07/21/2022), and Hypertension. he  has a past surgical history that includes Hernia repair are also affecting patient's functional outcome.   REHAB POTENTIAL: Good  CLINICAL DECISION MAKING: Evolving/moderate complexity  EVALUATION COMPLEXITY: Moderate   GOALS: Goals reviewed with patient? No  SHORT TERM GOALS: Target date: 09/19/2023  Patient will be independent with initial home exercise program for self-management of  symptoms. Baseline: Initial HEP to be provided at visit 2 as appropriate (09/05/23); Goal status: In progress  LONG TERM GOALS: Target date: 11/28/2023  Patient will be independent with a long-term home exercise program for self-management of symptoms.  Baseline: Initial HEP to be provided at visit 2 as appropriate (09/05/23); Goal status: In progress  2.  Patient will demonstrate improved Oswestry Disability Index (ODI) to equal or less than 10% to demonstrate improvement in overall condition and self-reported functional ability.  Baseline: 56% (09/05/23); Goal status: In progress  3.  Patient will demonstrate full lumbar and LE ROM without increased symptoms beyond mild intermittent end range discomfort to improve his ability to perform ADLs and IADLs.  Baseline: severely limited where  measured - see objective (09/05/23); Goal status: In progress  4.  Patient will demonstrate B LE strength equal bilaterally or at least 4+/5 to improve his ability to complete ADLs, IADLs, and leisure activities such as daily walking with less difficulty.  Baseline: L ankle/great toe myotomes 3/5 R ankle/great toe myotomes 4+/5 (09/05/23); Goal status: In progress  6.  Patient will report NPRS equal or less than 5/10 during functional activities during the last 2 weeks to improve their abilitly to complete community, work and/or recreational activities with less limitation. Baseline: 8/10 (09/05/23); Goal status: In progress   PLAN:  PT FREQUENCY: 2x/week  PT DURATION: 12 weeks  PLANNED INTERVENTIONS: 97164- PT Re-evaluation, 97750- Physical Performance Testing, 97110-Therapeutic exercises, 97530- Therapeutic activity, 97112- Neuromuscular re-education, 97535- Self Care, 02859- Manual therapy, [phone]- Gait training, [phone]- Aquatic Therapy, [phone]- Electrical stimulation (unattended), [phone]- Traction (mechanical), [phone] (1-2 muscles), 20561 (3+ muscles)- Dry Needling, Patient/Family education, Balance  training, Stair training, Joint mobilization, Spinal mobilization, DME instructions, Cryotherapy, and Moist heat.  PLAN FOR NEXT SESSION: update HEP as appropriate, interventions for pain relief and to decrease muscle guarding, further assessment as appropriate, education on mechanical stressors and modifications in daily life, progressing to retraining of motor control, rebuilding of strength and functional ability as tolerance improves. Education. Manual therapy.    Camie SAUNDERS. Juli, PT, DPT, Cert. MDT 09/29/23, 11:11 AM  [facility] [address] [place], [place] [address] P: [phone] I F: [phone]

## 2023-09-30 ENCOUNTER — Encounter: Payer: Self-pay | Admitting: Clinical

## 2023-09-30 ENCOUNTER — Ambulatory Visit (INDEPENDENT_AMBULATORY_CARE_PROVIDER_SITE_OTHER): Payer: MEDICAID | Admitting: Clinical

## 2023-09-30 DIAGNOSIS — F325 Major depressive disorder, single episode, in full remission: Secondary | ICD-10-CM

## 2023-09-30 NOTE — Progress Notes (Signed)
   Darice Seats, LCSW

## 2023-09-30 NOTE — Progress Notes (Addendum)
 [facility] Counselor/Therapist Progress Note  Patient ID: Larry Maxwell, MRN: [id number],    Date: 09/30/2023  Time Spent: 8:34am - 9:17am : 43 minutes   Treatment Type: Individual Therapy  Reported Symptoms: none  Mental Status Exam: Appearance:  Neat and Well Groomed     Behavior: Appropriate  Motor: Normal  Speech/Language:  Clear and Coherent and Normal Rate  Affect: Appropriate  Mood: normal  Thought process: normal  Thought content:   WNL  Sensory/Perceptual disturbances:   WNL  Orientation: oriented to person, place, time/date, and situation  Attention: Good  Concentration: Good  Memory: WNL  Fund of knowledge:  Good  Insight:   Good  Judgment:  Good  Impulse Control: Good   Risk Assessment: Danger to Self:  No Patient denied current suicidal ideation  Self-injurious Behavior: No Danger to Others: No Patient denied current homicidal ideation Duty to Warn:no Physical Aggression / Violence:No  Access to Firearms a concern: No  Gang Involvement:No   Subjective: Patient stated, everything's been going pretty good in response to events since last session. Patient stated, things are getting better for me.  Patient stated, as far as my doctors appointments, I'm in physical therapy now and reported attending physical therapy twice weekly due to back pain. Patient stated, I've been doing real good, I can't complain. Patient reported scheduling options, loss of phone, and locating provider's number were barriers to attendance. Patient stated, my mood has been really good in response to mood since last session. Patient stated, everything's been going good and reported spending time with family in response to socialization since last session. Patient stated, I feel good I don't have any issues with being depressed anymore in response to patient's long term goal and reported feeling long term goal has been met. Patient stated, I don't get depressed no  more, I wake up every morning in a good mood in response to first short term goal. Patient stated, I think I'm doing real good on that level and patient reported feeling patient has met each of patient's short term goals. Patient stated, I don't even think about my ex like I use to, I try to keep my mind occupied. Patient stated, I think I'm over all that, I don't let anything worry me anymore in response to fourth short term goal. Patient stated, I communicate real good now in response to patient's fifth short term goal.   Interventions: Cognitive Behavioral Therapy and Motivational Interviewing. Clinician conducted session via caregility video from clinician's home office. Patient provided verbal consent to proceed with telehealth session and is aware of limitations of telephone or video visits. Patient participated in session from patient's home. Discussed patient's absence from therapy and explored/identified barriers to attendance. Reviewed events since last session and assessed for changes. Explored contributing factors to improvements and stability in mood.  Reviewed patient's goals and patient's progress towards goals. Clinician utilized motivational interviewing to explore additional goals for therapy. Clinician utilized a task centered approach in collaboration with patient to update goals for therapy. Patient participated in development/update of goals and agreed to goals for therapy.    Collaboration of Care: Other not required at this time   Diagnosis:  Major depressive disorder, single episode, in full remission     Plan: Patient is to utilize [employer] Therapy, thought re-framing, mindfulness, positive activities, and coping strategies to maintain stability in mood  Frequency: monthly  Modality: individual      Long-term goal:   Maintain stability in  mood 7 days per week per patient's report Target Date: 09/29/24  Progress: established 09/30/23    Short-term  goal:  Identify and implement strategies to prevent relapse in mood Target Date: 07/05/24  Progress: established 09/30/23    Practice coping strategies daily to maintain stability in mood Target Date: 07/05/24  Progress: established 09/30/23    Maintain participation in enjoyable activities 7 days per week Target Date: 07/05/24  Progress: established 09/30/23    patient will verbalize positive statements regarding self and his ability to cope with life stressors Target Date: 07/06/23  Progress: as of 09/30/23 patient reported patient feels this goal has been met    Verbalize patient's thoughts and feelings to through effective communication strategies per patient's report Target Date: 07/06/23  Progress: as of 09/30/23 patient reported patient feels this goal has been met    Identify, challenge, and replace negative thought patterns and negative self talk that contribute to feelings of depression with positive thoughts, beliefs, and positive self talk per patient's report Target Date: 07/06/23  Progress: as of 09/30/23 patient reported patient feels this goal has been met    Darice Seats, LCSW

## 2023-10-02 ENCOUNTER — Other Ambulatory Visit: Payer: Self-pay | Admitting: Family Medicine

## 2023-10-02 DIAGNOSIS — E782 Mixed hyperlipidemia: Secondary | ICD-10-CM

## 2023-10-02 DIAGNOSIS — J449 Chronic obstructive pulmonary disease, unspecified: Secondary | ICD-10-CM

## 2023-10-03 ENCOUNTER — Ambulatory Visit: Payer: MEDICAID | Admitting: Physical Therapy

## 2023-10-03 ENCOUNTER — Other Ambulatory Visit (HOSPITAL_COMMUNITY): Payer: Self-pay

## 2023-10-03 ENCOUNTER — Encounter: Payer: Self-pay | Admitting: Physical Therapy

## 2023-10-03 ENCOUNTER — Other Ambulatory Visit: Payer: Self-pay

## 2023-10-03 DIAGNOSIS — M5459 Other low back pain: Secondary | ICD-10-CM | POA: Diagnosis not present

## 2023-10-03 DIAGNOSIS — M546 Pain in thoracic spine: Secondary | ICD-10-CM

## 2023-10-03 DIAGNOSIS — M79652 Pain in left thigh: Secondary | ICD-10-CM

## 2023-10-03 DIAGNOSIS — R262 Difficulty in walking, not elsewhere classified: Secondary | ICD-10-CM

## 2023-10-03 DIAGNOSIS — M792 Neuralgia and neuritis, unspecified: Secondary | ICD-10-CM

## 2023-10-03 NOTE — Telephone Encounter (Signed)
 LOV -video visit 08/17/23 NOV none on file  LRF 07/10/23 and 07/21/22 LABS 02/17/23

## 2023-10-03 NOTE — Therapy (Signed)
 OUTPATIENT PHYSICAL THERAPY TREATMENT   Patient Name: Larry Maxwell MRN: [id number] DOB:10-30-1965, 58 y.o., male Today's Date: 10/03/2023  END OF SESSION:  PT End of Session - 10/03/23 0947     Visit Number 8    Number of Visits 17    Date for PT Re-Evaluation 11/28/23    Authorization Type [employer] reporting period from 09/05/2023    Authorization Time Period [place] [id number] for 8 PT vsts from 8/13-03/05/24    Authorization - Visit Number 7    Authorization - Number of Visits 8    Progress Note Due on Visit 10    PT Start Time 0947    PT Stop Time 1020    PT Time Calculation (min) 33 min    Activity Tolerance Patient tolerated treatment well;Patient limited by fatigue    Behavior During Therapy [facility] for tasks assessed/performed                 Past Medical History:  Diagnosis Date   Allergy    Asthma    COPD (chronic obstructive pulmonary disease) (HCC)    Depression    Gynecomastia, male 07/21/2022   Hypertension    Past Surgical History:  Procedure Laterality Date   HERNIA REPAIR     Patient Active Problem List   Diagnosis Date Noted   Acute leg pain, left 04/24/2023   Eczema 04/24/2023   Blood glucose elevated 02/02/2023   Epigastric discomfort 02/02/2023   History of pancreatitis 02/02/2023   Chronic kidney disease, stage 3a (HCC) 02/02/2023   Insomnia due to medical condition 12/15/2022   Mixed restrictive and obstructive lung disease (HCC) 10/25/2022   Asthma-COPD overlap syndrome (HCC) 10/25/2022   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) 10/25/2022   Restrictive lung disease 08/25/2022   Paresthesia of right upper extremity 08/17/2022   History of diverticulosis 08/09/2022   Nicotine dependence with current use 08/04/2022   Prediabetes 08/02/2022   Hypertension 07/21/2022   Asthma, allergic, mild persistent, uncomplicated 07/21/2022   Major depression, recurrent (HCC) 07/21/2022   Mixed hyperlipidemia  07/21/2022   Chronic midline low back pain with left-sided sciatica 07/21/2022    PCP: Donzella Lauraine SAILOR, DO  REFERRING PROVIDER: Donzella Lauraine SAILOR, DO  REFERRING DIAG: chronic midline low back pain with left-sided sciatica  Rationale for Evaluation and Treatment: Rehabilitation  THERAPY DIAG:  Other low back pain  Pain in thoracic spine  Neuralgia and neuritis  Pain in left thigh  Difficulty in walking, not elsewhere classified  ONSET DATE: chronic but current exacerbation 2-3 months prior to initial PT evaluation.   SUBJECTIVE:  PERTINENT HISTORY:  Patient is a 58 y.o. male who presents to outpatient physical therapy with a referral for medical diagnosis chronic midline low back pain with left-sided sciatica. This patient's chief complaints consist of low back pain and left leg pain, leading to the following functional deficits: difficulty with usual activities such as sleeping, rising, walking, ADLs, IADLs, stairs, standing, homemaking, social life, traveling, personal care, lifting, bending, and sitting. 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 he  has a past medical history of Allergy, Asthma, COPD (chronic obstructive pulmonary disease) (HCC), Depression, Gynecomastia, male (07/21/2022), and Hypertension. he  has a past surgical history that includes Hernia repair.  He just finished treatment for his lung disease. He finished pulmonary rehab back in January. Patient denies hx of cancer, stroke, seizures, heart problems, diabetes, unexplained weight loss, unexplained changes in bowel or bladder problems, unexplained stumbling or dropping things, osteoporosis, and spinal surgery  Exercise history: completed pulmonary rehab in June (did it in 2 months instead of 4 because he went daily).   SUBJECTIVE STATEMENT: Patient states he is doing well with no pain. He states he accidentally got his appt time incorrect, so he actually needs to leave at 10:20am since his ride will be coming then.   Top three challenges: Walking up steps   PAIN: Are you having pain?  NPRS: Current: 0/10   From initial evaluation 09/05/23:  Best: 2/10, Worst: 8/10. Pain location: Central low back and down left leg to knee Pain description: Aches in leg and sharp in back.  Aggravating factors: Laying down at night, going up stairs, bending over the wrong way (that is when he feels the sciatic nerve issue), walking, standing up, pain never goes away.  Easing factors: pillow at back when sitting, elevating legs in chair, being still, hot/cold compress, hot shower, medications (but he thinks he is overdosing himself on the medications, so he thinks his body has gotten used to it and it is not helping any more, he does not take narcotics).   PRECAUTIONS: don't be out in the heat. Avoid extreme cold weather. Has stage 4 COPD and restrictive disease.  PATIENT GOALS: I want to be able to do regular things, walk out and lift things He does not even lift his 13 lb cat He has to get someone to take cases of water up to the apartment. He would  like to make it through all of the sessions and make it to a good outcome.    OBJECTIVE  TREATMENT         Neuromuscular Re-education: a technique or exercise performed with the goal of improving the level of communication between the body and the brain, such as for balance, motor control, muscle activation patterns, coordination, desensitization, quality of muscle contraction, proprioception, and/or kinesthetic sense needed for successful and safe completion of functional activities.   Quadruped pelvic tilt AROM with goal of finding pain free range and isolating needed muscles while relaxing compensatory and  unneccessary muscle use 1x10 after finding pain free range  Quadruped hip hikes with one knee on folded towel 1x10 each side with 10 second hold  Quadruped alternating shoulder flexion reach for multifidi and lumbopelvic control training. Bringing fist to chest before/after reach. 1x10 each side PT counting reps, patient focused on improving motor control  Quadruped alternating hip extension for multifidi and lumbopelvic control training. Toe digs in with knee lift, to press to extended knee with toe still digging in, to lift of whole leg, and reverse.  2x5 each side with 5 second hold Improved carry over, difficult  Child's Pose stretch between sets of quadruped activities to decrease tension at lumbar paraspinals  Pt required multimodal cuing for proper technique and to facilitate improved neuromuscular control, strength, range of motion, and functional ability resulting in improved performance and form.  PATIENT EDUCATION:  Education details: Dentist. Education on diagnosis, prognosis, POC, anatomy and physiology of current condition.  Person educated: Patient Education method: Explanation Education comprehension: verbalized understanding and needs further education  HOME EXERCISE PROGRAM: Access Code: [email or web address] URL:  [email or web address] Date: 09/29/2023 Prepared by: Camie Cleverly  Exercises - Sitting to Supine Roll  - Standing Lumbar Traction with Chair  - Supine Shoulder Flexion with Dowel  - 1 x daily - 3 sets - 10 reps - Quadruped Hip Hike on Foam  - 1 x daily - 1 sets - 10 reps - 5 seconds hold - Quadruped Alternating Arm Lift  - 1 x daily - 1 sets - 10 reps - 5 seconds hold - Beginner Front Arm Support  - 1 x daily - 1 sets - 5 reps - 5 second hold hold - Half Kneeling Hip Flexor Stretch  - 1 x daily - 1 sets - 3 reps - 1 min hold  HOME EXERCISE PROGRAM [[id number]]  Self Lumbar Traction  -   Self-Lumbar Traction  -  Repeat 10 Repetitions, Hold 10 Seconds, Complete 1 Set, Perform 2 Times a Day  HOME EXERCISE PROGRAM [[id number]]  Lock Clams -  Repeat 15 Repetitions, Complete 2 Sets, Perform 1 Times a Day  ASSESSMENT:  CLINICAL IMPRESSION: Patient's time for visit today limited due to him mistaking the time of his visit so his ride picked him up early. Today's session continued focus on lumbopelvic motor control with dissociation of limbs and improved active segmental support of the spine. Patient tolerated well, even without manual traction this visit. He also needed less cuing from PT to perform correctly. No increase in pain by end of session. Plan to update goals next session. Patient would benefit from continued management of limiting condition by skilled physical therapist to address remaining impairments and functional limitations to work towards stated goals and return to PLOF or maximal functional independence.   From initial PT exam on 09/05/2023: Patient is a 58 y.o. male referred to outpatient physical therapy with a medical diagnosis of chronic midline low back pain with left-sided sciatica who presents with signs and symptoms consistent with chronic low back pain with L sided radiculopathy. Patient very irritable today so physical exam limited to his tolerance. He  demonstrated decreased myotomes at the L foot (ankle DF, PF, eversion, and great toe extension), decreased achilles DTR (S1), and lower leg/foot sensation on left compared to right. Unable to tolerate nerve tension testing today, but pt has increased pain with knee flexion and is unable to relax quad. He had increased tension muscles of thoracic spine and low back, L lumbar paraspinals  and QL the worst. He had decreased mobility to CPA attempt in thoracic spine and was unable to tolerate pressure to assess that in lumbar spine. He had allodynia to light touch over thoracic spine and left lumbar spine. He demonstrated avoidance of weight bearing on L LE which significantly limits his mobility and increases risk for falls. He likely has load and nerve tension sensitivity possibly with multidirectional position sensitivity. Patient presents with significant pain, ROM, nerve tension, joint stiffness, gait, balance, muscle tension, sensation, motor control, knowledge, muscle performance (strength/power/endurance), DTR, myotome, and activity tolerance impairments that are limiting ability to complete his usual activities such as sleeping, rising, walking, ADLs, IADLs, stairs, standing, homemaking, social life, traveling, personal care, lifting, bending, and sitting without difficulty. Patient will benefit from skilled physical therapy intervention to address current body structure impairments and activity limitations to improve function and work towards goals set in current POC in order to return to prior level of function or maximal functional improvement.    OBJECTIVE IMPAIRMENTS: Abnormal gait, cardiopulmonary status limiting activity, decreased activity tolerance, decreased balance, decreased coordination, decreased endurance, decreased knowledge of condition, decreased knowledge of use of DME, decreased mobility, difficulty walking, decreased ROM, decreased strength, hypomobility, increased muscle spasms, impaired  flexibility, impaired sensation, impaired tone, improper body mechanics, and pain.   ACTIVITY LIMITATIONS: carrying, lifting, bending, sitting, standing, squatting, sleeping, stairs, transfers, bed mobility, dressing, hygiene/grooming, and locomotion level  PARTICIPATION LIMITATIONS: meal prep, cleaning, laundry, interpersonal relationship, driving, shopping, community activity, and occupation  PERSONAL FACTORS: Fitness, Past/current experiences, Time since onset of injury/illness/exacerbation, and 3+ comorbidities:  Hypertension; Asthma, allergic, mild persistent, uncomplicated; Major depression, recurrent (HCC); Mixed hyperlipidemia; Chronic midline low back pain with left-sided sciatica; Prediabetes; Nicotine dependence with current use; History of diverticulosis; Paresthesia of right upper extremity; Restrictive lung disease; Mixed restrictive and obstructive lung disease (HCC); Asthma-COPD overlap syndrome (HCC); 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 he  has a past medical history of Allergy, Asthma, COPD (chronic obstructive pulmonary disease) (HCC), Depression, Gynecomastia, male (07/21/2022), and Hypertension. he  has a past surgical history that includes Hernia repair are also affecting patient's functional outcome.   REHAB POTENTIAL: Good  CLINICAL DECISION MAKING: Evolving/moderate complexity  EVALUATION COMPLEXITY: Moderate   GOALS: Goals reviewed with patient? No  SHORT TERM GOALS: Target date: 09/19/2023  Patient will be independent with initial home exercise program for self-management of symptoms. Baseline: Initial HEP to be provided at visit 2 as appropriate (09/05/23); Goal status: MET  LONG TERM GOALS: Target date: 11/28/2023  Patient will be independent  with a long-term home exercise program for self-management of symptoms.  Baseline: Initial HEP to be provided at visit 2 as appropriate (09/05/23); performing them but not every day (10/03/2023);  Goal status: In progress  2.  Patient will demonstrate improved Oswestry Disability Index (ODI) to equal or less than 10% to demonstrate improvement in overall condition and self-reported functional ability.  Baseline: 56% (09/05/23); Goal status: In progress  3.  Patient will demonstrate full lumbar and LE ROM without increased symptoms beyond mild intermittent end range discomfort to improve his ability to perform ADLs and IADLs.  Baseline: severely limited where measured - see objective (09/05/23); Goal status: In progress  4.  Patient will demonstrate B LE strength equal bilaterally or at least 4+/5 to improve  his ability to complete ADLs, IADLs, and leisure activities such as daily walking with less difficulty.  Baseline: L ankle/great toe myotomes 3/5 R ankle/great toe myotomes 4+/5 (09/05/23); Goal status: In progress  6.  Patient will report NPRS equal or less than 5/10 during functional activities during the last 2 weeks to improve their abilitly to complete community, work and/or recreational activities with less limitation. Baseline: 8/10 (09/05/23); 7/10 but not daily, uses traction to help (10/03/2023);  Goal status: In progress   PLAN:  PT FREQUENCY: 2x/week  PT DURATION: 12 weeks  PLANNED INTERVENTIONS: 97164- PT Re-evaluation, 97750- Physical Performance Testing, 97110-Therapeutic exercises, 97530- Therapeutic activity, 97112- Neuromuscular re-education, 97535- Self Care, [id number]- Manual therapy, [phone]- Gait training, [phone]- Aquatic Therapy, [phone]- Electrical stimulation (unattended), [phone]- Traction (mechanical), [phone] (1-2 muscles), [phone] (3+ muscles)- Dry Needling, Patient/Family education, Balance training, Stair training, Joint mobilization, Spinal mobilization, DME instructions,  Cryotherapy, and Moist heat.  PLAN FOR NEXT SESSION: update HEP as appropriate, interventions for pain relief and to decrease muscle guarding, further assessment as appropriate, education on mechanical stressors and modifications in daily life, progressing to retraining of motor control, rebuilding of strength and functional ability as tolerance improves. Education. Manual therapy.    Camie SAUNDERS. Juli, PT, DPT, Cert. MDT 10/03/23, 10:22 AM  [facility] [address] [place], [place] [address] P: [phone] I F: [phone]

## 2023-10-04 ENCOUNTER — Other Ambulatory Visit: Payer: Self-pay | Admitting: Pulmonary Disease

## 2023-10-04 ENCOUNTER — Other Ambulatory Visit (HOSPITAL_COMMUNITY): Payer: Self-pay

## 2023-10-04 ENCOUNTER — Other Ambulatory Visit: Payer: Self-pay | Admitting: Family Medicine

## 2023-10-04 ENCOUNTER — Other Ambulatory Visit: Payer: Self-pay

## 2023-10-04 DIAGNOSIS — R053 Chronic cough: Secondary | ICD-10-CM

## 2023-10-04 DIAGNOSIS — J449 Chronic obstructive pulmonary disease, unspecified: Secondary | ICD-10-CM

## 2023-10-04 MED ORDER — CHLORTHALIDONE 25 MG PO TABS
25.0000 mg | ORAL_TABLET | Freq: Every day | ORAL | 3 refills | Status: AC
  Filled 2023-10-04: qty 90, 90d supply, fill #0
  Filled 2023-12-10: qty 90, 90d supply, fill #1
  Filled 2024-01-11 – 2024-02-12 (×3): qty 90, 90d supply, fill #2

## 2023-10-04 MED ORDER — ROSUVASTATIN CALCIUM 10 MG PO TABS
10.0000 mg | ORAL_TABLET | Freq: Every day | ORAL | 3 refills | Status: AC
  Filled 2023-10-04: qty 90, 90d supply, fill #0
  Filled 2023-12-10: qty 90, 90d supply, fill #1
  Filled 2024-01-11 – 2024-02-12 (×3): qty 90, 90d supply, fill #2

## 2023-10-04 MED ORDER — MONTELUKAST SODIUM 10 MG PO TABS
10.0000 mg | ORAL_TABLET | Freq: Every day | ORAL | 3 refills | Status: AC
  Filled 2023-10-04: qty 90, 90d supply, fill #0
  Filled 2023-12-10: qty 90, 90d supply, fill #1
  Filled 2024-01-11 – 2024-02-12 (×2): qty 90, 90d supply, fill #2

## 2023-10-05 ENCOUNTER — Encounter (HOSPITAL_COMMUNITY): Payer: Self-pay

## 2023-10-05 ENCOUNTER — Other Ambulatory Visit (HOSPITAL_COMMUNITY): Payer: Self-pay

## 2023-10-05 ENCOUNTER — Encounter: Payer: Self-pay | Admitting: Physical Therapy

## 2023-10-05 ENCOUNTER — Ambulatory Visit: Payer: MEDICAID | Admitting: Physical Therapy

## 2023-10-05 DIAGNOSIS — M792 Neuralgia and neuritis, unspecified: Secondary | ICD-10-CM

## 2023-10-05 DIAGNOSIS — M5459 Other low back pain: Secondary | ICD-10-CM | POA: Diagnosis not present

## 2023-10-05 DIAGNOSIS — M79652 Pain in left thigh: Secondary | ICD-10-CM

## 2023-10-05 DIAGNOSIS — M546 Pain in thoracic spine: Secondary | ICD-10-CM

## 2023-10-05 DIAGNOSIS — R262 Difficulty in walking, not elsewhere classified: Secondary | ICD-10-CM

## 2023-10-05 MED ORDER — IPRATROPIUM-ALBUTEROL 0.5-2.5 (3) MG/3ML IN SOLN
3.0000 mL | Freq: Four times a day (QID) | RESPIRATORY_TRACT | 3 refills | Status: AC | PRN
  Filled 2023-10-05 – 2023-12-10 (×2): qty 360, 30d supply, fill #0
  Filled 2024-01-11: qty 360, 30d supply, fill #1
  Filled 2024-02-12: qty 360, 30d supply, fill #2

## 2023-10-05 NOTE — Therapy (Signed)
 OUTPATIENT PHYSICAL THERAPY TREATMENT / PROGRESS NOTE Dates of reporting from 09/05/2023 to 10/05/2023   Patient Name: Larry Maxwell MRN: [id number] DOB:1965-05-08, 58 y.o., male Today's Date: 10/05/2023  END OF SESSION:  PT End of Session - 10/05/23 0949     Visit Number 9    Number of Visits 17    Date for PT Re-Evaluation 11/28/23    Authorization Type [employer] reporting period from 09/05/2023    Authorization Time Period [place] [id number] for 8 PT vsts from 8/13-03/05/24    Authorization - Visit Number 8    Authorization - Number of Visits 8    Progress Note Due on Visit 10    PT Start Time 0948    PT Stop Time 1026   -6 min for bathroom/oswestry   PT Time Calculation (min) 38 min    Activity Tolerance Patient tolerated treatment well;Patient limited by fatigue    Behavior During Therapy [facility] for tasks assessed/performed             Past Medical History:  Diagnosis Date   Allergy    Asthma    COPD (chronic obstructive pulmonary disease) (HCC)    Depression    Gynecomastia, male 07/21/2022   Hypertension    Past Surgical History:  Procedure Laterality Date   HERNIA REPAIR     Patient Active Problem List   Diagnosis Date Noted   Acute leg pain, left 04/24/2023   Eczema 04/24/2023   Blood glucose elevated 02/02/2023   Epigastric discomfort 02/02/2023   History of pancreatitis 02/02/2023   Chronic kidney disease, stage 3a (HCC) 02/02/2023   Insomnia due to medical condition 12/15/2022   Mixed restrictive and obstructive lung disease (HCC) 10/25/2022   Asthma-COPD overlap syndrome (HCC) 10/25/2022   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) 10/25/2022   Restrictive lung disease 08/25/2022   Paresthesia of right upper extremity 08/17/2022   History of diverticulosis 08/09/2022   Nicotine dependence with current use 08/04/2022   Prediabetes 08/02/2022   Hypertension 07/21/2022   Asthma, allergic, mild persistent, uncomplicated  07/21/2022   Major depression, recurrent (HCC) 07/21/2022   Mixed hyperlipidemia 07/21/2022   Chronic midline low back pain with left-sided sciatica 07/21/2022    PCP: Donzella Lauraine SAILOR, DO  REFERRING PROVIDER: Donzella Lauraine SAILOR, DO  REFERRING DIAG: chronic midline low back pain with left-sided sciatica  Rationale for Evaluation and Treatment: Rehabilitation  THERAPY DIAG:  Other low back pain  Pain in thoracic spine  Neuralgia and neuritis  Pain in left thigh  Difficulty in walking, not elsewhere classified  ONSET DATE: chronic but current exacerbation 2-3 months prior to initial PT evaluation.   SUBJECTIVE:  PERTINENT HISTORY:  Patient is a 58 y.o. male who presents to outpatient physical therapy with a referral for medical diagnosis chronic midline low back pain with left-sided sciatica. This patient's chief complaints consist of low back pain and left leg pain, leading to the following functional deficits: difficulty with usual activities such as sleeping, rising, walking, ADLs, IADLs, stairs, standing, homemaking, social life, traveling, personal care, lifting, bending, and sitting. 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 he  has a past medical history of Allergy, Asthma, COPD (chronic obstructive pulmonary disease) (HCC), Depression, Gynecomastia, male (07/21/2022), and Hypertension. he  has a past surgical history that includes Hernia repair.  He just finished treatment for his lung disease. He finished pulmonary rehab back in January. Patient denies hx of cancer, stroke, seizures, heart problems, diabetes, unexplained weight loss, unexplained changes in bowel or bladder problems, unexplained stumbling or dropping things, osteoporosis, and spinal surgery  Exercise history: completed pulmonary rehab in June (did it in 2 months instead of 4 because he went daily).   SUBJECTIVE STATEMENT: Patient reports he is doing well and doesn't have any pain upon arrival. He states he still has difficulty walking up the steps and he has pain in his left low back, hip and leg at times. He wants to start exercising more and joint the fitness center at the hospital, but he has not done it yet. He would like to continue PT to make sure he continues to improve.   Top three challenges: Walking up steps   PAIN: Are you having pain?  NPRS: Current: 0/10 at start of visit; 3/10 in left low back and leg by end of session (after all the testing)  From initial evaluation 09/05/23:  Best: 2/10, Worst: 8/10. Pain location: Central low back and down left leg to knee Pain description: Aches in leg and sharp in back.  Aggravating factors: Laying down at night, going up stairs, bending over the wrong way (that is when he feels the sciatic nerve issue), walking, standing up, pain never goes away.  Easing factors: pillow at back when sitting, elevating legs in chair, being still, hot/cold compress, hot shower, medications (but he thinks he is overdosing himself on the medications, so he thinks his body has gotten used to it and it is not  helping any more, he does not take narcotics).   PRECAUTIONS: don't be out in the heat. Avoid extreme cold weather. Has stage 4 COPD and restrictive disease.  PATIENT GOALS: I want to be able to do regular things, walk out and lift things He does not even lift his 13 lb cat He has to get someone to take cases of water up to the apartment. He would like to make it through all of the sessions and make it to a good outcome.    OBJECTIVE  SELF-REPORTED FUNCTION Modified Oswestry Disability Index (mODI): 28% (range 0-100%)   STANDING Lumbar AROM (instructed to stop when pain increases) Flexion: fingers to  mid shin, pain/stretch L glute and low back Extension: 10% pain! at L low back Rotation:  R: 50%  L: 50% Side Flexion:  R: 40% L back pain L = 20% L back pain!  SUPINE Neurological Testing Neural Tension Testing Straight Leg Raise:  From supine R  = positve but not concordant L  = pain in supine, worse than right with SLR With pre-flexed hip/knee R = positive for tension L = positive for concordant pain Reflex Testing Patellar Reflex (L3-L4):  R: 2+ L: 2+  Hamstring Reflex (L5) R: 0 L: 0  Achilles Reflex (S1) R: 0 L: 0   Myotome Testing Ankle Eversion (S1) R: 5/5 L: 5/5 pain in lower back and ankle Anterior Tibialis (L4-L5) R: 5/5 L: 5/5 Extensor Hallucis Longus (L5) R: 4/5 L: 4/5   Quadriceps (L3) (SEATED) R: 5/5 L: 5/5 a little less solid  Hamstring (L5) (SUPINE) R: 5/5 L: 4/5 due to pain (did not finish 3 reps)   Hip Abductors (L5, S1) SIDELYING R: 4+/5 L: 4+/5 pain at left lateral hip     TREATMENT         Therapeutic exercise: therapeutic exercises that incorporate ONE parameter at one or more areas of the body to centralize symptoms, develop strength and endurance, range of motion, and flexibility required for successful completion of functional activities. Lumbar AROM testing (see above)  Neuromuscular Re-education: a technique or exercise  performed with the goal of improving the level of communication between the body and the brain, such as for balance, motor control, muscle activation patterns, coordination, desensitization, quality of muscle contraction, proprioception, and/or kinesthetic sense needed for successful and safe completion of functional activities.   Neurologic testing to assess progress (see above)  Manual therapy: to reduce pain and tissue tension, improve range of motion, neuromodulation, in order to promote improved ability to complete functional activities. HOOKLYING Intermittent manual lumbar traction with belt around back of knees 3 min 10 seconds on/off Decreasing pain but had to stop short due to pt's ride coming early and not wanting to miss it  Pt required multimodal cuing for proper technique and to facilitate improved neuromuscular control, strength, range of motion, and functional ability resulting in improved performance and form.  PATIENT EDUCATION:  Education details: Dentist. Education on diagnosis, prognosis, POC, anatomy and physiology of current condition.  Person educated: Patient Education method: Explanation Education comprehension: verbalized understanding and needs further education  HOME EXERCISE PROGRAM: Access Code: [email or web address] URL: [email or web address] Date: 09/29/2023 Prepared by: Camie Cleverly  Exercises - Sitting to Supine Roll  - Standing Lumbar Traction with Chair  - Supine Shoulder Flexion with Dowel  - 1 x daily - 3 sets - 10 reps - Quadruped Hip Hike on Foam  - 1 x daily - 1 sets - 10 reps - 5 seconds hold - Quadruped Alternating Arm Lift  - 1 x daily - 1 sets - 10 reps - 5 seconds hold - Beginner Front Arm Support  - 1 x daily - 1 sets - 5 reps - 5 second hold hold - Half Kneeling Hip Flexor Stretch  - 1 x daily - 1 sets - 3 reps - 1 min hold  HOME EXERCISE PROGRAM [[id number]]  Self Lumbar Traction  -   Self-Lumbar Traction  -  Repeat  10 Repetitions, Hold 10 Seconds, Complete 1 Set, Perform 2 Times a Day  HOME EXERCISE PROGRAM [[id number]]  Lock Clams -  Repeat 15 Repetitions, Complete 2 Sets, Perform 1  Times a Day  ASSESSMENT:  CLINICAL IMPRESSION: Patient has attended 9 physical therapy sessions since starting current episode of care on 09/05/2023. Since starting PT, pt's condition and function has dramatically improved per observation and examination. He reports performing regular HEP at home and walking more. Oswestry improved from 56% to 28% today (MCID 9). He was able to tolerate full lumbar AROM testing and LE DTR and myotome testing today, although painful. On is first day of PT he was unable to tolerate a full exam or fully flex his left knee due to leg pain. Today, he still had pain provocation with tests and positive L neural tension test but he showed significant improvement. He has not fully recovered prior level of function and has excellent potential for further improvement with continued PT (as well as risk for regression without continuing PT). Patient would benefit from continued management of limiting condition by skilled physical therapist to address remaining impairments and functional limitations to work towards stated goals and return to PLOF or maximal functional independence. Plan to continue PT as able pending insurance authorization (due to insurmountable financial limitations without insurance coverage).   From initial PT exam on 09/05/2023: Patient is a 57 y.o. male referred to outpatient physical therapy with a medical diagnosis of chronic midline low back pain with left-sided sciatica who presents with signs and symptoms consistent with chronic low back pain with L sided radiculopathy. Patient very irritable today so physical exam limited to his tolerance. He demonstrated decreased myotomes at the L foot (ankle DF, PF, eversion, and great toe extension), decreased achilles DTR (S1), and lower leg/foot  sensation on left compared to right. Unable to tolerate nerve tension testing today, but pt has increased pain with knee flexion and is unable to relax quad. He had increased tension muscles of thoracic spine and low back, L lumbar paraspinals and QL the worst. He had decreased mobility to CPA attempt in thoracic spine and was unable to tolerate pressure to assess that in lumbar spine. He had allodynia to light touch over thoracic spine and left lumbar spine. He demonstrated avoidance of weight bearing on L LE which significantly limits his mobility and increases risk for falls. He likely has load and nerve tension sensitivity possibly with multidirectional position sensitivity. Patient presents with significant pain, ROM, nerve tension, joint stiffness, gait, balance, muscle tension, sensation, motor control, knowledge, muscle performance (strength/power/endurance), DTR, myotome, and activity tolerance impairments that are limiting ability to complete his usual activities such as sleeping, rising, walking, ADLs, IADLs, stairs, standing, homemaking, social life, traveling, personal care, lifting, bending, and sitting without difficulty. Patient will benefit from skilled physical therapy intervention to address current body structure impairments and activity limitations to improve function and work towards goals set in current POC in order to return to prior level of function or maximal functional improvement.    OBJECTIVE IMPAIRMENTS: Abnormal gait, cardiopulmonary status limiting activity, decreased activity tolerance, decreased balance, decreased coordination, decreased endurance, decreased knowledge of condition, decreased knowledge of use of DME, decreased mobility, difficulty walking, decreased ROM, decreased strength, hypomobility, increased muscle spasms, impaired flexibility, impaired sensation, impaired tone, improper body mechanics, and pain.   ACTIVITY LIMITATIONS: carrying, lifting, bending,  sitting, standing, squatting, sleeping, stairs, transfers, bed mobility, dressing, hygiene/grooming, and locomotion level  PARTICIPATION LIMITATIONS: meal prep, cleaning, laundry, interpersonal relationship, driving, shopping, community activity, and occupation  PERSONAL FACTORS: Fitness, Past/current experiences, Time since onset of injury/illness/exacerbation, and 3+ comorbidities:  Hypertension; Asthma, allergic, mild persistent, uncomplicated; Major depression,  recurrent (HCC); Mixed hyperlipidemia; Chronic midline low back pain with left-sided sciatica; Prediabetes; Nicotine dependence with current use; History of diverticulosis; Paresthesia of right upper extremity; Restrictive lung disease; Mixed restrictive and obstructive lung disease (HCC); Asthma-COPD overlap syndrome (HCC); 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 he  has a past medical history of Allergy, Asthma, COPD (chronic obstructive pulmonary disease) (HCC), Depression, Gynecomastia, male (07/21/2022), and Hypertension. he  has a past surgical history that includes Hernia repair are also affecting patient's functional outcome.   REHAB POTENTIAL: Good  CLINICAL DECISION MAKING: Evolving/moderate complexity  EVALUATION COMPLEXITY: Moderate   GOALS: Goals reviewed with patient? No  SHORT TERM GOALS: Target date: 09/19/2023  Patient will be independent with initial home exercise program for self-management of symptoms. Baseline: Initial HEP to be provided at visit 2 as appropriate (09/05/23); Goal status: MET  LONG TERM GOALS: Target date: 11/28/2023  Patient will be independent with a long-term home exercise program for self-management of symptoms.  Baseline: Initial HEP to be provided at visit 2 as appropriate  (09/05/23); performing them but not every day (10/03/2023);  Goal status: In progress  2.  Patient will demonstrate improved Oswestry Disability Index (ODI) to equal or less than 10% to demonstrate improvement in overall condition and self-reported functional ability.  Baseline: 56% (09/05/23); 28% (10/05/2023);  Goal status: In progress  3.  Patient will demonstrate full lumbar and LE ROM without increased symptoms beyond mild intermittent end range discomfort to improve his ability to perform ADLs and IADLs.  Baseline: severely limited where measured - see objective (09/05/23); significantly improved but still limited and painful (10/05/2023);  Goal status: In progress  4.  Patient will demonstrate B LE strength equal bilaterally or at least 4+/5 to improve his ability to complete ADLs, IADLs, and leisure activities such as daily walking with less difficulty.  Baseline: L ankle/great toe myotomes 3/5 R ankle/great toe myotomes 4+/5 (09/05/23); significantly improved, now weakest are bilateral great toe extension and L knee flexion 4/5 (10/05/2023);  Goal status: In progress  6.  Patient will report NPRS equal or less than 5/10 during functional activities during the last 2 weeks to improve their abilitly to complete community, work and/or recreational activities with less limitation. Baseline: 8/10 (09/05/23); 7/10 but not daily, uses traction to help (10/03/2023);  Goal status: In progress   PLAN:  PT FREQUENCY: 2x/week  PT DURATION: 12 weeks  PLANNED INTERVENTIONS: 97164- PT Re-evaluation, 97750- Physical Performance Testing, 97110-Therapeutic exercises, 97530- Therapeutic activity, 97112- Neuromuscular re-education, 97535- Self Care, 02859- Manual therapy, [phone]- Gait training, [phone]- Aquatic Therapy, [phone]- Electrical stimulation (unattended), [phone]- Traction (mechanical), [phone] (1-2 muscles), 20561 (3+ muscles)- Dry Needling, Patient/Family education, Balance training, Stair training, Joint  mobilization, Spinal mobilization, DME instructions, Cryotherapy, and Moist heat.  PLAN FOR NEXT SESSION: update HEP as appropriate, interventions for pain relief and to decrease muscle guarding, further assessment as appropriate, education on mechanical stressors and modifications in daily life, progressing to retraining of motor control, rebuilding of strength and functional ability as tolerance improves. Education. Manual therapy.    Camie SAUNDERS. Juli, PT, DPT, Cert. MDT 10/05/23, 2:26 PM  [facility] [address] [place], [place] [address] P: [phone] I F: [phone]

## 2023-10-12 ENCOUNTER — Other Ambulatory Visit: Payer: Self-pay | Admitting: Medical Genetics

## 2023-10-13 ENCOUNTER — Telehealth: Payer: Self-pay

## 2023-10-13 ENCOUNTER — Other Ambulatory Visit (HOSPITAL_COMMUNITY): Payer: Self-pay

## 2023-10-13 NOTE — Progress Notes (Signed)
 Complex Care Management Note  Care Guide Note 10/13/2023 Name: Larry Maxwell MRN: [id number] DOB: 02-May-1965  Larry Maxwell is a 58 y.o. year old male who sees Pardue, Lauraine SAILOR, DO for primary care. I reached out to Marty JONELLE Slicker by phone today to offer complex care management services.  [name] was given information about Complex Care Management services today including:   The Complex Care Management services include support from the care team which includes your Nurse Care Manager, Clinical Social Worker, or Pharmacist.  The Complex Care Management team is here to help remove barriers to the health concerns and goals most important to you. Complex Care Management services are voluntary, and the patient may decline or stop services at any time by request to their care team member.   Complex Care Management Consent Status: Patient agreed to services and verbal consent obtained.   Follow up plan:  Telephone appointment with complex care management team member scheduled for:  10/21/23 at 9:00 a.m.   Encounter Outcome:  Patient Scheduled  D[facility]  [facility] VBCI Assistant Direct Dial: [phone]  Fax: [phone]

## 2023-10-17 ENCOUNTER — Other Ambulatory Visit: Payer: MEDICAID

## 2023-10-19 ENCOUNTER — Other Ambulatory Visit
Admission: RE | Admit: 2023-10-19 | Discharge: 2023-10-19 | Disposition: A | Discharge status: Home / Self Care | Payer: MEDICAID | Source: Ambulatory Visit | Attending: Medical Genetics | Admitting: Medical Genetics

## 2023-10-21 ENCOUNTER — Other Ambulatory Visit: Payer: MEDICAID

## 2023-10-21 NOTE — Patient Instructions (Signed)
 Tailored Plan Medicaid On July 26, 2022 some people on [place] Medicaid will move to a new kind of Medicaid health plan called a Tailored Plan. Tailored Plans cover your doctor visits, prescription drugs, and health care services.    If your [place] Medicaid will move to a Tailored Plan, you should have gotten a letter and welcome packet. If you're not sure, call your [place] Medicaid Enrollment Broker at [phone] and ask.  Check out these free materials, in [place] and [place], to learn more about your Tailored Plan: [email or web address]/Tailored-Plans/Toolkit  Tailored Care Management Services  TCM services are available to you now. If you are a Tailored Plan member or will be and want information about Tailored Care Management Services including rides to appointments and community and home services, call the Care Management provider for your county of residence:    [facility] ([place], [place])  Member Services: [phone] Behavioral Health Crisis Line: [phone], [place], [place], [place], [place])  Member Services: [phone] Behavioral Health Crisis Line: 613-871-9895  [facility] [place]) Member Services: [phone] Behavioral Health Crisis Line: [phone]

## 2023-10-21 NOTE — Patient Outreach (Signed)
 Referral received from Pardue, Sarah N, DO for assistance with care management needs. This patient is enrolled in (name of health plan) and has associated care management benefits. I reached out to the health plan today to refer the patient to the assigned health plan care management team member.   Interventions:  MICAH BARNIER was advised to anticipate contact from the health plan for follow up about care management services and resources. SW mailed resources for housing, food, dental and vision. SW contacted [place] and spoke with rep Garen patient is not connected with a Futures trader, sw will provide [employer]'s information for patient to call.    Thersia Hoar, HEDWIG, MHA   [facility] Social Worker, Population Health [phone]

## 2023-10-26 ENCOUNTER — Ambulatory Visit: Payer: MEDICAID | Admitting: Clinical

## 2023-10-26 ENCOUNTER — Ambulatory Visit: Payer: MEDICAID | Attending: Family Medicine | Admitting: Physical Therapy

## 2023-10-26 ENCOUNTER — Encounter: Payer: Self-pay | Admitting: Physical Therapy

## 2023-10-26 DIAGNOSIS — F325 Major depressive disorder, single episode, in full remission: Secondary | ICD-10-CM | POA: Diagnosis not present

## 2023-10-26 DIAGNOSIS — M792 Neuralgia and neuritis, unspecified: Secondary | ICD-10-CM | POA: Insufficient documentation

## 2023-10-26 DIAGNOSIS — M546 Pain in thoracic spine: Secondary | ICD-10-CM | POA: Diagnosis present

## 2023-10-26 DIAGNOSIS — M79652 Pain in left thigh: Secondary | ICD-10-CM | POA: Insufficient documentation

## 2023-10-26 DIAGNOSIS — M5459 Other low back pain: Secondary | ICD-10-CM | POA: Diagnosis present

## 2023-10-26 DIAGNOSIS — R262 Difficulty in walking, not elsewhere classified: Secondary | ICD-10-CM | POA: Insufficient documentation

## 2023-10-26 NOTE — Therapy (Signed)
 OUTPATIENT PHYSICAL THERAPY TREATMENT / PROGRESS NOTE/ RE-CERTIFICATION Dates of reporting from 09/05/2023 to 10/26/2023    Patient Name: Larry Maxwell MRN: [id number] DOB:1965-11-01, 58 y.o., male Today's Date: 10/26/2023  END OF SESSION:  PT End of Session - 10/26/23 1737     Visit Number 10    Number of Visits 17    Date for Recertification  01/18/24    Authorization Type [employer] reporting period from 09/05/2023    Authorization Time Period [place] [phone] for 4 PT vsts from 9/22-3/21/26    Authorization - Visit Number 1    Authorization - Number of Visits 4    Progress Note Due on Visit 10    PT Start Time 1736    PT Stop Time 1817    PT Time Calculation (min) 41 min    Activity Tolerance Patient tolerated treatment well    Behavior During Therapy [facility] for tasks assessed/performed           Past Medical History:  Diagnosis Date   Allergy    Asthma    COPD (chronic obstructive pulmonary disease) (HCC)    Depression    Gynecomastia, male 07/21/2022   Hypertension    Past Surgical History:  Procedure Laterality Date   HERNIA REPAIR     Patient Active Problem List   Diagnosis Date Noted   Acute leg pain, left 04/24/2023   Eczema 04/24/2023   Blood glucose elevated 02/02/2023   Epigastric discomfort 02/02/2023   History of pancreatitis 02/02/2023   Chronic kidney disease, stage 3a (HCC) 02/02/2023   Insomnia due to medical condition 12/15/2022   Mixed restrictive and obstructive lung disease (HCC) 10/25/2022   Asthma-COPD overlap syndrome (HCC) 10/25/2022   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) 10/25/2022   Restrictive lung disease 08/25/2022   Paresthesia of right upper extremity 08/17/2022   History of diverticulosis 08/09/2022   Nicotine dependence with current use 08/04/2022   Prediabetes 08/02/2022   Hypertension 07/21/2022   Asthma, allergic, mild persistent, uncomplicated 07/21/2022   Major depression,  recurrent 07/21/2022   Mixed hyperlipidemia 07/21/2022   Chronic midline low back pain with left-sided sciatica 07/21/2022    PCP: Donzella Lauraine SAILOR, DO  REFERRING PROVIDER: Donzella Lauraine SAILOR, DO  REFERRING DIAG: chronic midline low back pain with left-sided sciatica  Rationale for Evaluation and Treatment: Rehabilitation  THERAPY DIAG:  Other low back pain  Pain in thoracic spine  Neuralgia and neuritis  Pain in left thigh  Difficulty in walking, not elsewhere classified  ONSET DATE: chronic but current exacerbation 2-3 months prior to initial PT evaluation.   SUBJECTIVE:  PERTINENT HISTORY:  Patient is a 58 y.o. male who presents to outpatient physical therapy with a referral for medical diagnosis chronic midline low back pain with left-sided sciatica. This patient's chief complaints consist of low back pain and left leg pain, leading to the following functional deficits: difficulty with usual activities such as sleeping, rising, walking, ADLs, IADLs, stairs, standing, homemaking, social life, traveling, personal care, lifting, bending, and sitting. 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 he  has a past medical history of Allergy, Asthma, COPD (chronic obstructive pulmonary disease) (HCC), Depression, Gynecomastia, male (07/21/2022), and Hypertension. he  has a past surgical history that includes Hernia repair.  He just finished treatment for his lung disease. He finished pulmonary rehab back in January. Patient denies hx of cancer, stroke, seizures, heart problems, diabetes, unexplained weight loss, unexplained changes in bowel or bladder problems, unexplained stumbling or dropping things, osteoporosis, and spinal surgery  Exercise history: completed pulmonary rehab in June (did it in 2 months instead of 4 because he went daily).   SUBJECTIVE STATEMENT: Patient reports he is doing well and has not had pain since Sunday morning. He did not take his pain medications Saturday night. He has not been able to go to the gym yet, but has been doing a lot of walking. He is still using the handrail to go up/down the steps.  He feels like he has improved a lot since last PT session. He has been doing his HEP sometimes.   Top three challenges: Walking up steps   PAIN: Are you having pain?  NPRS: 0/10  From initial evaluation 09/05/23:  Best: 2/10, Worst: 8/10. Pain location: Central low back and down left leg to knee Pain description: Aches in leg and sharp in back.  Aggravating factors: Laying down at night, going up stairs, bending over the wrong way (that is when he feels the sciatic nerve issue), walking, standing up, pain never goes away.  Easing factors: pillow at back when sitting, elevating legs in chair, being still, hot/cold compress, hot shower, medications (but he thinks he is overdosing himself on the medications, so he thinks his body has gotten used to it and it is not helping any more, he does not take narcotics).   PRECAUTIONS: don't be out in the heat. Avoid extreme cold weather. Has stage 4  COPD and restrictive disease.  PATIENT GOALS: I want to be able to do regular things, walk out and lift things He does not even lift his 13 lb cat He has to get someone to take cases of water up to the apartment. He would like to make it through all of the sessions and make it to a good outcome.    OBJECTIVE  SELF-REPORTED FUNCTION Modified Oswestry Disability Index (mODI): 16% (range 0-100%)   STANDING Lumbar AROM (instructed to stop when pain increases) Flexion: fingers ankles Extension: 10% Rotation:  R: 50%  L: 50% Side Flexion:  R: 30% tug in L low  back L = 30% tug in L low back  SUPINE  LE PROM  B hip and knee PROM faily equal but with end range pain, L > R. Pt denies pain but cannot hide pain behaviors (guarding, grimacing).   Neurological Testing Neural Tension Testing Straight Leg Raise:  From supine R  = positive and better PF L  = pain with PF, concordant leg pain With pre-flexed hip/knee R = positive for tension L = positive for concordant pain, worse with PF Reflex Testing Patellar Reflex (L3-L4):  R: 1+ L: 1+  Hamstring Reflex (L5) R: 0 L: 0  Achilles Reflex (S1) R: 0 L: 0   Myotome Testing Ankle Eversion (S1) R: 5/5 L: 5/5  Anterior Tibialis (L4-L5) R: 5/5 L: 5/5 Extensor Hallucis Longus (L5) R: 5/5 L: 5/5   Quadriceps (L3) (SEATED, 1 rep) R: 5/5 L: 5/5  Hamstring (L5) (SUPINE) R: 5/5 L: 4+/5 due to pain (did not finish 3 reps)   Hip Abductors (L5, S1) SIDELYING, 1 rep R: 5/5 L: 5/5 pain at left lateral hip     TREATMENT         Therapeutic exercise: therapeutic exercises that incorporate ONE parameter at one or more areas of the body to centralize symptoms, develop strength and endurance, range of motion, and flexibility required for successful completion of functional activities. Lumbar and LE AROM testing (see above)  Neurologic testing to assess progress (see above)  Therapeutic activities: dynamic therapeutic activities  incorporating MULTIPLE parameters or areas of the body designed to achieve improved functional performance.  Air squat 1x10 plust a few more reps with form correction and education on how to set up and perform  Goblet squat 1x10 with 20#KB with education/cuing on form  Barbell back squat at rack 1x10 with 23#BB with education/cuing on hand placement, set up, unracking and reracking bar.    Pt required multimodal cuing for proper technique and to facilitate improved neuromuscular control, strength, range of motion, and functional ability resulting in improved performance and form.  PATIENT EDUCATION:  Education details: Dentist. Education on diagnosis, prognosis, POC, anatomy and physiology of current condition.  Person educated: Patient Education method: Explanation Education comprehension: verbalized understanding and needs further education  HOME EXERCISE PROGRAM: Access Code: [email or web address] URL: [email or web address] Date: 09/29/2023 Prepared by: Camie Cleverly  Exercises - Sitting to Supine Roll  - Standing Lumbar Traction with Chair  - Supine Shoulder Flexion with Dowel  - 1 x daily - 3 sets - 10 reps - Quadruped Hip Hike on Foam  - 1 x daily - 1 sets - 10 reps - 5 seconds hold - Quadruped Alternating Arm Lift  - 1 x daily - 1 sets - 10 reps - 5 seconds hold - Beginner Front Arm Support  - 1 x daily - 1 sets - 5 reps - 5 second hold hold - Half Kneeling Hip Flexor Stretch  - 1 x daily - 1 sets - 3 reps - 1 min hold  HOME EXERCISE PROGRAM [[id number]]  Self Lumbar Traction  -   Self-Lumbar Traction  -  Repeat 10 Repetitions, Hold 10 Seconds, Complete 1 Set, Perform 2 Times a Day  HOME EXERCISE PROGRAM [[id number]]  Lock Clams -  Repeat 15 Repetitions, Complete 2 Sets, Perform 1 Times a Day  ASSESSMENT:  CLINICAL IMPRESSION: Patient back to PT after being away since 10/05/23 while awaiting insurance authorization and an opening on the schedule he  could attend. He has attended 10 physical therapy  sessions since starting current episode of care on 09/05/2023. He was making good progress over the course of his care, and since last visit has continued to advance in his function and pain control. He has met or made progress towards all of his goals, but continues to have some L > R leg and hip pain. Progressed to squatting today including learning the basics of goblet and barbell back squats, as he has a goal of working out reglularly with squatting and TM use at the gym following PT discharge. He had good squat mechanics with excellent depth. He may benefit from learning bracing sequence to be able to tolerate heavier load and he had shoulder girdle stiffness that made it difficult to rest the bar on his upper traps instead of his neck without adjusting arm width closer to double thumb length from knurling. Plan to continue working on this next session. Patient would benefit from continued management of limiting condition by skilled physical therapist to address remaining impairments and functional limitations to work towards stated goals and return to PLOF or maximal functional independence.   From initial PT exam on 09/05/2023: Patient is a 58 y.o. male referred to outpatient physical therapy with a medical diagnosis of chronic midline low back pain with left-sided sciatica who presents with signs and symptoms consistent with chronic low back pain with L sided radiculopathy. Patient very irritable today so physical exam limited to his tolerance. He demonstrated decreased myotomes at the L foot (ankle DF, PF, eversion, and great toe extension), decreased achilles DTR (S1), and lower leg/foot sensation on left compared to right. Unable to tolerate nerve tension testing today, but pt has increased pain with knee flexion and is unable to relax quad. He had increased tension muscles of thoracic spine and low back, L lumbar paraspinals and QL the worst. He had  decreased mobility to CPA attempt in thoracic spine and was unable to tolerate pressure to assess that in lumbar spine. He had allodynia to light touch over thoracic spine and left lumbar spine. He demonstrated avoidance of weight bearing on L LE which significantly limits his mobility and increases risk for falls. He likely has load and nerve tension sensitivity possibly with multidirectional position sensitivity. Patient presents with significant pain, ROM, nerve tension, joint stiffness, gait, balance, muscle tension, sensation, motor control, knowledge, muscle performance (strength/power/endurance), DTR, myotome, and activity tolerance impairments that are limiting ability to complete his usual activities such as sleeping, rising, walking, ADLs, IADLs, stairs, standing, homemaking, social life, traveling, personal care, lifting, bending, and sitting without difficulty. Patient will benefit from skilled physical therapy intervention to address current body structure impairments and activity limitations to improve function and work towards goals set in current POC in order to return to prior level of function or maximal functional improvement.    OBJECTIVE IMPAIRMENTS: Abnormal gait, cardiopulmonary status limiting activity, decreased activity tolerance, decreased balance, decreased coordination, decreased endurance, decreased knowledge of condition, decreased knowledge of use of DME, decreased mobility, difficulty walking, decreased ROM, decreased strength, hypomobility, increased muscle spasms, impaired flexibility, impaired sensation, impaired tone, improper body mechanics, and pain.   ACTIVITY LIMITATIONS: carrying, lifting, bending, sitting, standing, squatting, sleeping, stairs, transfers, bed mobility, dressing, hygiene/grooming, and locomotion level  PARTICIPATION LIMITATIONS: meal prep, cleaning, laundry, interpersonal relationship, driving, shopping, community activity, and occupation  PERSONAL  FACTORS: Fitness, Past/current experiences, Time since onset of injury/illness/exacerbation, and 3+ comorbidities:  Hypertension; Asthma, allergic, mild persistent, uncomplicated; Major depression, recurrent (HCC); Mixed hyperlipidemia; Chronic midline low back  pain with left-sided sciatica; Prediabetes; Nicotine dependence with current use; History of diverticulosis; Paresthesia of right upper extremity; Restrictive lung disease; Mixed restrictive and obstructive lung disease (HCC); Asthma-COPD overlap syndrome (HCC); 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 he  has a past medical history of Allergy, Asthma, COPD (chronic obstructive pulmonary disease) (HCC), Depression, Gynecomastia, male (07/21/2022), and Hypertension. he  has a past surgical history that includes Hernia repair are also affecting patient's functional outcome.   REHAB POTENTIAL: Good  CLINICAL DECISION MAKING: Evolving/moderate complexity  EVALUATION COMPLEXITY: Moderate   GOALS: Goals reviewed with patient? No  SHORT TERM GOALS: Target date: 09/19/2023  Patient will be independent with initial home exercise program for self-management of symptoms. Baseline: Initial HEP to be provided at visit 2 as appropriate (09/05/23); Goal status: MET  LONG TERM GOALS: Target date: 11/28/2023. Target date updated to 01/18/2024 for all unmet goals on 10/26/23.   Patient will be independent with a long-term home exercise program for self-management of symptoms.  Baseline: Initial HEP to be provided at visit 2 as appropriate (09/05/23); performing them but not every day (10/03/2023); performing intermittently (10/26/2023);  Goal status: In progress  2.  Patient will demonstrate improved Oswestry Disability Index (ODI) to equal or  less than 10% to demonstrate improvement in overall condition and self-reported functional ability.  Baseline: 56% (09/05/23); 28% (10/05/2023); 16% (10/26/2023);  Goal status: In progress  3.  Patient will demonstrate full lumbar and LE ROM without increased symptoms beyond mild intermittent end range discomfort to improve his ability to perform ADLs and IADLs.  Baseline: severely limited where measured - see objective (09/05/23); significantly improved but still limited and painful (10/05/2023); much better with mild limitations and pain (10/26/2023);  Goal status: nearly met  4.  Patient will demonstrate B LE strength equal bilaterally or at least 4+/5 to improve his ability to complete ADLs, IADLs, and leisure activities such as daily walking with less difficulty.  Baseline: L ankle/great toe myotomes 3/5 R ankle/great toe myotomes 4+/5 (09/05/23); significantly improved, now weakest are bilateral great toe extension and L knee flexion 4/5 (10/05/2023);  Goal status: MET (10/26/23)  6.  Patient will report NPRS equal or less than 5/10 during functional activities during the last 2 weeks to improve their abilitly to complete community, work and/or recreational activities with less limitation. Baseline: 8/10 (09/05/23); 7/10 but not daily, uses traction to help (10/03/2023); 5/10 in the last two weeks - Sunday morning when he did not take his pain meds on Saturday night  - thinks highest would by 2/10 otherwise (10/26/2023);  Goal status: MET   PLAN:  PT FREQUENCY: 2x/week  PT DURATION: 12 weeks  PLANNED INTERVENTIONS: 97164- PT Re-evaluation, 97750- Physical Performance Testing, 97110-Therapeutic exercises, 97530- Therapeutic activity, 97112- Neuromuscular re-education, 97535- Self Care, [id number]- Manual therapy, [phone]- Gait training, [phone]- Aquatic Therapy, [phone]- Electrical stimulation (unattended), [phone]- Traction (mechanical), [phone] (1-2 muscles), 20561 (3+ muscles)- Dry Needling, Patient/Family  education, Balance training, Stair training, Joint mobilization, Spinal mobilization, DME instructions, Cryotherapy, and Moist heat.  PLAN FOR NEXT SESSION: update HEP as appropriate, interventions for pain relief and to decrease muscle guarding, further assessment as appropriate, education on mechanical stressors and modifications in daily life, progressing to retraining of motor control, rebuilding of strength and functional ability as tolerance improves. Education. Manual therapy.  Camie SAUNDERS. Juli, PT, DPT, Cert. MDT 10/26/23, 8:10 PM  [facility] [address] [place], [place] [address] P: [phone] I F: [phone]

## 2023-10-26 NOTE — Progress Notes (Signed)
 [facility] Counselor/Therapist Progress Note  Patient ID: Larry Maxwell, MRN: [id number],    Date: 10/26/2023  Time Spent: 9:32am - 10:24am : 52 minutes   Treatment Type: Individual Therapy  Reported Symptoms: none reported  Mental Status Exam: Appearance:  Neat and Well Groomed     Behavior: Appropriate  Motor: Normal  Speech/Language:  Clear and Coherent and Normal Rate  Affect: Appropriate  Mood: normal  Thought process: normal  Thought content:   WNL  Sensory/Perceptual disturbances:   WNL  Orientation: oriented to person, place, time/date, and situation  Attention: Good  Concentration: Good  Memory: WNL  Fund of knowledge:  Good  Insight:   Good  Judgment:  Good  Impulse Control: Good   Risk Assessment: Danger to Self:  No Patient denied current suicidal ideation  Self-injurious Behavior: No Danger to Others: No Patient denied current homicidal ideation Duty to Warn:no Physical Aggression / Violence:No  Access to Firearms a concern: No  Gang Involvement:No   Subjective:  Patient stated, things have been going good, I feel like I'm getting out more, socialize more, keep myself busy, do a lot of walking. Patient reported patient has been going to the gym twice a week, has been playing basketball on Saturday and Sunday mornings, and attending physical therapy for lower back pain. Patient stated, I try to keep myself busy and active in a lot of things, I don't want to sit around the house. Patient reported reading frequently.  Patient reported transportation is a barrier at times. Patient stated, I'm really beginning to open up, having people around that I can enjoy being around. 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 Patient reported an interest in volunteer opportunities related to food services. Patient stated, I feel good, I feel happy in response to current mood.    Interventions: Cognitive Behavioral Therapy. Clinician conducted session in person at clinician's office at [facility]. Reviewed events since last session and assessed for changes. Explored patient's activity level and positive activities patient has participated in since last session. Discussed barriers to participation in activities. Reviewed local transportation resources and provided information, such as, Designer, fashion/clothing. Provided psycho education related to mental health benefits of socialization and gratitude exercises. Explored patient's areas of interest and volunteer opportunities that align with patient's areas of interest to increase opportunities for socialization.     Collaboration of Care: Other not required at this time   Diagnosis:  Major depressive disorder, single episode, in full remission     Plan: Patient is to utilize Cognitive Behavioral Therapy, thought re-framing, mindfulness, positive activities, and coping strategies to maintain stability in mood  Frequency: monthly  Modality: individual      Long-term goal:   Maintain stability in mood 7 days per week per patient's report Target Date: 09/29/24  Progress: established 09/30/23    Short-term goal:  Identify and implement strategies to prevent relapse in mood Target Date: 07/05/24  Progress: established 09/30/23    Practice coping strategies daily to maintain stability in mood Target Date: 07/05/24  Progress: established 09/30/23    Maintain participation in enjoyable activities 7 days per week Target Date: 07/05/24  Progress: established 09/30/23    patient will verbalize positive statements regarding self and his ability to cope with life stressors Target Date: 07/06/23  Progress: as of 09/30/23 patient reported patient feels this goal has been met    Verbalize patient's thoughts and  feelings to through effective communication strategies per patient's report Target Date: 07/06/23  Progress: as of  09/30/23 patient reported patient feels this goal has been met    Identify, challenge, and replace negative thought patterns and negative self talk that contribute to feelings of depression with positive thoughts, beliefs, and positive self talk per patient's report Target Date: 07/06/23  Progress: as of 09/30/23 patient reported patient feels this goal has been met     Darice Seats, LCSW

## 2023-10-26 NOTE — Progress Notes (Signed)
   Darice Seats, LCSW

## 2023-10-28 LAB — GENECONNECT MOLECULAR SCREEN: Genetic Analysis Overall Interpretation: NEGATIVE

## 2023-11-01 ENCOUNTER — Ambulatory Visit: Payer: MEDICAID | Admitting: Physical Therapy

## 2023-11-02 ENCOUNTER — Ambulatory Visit (INDEPENDENT_AMBULATORY_CARE_PROVIDER_SITE_OTHER): Payer: MEDICAID | Admitting: Family Medicine

## 2023-11-02 ENCOUNTER — Encounter: Payer: Self-pay | Admitting: Family Medicine

## 2023-11-02 VITALS — BP 112/80 | HR 66 | Temp 98.2°F | Ht 73.5 in | Wt 201.1 lb

## 2023-11-02 DIAGNOSIS — Z23 Encounter for immunization: Secondary | ICD-10-CM | POA: Diagnosis not present

## 2023-11-02 NOTE — Progress Notes (Signed)
 Patient seen for nurse visit for vaccines and blood pressure check. Patient not seen or evaluated by physician.

## 2023-11-09 ENCOUNTER — Ambulatory Visit: Payer: MEDICAID | Admitting: Physical Therapy

## 2023-11-09 ENCOUNTER — Encounter: Payer: Self-pay | Admitting: Physical Therapy

## 2023-11-09 DIAGNOSIS — M792 Neuralgia and neuritis, unspecified: Secondary | ICD-10-CM

## 2023-11-09 DIAGNOSIS — R262 Difficulty in walking, not elsewhere classified: Secondary | ICD-10-CM

## 2023-11-09 DIAGNOSIS — M5459 Other low back pain: Secondary | ICD-10-CM | POA: Diagnosis not present

## 2023-11-09 DIAGNOSIS — M546 Pain in thoracic spine: Secondary | ICD-10-CM

## 2023-11-09 DIAGNOSIS — M79652 Pain in left thigh: Secondary | ICD-10-CM

## 2023-11-09 NOTE — Therapy (Signed)
 OUTPATIENT PHYSICAL THERAPY TREATMENT     Patient Name: Larry Maxwell MRN: [id number] DOB:[birth year], 58 y.o., male Today's Date: 11/09/2023  END OF SESSION:  PT End of Session - 11/09/23 1744     Visit Number 11    Number of Visits 17    Date for Recertification  01/18/24    Authorization Type [employer] reporting period from 09/05/2023    Authorization Time Period [place] [phone] for 4 PT vsts from 9/22-3/21/26    Authorization - Visit Number 2    Authorization - Number of Visits 4    Progress Note Due on Visit 10    PT Start Time 1737    PT Stop Time 1815    PT Time Calculation (min) 38 min    Activity Tolerance Patient tolerated treatment well    Behavior During Therapy [facility] for tasks assessed/performed            Past Medical History:  Diagnosis Date   Allergy    Asthma    COPD (chronic obstructive pulmonary disease) (HCC)    Depression    Gynecomastia, male 07/21/2022   Hypertension    Past Surgical History:  Procedure Laterality Date   HERNIA REPAIR     Patient Active Problem List   Diagnosis Date Noted   Acute leg pain, left 04/24/2023   Eczema 04/24/2023   Blood glucose elevated 02/02/2023   Epigastric discomfort 02/02/2023   History of pancreatitis 02/02/2023   Chronic kidney disease, stage 3a (HCC) 02/02/2023   Insomnia due to medical condition 12/15/2022   Mixed restrictive and obstructive lung disease (HCC) 10/25/2022   Asthma-COPD overlap syndrome (HCC) 10/25/2022   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) 10/25/2022   Restrictive lung disease 08/25/2022   Paresthesia of right upper extremity 08/17/2022   History of diverticulosis 08/09/2022   Nicotine dependence with current use 08/04/2022   Prediabetes 08/02/2022   Hypertension 07/21/2022   Asthma, allergic, mild persistent, uncomplicated 07/21/2022   Major depression, recurrent 07/21/2022   Mixed hyperlipidemia 07/21/2022   Chronic midline low back  pain with left-sided sciatica 07/21/2022    PCP: Donzella Lauraine SAILOR, DO  REFERRING PROVIDER: Donzella Lauraine SAILOR, DO  REFERRING DIAG: chronic midline low back pain with left-sided sciatica  Rationale for Evaluation and Treatment: Rehabilitation  THERAPY DIAG:  Other low back pain  Pain in thoracic spine  Neuralgia and neuritis  Pain in left thigh  Difficulty in walking, not elsewhere classified  ONSET DATE: chronic but current exacerbation 2-3 months prior to initial PT evaluation.   SUBJECTIVE:  PERTINENT HISTORY:  Patient is a 58 y.o. male who presents to outpatient physical therapy with a referral for medical diagnosis chronic midline low back pain with left-sided sciatica. This patient's chief complaints consist of low back pain and left leg pain, leading to the following functional deficits: difficulty with usual activities such as sleeping, rising, walking, ADLs, IADLs, stairs, standing, homemaking, social life, traveling, personal care, lifting, bending, and sitting. 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 he  has a  past medical history of Allergy, Asthma, COPD (chronic obstructive pulmonary disease) (HCC), Depression, Gynecomastia, male (07/21/2022), and Hypertension. he  has a past surgical history that includes Hernia repair.  He just finished treatment for his lung disease. He finished pulmonary rehab back in January. Patient denies hx of cancer, stroke, seizures, heart problems, diabetes, unexplained weight loss, unexplained changes in bowel or bladder problems, unexplained stumbling or dropping things, osteoporosis, and spinal surgery  Exercise history: completed pulmonary rehab in June (did it in 2 months instead of 4 because he went daily).   SUBJECTIVE STATEMENT: Patient reports he is doing well with no pain upon arrival. He has not started back to the gym yet. He missed his PT appointment last week because the PT was out.   Top three challenges: Walking up steps   PAIN: Are you having pain?  NPRS: 0/10  From initial evaluation 09/05/23:  Best: 2/10, Worst: 8/10. Pain location: Central low back and down left leg to knee Pain description: Aches in leg and sharp in back.  Aggravating factors: Laying down at night, going up stairs, bending over the wrong way (that is when he feels the sciatic nerve issue), walking, standing up, pain never goes away.  Easing factors: pillow at back when sitting, elevating legs in chair, being still, hot/cold compress, hot shower, medications (but he thinks he is overdosing himself on the medications, so he thinks his body has gotten used to it and it is not helping any more, he does not take narcotics).   PRECAUTIONS: don't be out in the heat. Avoid extreme cold weather. Has stage 4 COPD and restrictive disease.  PATIENT GOALS: I want to be able to do regular things, walk out and lift things He does not even lift his 13 lb cat He has to get someone to take cases of water up to the apartment. He would like to make it through all of the sessions and make it to a good  outcome.    OBJECTIVE  TREATMENT         Therapeutic activities: dynamic therapeutic activities incorporating MULTIPLE parameters or areas of the body designed to achieve improved functional performance.  Treadmill ambulation. For improved upright mobility, muscular endurance, and activity tolerance; and to induce the analgesic effect of aerobic exercise, stimulate improved joint nutrition, and prepare body structures and systems for following interventions. Also to reinforce understanding of appropriate exercise intensity to help meet physical activity guidelines for health.  Speed: 2.6 mph Incline:  0% UE support: intermittent 7  minutes RPE: 3/10  Air squat 1x10  1x10 with LE rooting  Barbell back squat at rack 1x10 with 23#BB with minor cuing for position and racking/reracking bar.  1x10 with 43# BB  1x10 with 43# BB with rooting  Sled push 2x30 feet with 75# sled plus 50# 4x30 feet with 75# sled plus 140# Patient turning sled  Neuromuscular Re-education: a technique or exercise performed with the goal of improving the level of communication between the body and the brain, such as for balance, motor control, muscle activation patterns, coordination, desensitization, quality of muscle contraction, proprioception, and/or kinesthetic sense needed for successful and safe completion of functional activities.  Standing diaphragmatic breathing Heavy cuing at first Attempted to move to radial breathing but unable Returned to diaphragmatic breathing and added to HEP  Standing LE rooting practice in standing  Pt required multimodal cuing for proper technique and to facilitate improved neuromuscular control, strength, range of motion, and functional ability resulting in improved performance and form.  PATIENT EDUCATION:  Education details: Dentist. Education on diagnosis, prognosis, POC, anatomy and physiology of current condition.  Person educated:  Patient Education method: Explanation Education comprehension: verbalized understanding and needs further education  HOME EXERCISE PROGRAM: Access Code: [email or web address] URL: [email or web address] Date: 11/09/2023 Prepared by: Camie Cleverly  Exercises - Sitting to Supine Roll  - Standing Lumbar Traction with Chair  - Supine Shoulder Flexion with Dowel  - 1 x daily - 3 sets - 10 reps - Quadruped Hip Hike on Foam  - 1 x daily - 1 sets - 10 reps - 5 seconds hold - Quadruped Alternating Arm Lift  - 1 x daily - 1 sets - 10 reps - 5 seconds hold - Beginner Front Arm Support  - 1 x daily - 1 sets - 5 reps - 5 second hold hold - Half Kneeling Hip Flexor Stretch  - 1 x daily - 1 sets - 3 reps - 1 min hold - Standing Diaphragmatic Breathing  - 3 x daily - 10 reps  HOME EXERCISE PROGRAM [[id number]]  Self Lumbar Traction  -   Self-Lumbar Traction  -  Repeat 10 Repetitions, Hold 10 Seconds, Complete 1 Set, Perform 2 Times a Day  HOME EXERCISE PROGRAM [[id number]]  Lock Clams -  Repeat 15 Repetitions, Complete 2 Sets, Perform 1 Times a Day  ASSESSMENT:  CLINICAL IMPRESSION: Continued with focus on functional exercises that patient wants to be able to do at the gym following PT episode of care. Continued working on back squats. Patient showed good carry over and was able to progress load. He had difficulty with keeping B LE aligned with slight right hip shift with R dynamic valgus compared to left. Pt learned LE rooting technique but still had trouble with the R LE position on squat. He may benefit from slow eccentric squat or holds in the bottom of the squat to help improve this. Also worked on core bracing for lifting. Patient was able to progress to diaphragmatic breathing but not to radial breathing. Plan to advance next visit as able. Pt assigned HEP to work on diaphragmatic breathing to make it more automatic. He did get lightheaded with diaphragmatic breathing and needed seated breaks between  sets of exercise at times (especially sled push) due to dyspnea. Patient would benefit from continued management of limiting condition by skilled physical therapist to address remaining impairments and functional limitations to work towards stated goals and return to PLOF or maximal  functional independence.   From initial PT exam on 09/05/2023: Patient is a 58 y.o. male referred to outpatient physical therapy with a medical diagnosis of chronic midline low back pain with left-sided sciatica who presents with signs and symptoms consistent with chronic low back pain with L sided radiculopathy. Patient very irritable today so physical exam limited to his tolerance. He demonstrated decreased myotomes at the L foot (ankle DF, PF, eversion, and great toe extension), decreased achilles DTR (S1), and lower leg/foot sensation on left compared to right. Unable to tolerate nerve tension testing today, but pt has increased pain with knee flexion and is unable to relax quad. He had increased tension muscles of thoracic spine and low back, L lumbar paraspinals and QL the worst. He had decreased mobility to CPA attempt in thoracic spine and was unable to tolerate pressure to assess that in lumbar spine. He had allodynia to light touch over thoracic spine and left lumbar spine. He demonstrated avoidance of weight bearing on L LE which significantly limits his mobility and increases risk for falls. He likely has load and nerve tension sensitivity possibly with multidirectional position sensitivity. Patient presents with significant pain, ROM, nerve tension, joint stiffness, gait, balance, muscle tension, sensation, motor control, knowledge, muscle performance (strength/power/endurance), DTR, myotome, and activity tolerance impairments that are limiting ability to complete his usual activities such as sleeping, rising, walking, ADLs, IADLs, stairs, standing, homemaking, social life, traveling, personal care, lifting, bending, and  sitting without difficulty. Patient will benefit from skilled physical therapy intervention to address current body structure impairments and activity limitations to improve function and work towards goals set in current POC in order to return to prior level of function or maximal functional improvement.    OBJECTIVE IMPAIRMENTS: Abnormal gait, cardiopulmonary status limiting activity, decreased activity tolerance, decreased balance, decreased coordination, decreased endurance, decreased knowledge of condition, decreased knowledge of use of DME, decreased mobility, difficulty walking, decreased ROM, decreased strength, hypomobility, increased muscle spasms, impaired flexibility, impaired sensation, impaired tone, improper body mechanics, and pain.   ACTIVITY LIMITATIONS: carrying, lifting, bending, sitting, standing, squatting, sleeping, stairs, transfers, bed mobility, dressing, hygiene/grooming, and locomotion level  PARTICIPATION LIMITATIONS: meal prep, cleaning, laundry, interpersonal relationship, driving, shopping, community activity, and occupation  PERSONAL FACTORS: Fitness, Past/current experiences, Time since onset of injury/illness/exacerbation, and 3+ comorbidities:  Hypertension; Asthma, allergic, mild persistent, uncomplicated; Major depression, recurrent (HCC); Mixed hyperlipidemia; Chronic midline low back pain with left-sided sciatica; Prediabetes; Nicotine dependence with current use; History of diverticulosis; Paresthesia of right upper extremity; Restrictive lung disease; Mixed restrictive and obstructive lung disease (HCC); Asthma-COPD overlap syndrome (HCC); 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 he  has a past medical history of Allergy, Asthma, COPD (chronic  obstructive pulmonary disease) (HCC), Depression, Gynecomastia, male (07/21/2022), and Hypertension. he  has a past surgical history that includes Hernia repair are also affecting patient's functional outcome.   REHAB POTENTIAL: Good  CLINICAL DECISION MAKING: Evolving/moderate complexity  EVALUATION COMPLEXITY: Moderate   GOALS: Goals reviewed with patient? No  SHORT TERM GOALS: Target date: 09/19/2023  Patient will be independent with initial home exercise program for self-management of symptoms. Baseline: Initial HEP to be provided at visit 2 as appropriate (09/05/23); Goal status: MET  LONG TERM GOALS: Target date: 11/28/2023. Target date updated to 01/18/2024 for all unmet goals on  10/26/23.   Patient will be independent with a long-term home exercise program for self-management of symptoms.  Baseline: Initial HEP to be provided at visit 2 as appropriate (09/05/23); performing them but not every day (10/03/2023); performing intermittently (10/26/2023);  Goal status: In progress  2.  Patient will demonstrate improved Oswestry Disability Index (ODI) to equal or less than 10% to demonstrate improvement in overall condition and self-reported functional ability.  Baseline: 56% (09/05/23); 28% (10/05/2023); 16% (10/26/2023);  Goal status: In progress  3.  Patient will demonstrate full lumbar and LE ROM without increased symptoms beyond mild intermittent end range discomfort to improve his ability to perform ADLs and IADLs.  Baseline: severely limited where measured - see objective (09/05/23); significantly improved but still limited and painful (10/05/2023); much better with mild limitations and pain (10/26/2023);  Goal status: nearly met  4.  Patient will demonstrate B LE strength equal bilaterally or at least 4+/5 to improve his ability to complete ADLs, IADLs, and leisure activities such as daily walking with less difficulty.  Baseline: L ankle/great toe myotomes 3/5 R ankle/great toe  myotomes 4+/5 (09/05/23); significantly improved, now weakest are bilateral great toe extension and L knee flexion 4/5 (10/05/2023);  Goal status: MET (10/26/23)  6.  Patient will report NPRS equal or less than 5/10 during functional activities during the last 2 weeks to improve their abilitly to complete community, work and/or recreational activities with less limitation. Baseline: 8/10 (09/05/23); 7/10 but not daily, uses traction to help (10/03/2023); 5/10 in the last two weeks - Sunday morning when he did not take his pain meds on Saturday night  - thinks highest would by 2/10 otherwise (10/26/2023);  Goal status: MET   PLAN:  PT FREQUENCY: 2x/week  PT DURATION: 12 weeks  PLANNED INTERVENTIONS: 97164- PT Re-evaluation, 97750- Physical Performance Testing, 97110-Therapeutic exercises, 97530- Therapeutic activity, 97112- Neuromuscular re-education, 97535- Self Care, [id number]- Manual therapy, [phone]- Gait training, [phone]- Aquatic Therapy, [phone]- Electrical stimulation (unattended), [phone]- Traction (mechanical), [phone] (1-2 muscles), [id number] (3+ muscles)- Dry Needling, Patient/Family education, Balance training, Stair training, Joint mobilization, Spinal mobilization, DME instructions, Cryotherapy, and Moist heat.  PLAN FOR NEXT SESSION: update HEP as appropriate, interventions for pain relief and to decrease muscle guarding, further assessment as appropriate, education on mechanical stressors and modifications in daily life, progressing to retraining of motor control, rebuilding of strength and functional ability as tolerance improves. Education. Manual therapy.    Camie SAUNDERS. Juli, PT, DPT, Cert. MDT 11/09/23, 6:25 PM  [facility] [address] [place], [place] [address] P: [phone] I F: [phone]

## 2023-11-15 ENCOUNTER — Ambulatory Visit: Payer: MEDICAID | Admitting: Physical Therapy

## 2023-11-23 ENCOUNTER — Encounter: Payer: Self-pay | Admitting: Physical Therapy

## 2023-11-23 ENCOUNTER — Ambulatory Visit: Payer: MEDICAID | Admitting: Physical Therapy

## 2023-11-23 DIAGNOSIS — M792 Neuralgia and neuritis, unspecified: Secondary | ICD-10-CM

## 2023-11-23 DIAGNOSIS — M79652 Pain in left thigh: Secondary | ICD-10-CM

## 2023-11-23 DIAGNOSIS — M546 Pain in thoracic spine: Secondary | ICD-10-CM

## 2023-11-23 DIAGNOSIS — M5459 Other low back pain: Secondary | ICD-10-CM

## 2023-11-23 DIAGNOSIS — R262 Difficulty in walking, not elsewhere classified: Secondary | ICD-10-CM

## 2023-11-23 NOTE — Therapy (Signed)
 OUTPATIENT PHYSICAL THERAPY TREATMENT / DISCHARGE SUMMARY Dates of reporting from 09/05/2023 on 11/23/2023    Patient Name: Larry Maxwell MRN: [id number] DOB:1965/11/03, 58 y.o., male Today's Date: 11/23/2023  END OF SESSION:  PT End of Session - 11/23/23 0911     Visit Number 12    Number of Visits 17    Date for Recertification  01/18/24    Authorization Type [employer] reporting period from 09/05/2023    Authorization Time Period [place] [phone] for 4 PT vsts from 9/22-3/21/26    Authorization - Visit Number 3    Authorization - Number of Visits 4    Progress Note Due on Visit 10    PT Start Time 0904    PT Stop Time 0944    PT Time Calculation (min) 40 min    Activity Tolerance Patient tolerated treatment well    Behavior During Therapy [facility] for tasks assessed/performed             Past Medical History:  Diagnosis Date   Allergy    Asthma    COPD (chronic obstructive pulmonary disease) (HCC)    Depression    Gynecomastia, male 07/21/2022   Hypertension    Past Surgical History:  Procedure Laterality Date   HERNIA REPAIR     Patient Active Problem List   Diagnosis Date Noted   Acute leg pain, left 04/24/2023   Eczema 04/24/2023   Blood glucose elevated 02/02/2023   Epigastric discomfort 02/02/2023   History of pancreatitis 02/02/2023   Chronic kidney disease, stage 3a (HCC) 02/02/2023   Insomnia due to medical condition 12/15/2022   Mixed restrictive and obstructive lung disease (HCC) 10/25/2022   Asthma-COPD overlap syndrome (HCC) 10/25/2022   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) 10/25/2022   Restrictive lung disease 08/25/2022   Paresthesia of right upper extremity 08/17/2022   History of diverticulosis 08/09/2022   Nicotine dependence with current use 08/04/2022   Prediabetes 08/02/2022   Hypertension 07/21/2022   Asthma, allergic, mild persistent, uncomplicated 07/21/2022   Major depression, recurrent  07/21/2022   Mixed hyperlipidemia 07/21/2022   Chronic midline low back pain with left-sided sciatica 07/21/2022    PCP: Donzella Lauraine SAILOR, DO  REFERRING PROVIDER: Donzella Lauraine SAILOR, DO  REFERRING DIAG: chronic midline low back pain with left-sided sciatica  Rationale for Evaluation and Treatment: Rehabilitation  THERAPY DIAG:  Other low back pain  Pain in thoracic spine  Neuralgia and neuritis  Pain in left thigh  Difficulty in walking, not elsewhere classified  ONSET DATE: chronic but current exacerbation 2-3 months prior to initial PT evaluation.   SUBJECTIVE:  PERTINENT HISTORY:  Patient is a 58 y.o. male who presents to outpatient physical therapy with a referral for medical diagnosis chronic midline low back pain with left-sided sciatica. This patient's chief complaints consist of low back pain and left leg pain, leading to the following functional deficits: difficulty with usual activities such as sleeping, rising, walking, ADLs, IADLs, stairs, standing, homemaking, social life, traveling, personal care, lifting, bending, and sitting. 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 he  has a past medical history of Allergy, Asthma, COPD (chronic obstructive pulmonary disease) (HCC), Depression, Gynecomastia, male (07/21/2022), and Hypertension. he  has a past surgical history that includes Hernia repair.  He just finished treatment for his lung disease. He finished pulmonary rehab back in January. Patient denies hx of cancer, stroke, seizures, heart problems, diabetes, unexplained weight loss, unexplained changes in bowel or bladder problems, unexplained stumbling or dropping things, osteoporosis, and spinal surgery  Exercise history: completed pulmonary rehab in June (did it in 2 months instead of 4 because he went daily).   SUBJECTIVE STATEMENT: Patient reports he is stressed and has not been sleeping good since his mother passed last week. He would like to discharge today. He has been doing a lot of walking because he had so much on his mind. He has been walking about 6 miles a day. He feels out of breath that limits his walking. He would like some therabands.   Top three challenges: Walking up steps   PAIN: Are you having pain?  NPRS: 0/10   PRECAUTIONS: don't be out in the heat. Avoid extreme cold weather. Has stage 4 COPD and restrictive disease.  PATIENT GOALS: I want to be able to do regular things, walk out and lift things He does not even lift his 13 lb cat He has to get someone to take cases of water up to the apartment. He would like to make it through all of the sessions and make it to a good outcome.    OBJECTIVE  SELF-REPORTED FUNCTION MODIFIED OSWESTRY DISABILITY SCALE  Date: 11/23/23 Score  Pain intensity 0 = I can tolerate the pain I have without having to use pain medication.  2. Personal care (washing, dressing, etc.) 0 =  I can take care of myself normally without causing increased pain.  3. Lifting 0 = I can lift heavy weights without increased pain.  4. Walking 0 = Pain does not  prevent me from walking any distance  5. Sitting 1 =  I can only sit in my favorite chair as long as I like.  6. Standing 0 =  I can stand as long as I want without increased pain.  7. Sleeping 1 = I can sleep well only by using pain medication.  8. Social Life 1 =  My social life is normal, but it increases my level of pain.  9. Traveling 1 =  I can travel anywhere, but it increases my pain.  10. Employment/ Homemaking 1 =  My normal homemaking/job activities increase my pain, but I can still perform all that is required of me  Total 5/50 (10%)   Minimally Clinically Important Difference (MCID) = 12.8%    STANDING Lumbar AROM (instructed to stop when pain increases) Flexion: fingers ankles Extension: 25% Rotation:  R: 50%  L: 50% Side Flexion:  R: 40%  L:  30%    SUPINE   LE PROM  B hip and knee PROM fairly equal but with end range stretch/discomfort.   TREATMENT         Therapeutic activities: dynamic therapeutic activities incorporating MULTIPLE parameters or areas of the body designed to achieve improved functional performance.  Treadmill ambulation. For improved upright mobility, muscular endurance, and activity tolerance; and to induce the analgesic effect of aerobic exercise, stimulate improved joint nutrition, and prepare body structures and systems for following interventions. Also to reinforce understanding of appropriate exercise intensity to help meet physical activity guidelines for health.  Speed: 2.6 mph Incline: 0% UE support: intermittent 5  minutes RPE: 3/10  Air squat 1x10  1x10 with LE rooting  Barbell back squat at rack with rooting 1x10 with 45#BB with minor cuing 1x10 with 65# BB  1x10 with 85# BB 1x10 with 105# BB with rooting  Therapeutic exercise: therapeutic exercises that incorporate ONE parameter at one or more areas of the body to centralize symptoms, develop strength and endurance, range of motion, and flexibility required for successful  completion of functional activities.  Lumbar AROM and hip/knee PROM assessment to determine readiness for discharge (see above)  Discharge recommendations. Provided black and silver theraband for home use.   Pt required multimodal cuing for proper technique and to facilitate improved neuromuscular control, strength, range of motion, and functional ability resulting in improved performance and form.  PATIENT EDUCATION:  Education details: Dentist. Discharge reccomendations Person educated: Patient Education method: Explanation and Demonstration Education comprehension: verbalized understanding and returned demonstration  HOME EXERCISE PROGRAM: Access Code: [email or web address] URL: [email or web address] Date: 11/23/2023 Prepared by: Camie Cleverly  Exercises - Sitting to Supine Roll  - Standing Lumbar Traction with Chair  - 2 x daily - 1 sets - 10 reps - 10 seconds hold - Supine Shoulder Flexion with Dowel  - 1 x daily - 3 sets - 10 reps - Quadruped Hip Hike on Foam  - 1 x daily - 1 sets - 10 reps - 5 seconds hold - Quadruped Alternating Arm Lift  - 1 x daily - 1 sets - 10 reps - 5 seconds hold - Beginner Front Arm Support  - 1 x daily - 1 sets - 5 reps - 5 second hold hold - Half Kneeling Hip Flexor Stretch  - 1 x daily - 1 sets - 3 reps - 1 min hold - Standing Diaphragmatic Breathing  - 3 x daily - 10 reps - Lock Clams  - 1 x daily - 2 sets - 15 reps  HOME EXERCISE PROGRAM [[id number]]  Self Lumbar Traction  -   Self-Lumbar Traction  -  Repeat 10 Repetitions, Hold 10 Seconds, Complete 1 Set, Perform 2 Times a Day  HOME EXERCISE PROGRAM [[id number]]  Lock Clams -  Repeat 15 Repetitions, Complete 2 Sets, Perform 1 Times a Day  ASSESSMENT:  CLINICAL IMPRESSION: Patient has attended 12 physical therapy sessions since starting current episode of care on 09/05/2023. He has made excellent progress, meeting all of his short and long term goals and working towards  performing exercises he plans to continue  at the gym. Today he was able to perform up to 10 barbell back squats with good form with a 105# barbell. He is now discharged from PT due to improvement in condition.    OBJECTIVE IMPAIRMENTS: Abnormal gait, cardiopulmonary status limiting activity, decreased activity tolerance, decreased balance, decreased coordination, decreased endurance, decreased knowledge of condition, decreased knowledge of use of DME, decreased mobility, difficulty walking, decreased ROM, decreased strength, hypomobility, increased muscle spasms, impaired flexibility, impaired sensation, impaired tone, improper body mechanics, and pain.   ACTIVITY LIMITATIONS: carrying, lifting, bending, sitting, standing, squatting, sleeping, stairs, transfers, bed mobility, dressing, hygiene/grooming, and locomotion level  PARTICIPATION LIMITATIONS: meal prep, cleaning, laundry, interpersonal relationship, driving, shopping, community activity, and occupation  PERSONAL FACTORS: Fitness, Past/current experiences, Time since onset of injury/illness/exacerbation, and 3+ comorbidities:  Hypertension; Asthma, allergic, mild persistent, uncomplicated; Major depression, recurrent (HCC); Mixed hyperlipidemia; Chronic midline low back pain with left-sided sciatica; Prediabetes; Nicotine dependence with current use; History of diverticulosis; Paresthesia of right upper extremity; Restrictive lung disease; Mixed restrictive and obstructive lung disease (HCC); Asthma-COPD overlap syndrome (HCC); 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 he  has a past medical history of Allergy, Asthma, COPD (chronic obstructive pulmonary disease) (HCC), Depression, Gynecomastia, male (07/21/2022), and Hypertension. he   has a past surgical history that includes Hernia repair are also affecting patient's functional outcome.   REHAB POTENTIAL: Good  CLINICAL DECISION MAKING: Evolving/moderate complexity  EVALUATION COMPLEXITY: Moderate   GOALS: Goals reviewed with patient? No  SHORT TERM GOALS: Target date: 09/19/2023  Patient will be independent with initial home exercise program for self-management of symptoms. Baseline: Initial HEP to be provided at visit 2 as appropriate (09/05/23); Goal status: MET  LONG TERM GOALS: Target date: 11/28/2023. Target date updated to 01/18/2024 for all unmet goals on 10/26/23.   Patient will be independent with a long-term home exercise program for self-management of symptoms.  Baseline: Initial HEP to be provided at visit 2 as appropriate (09/05/23); performing them but not every day (10/03/2023); performing intermittently (10/26/2023);  Goal status: MET  2.  Patient will demonstrate improved Oswestry Disability Index (ODI) to equal or less than 10% to demonstrate improvement in overall condition and self-reported functional ability.  Baseline: 56% (09/05/23); 28% (10/05/2023); 16% (10/26/2023); 10% (11/23/2023);  Goal status: MET  3.  Patient will demonstrate full lumbar and LE ROM without increased symptoms beyond mild intermittent end range discomfort to improve his ability to perform ADLs and IADLs.  Baseline: severely limited where measured - see objective (09/05/23); significantly improved but still limited and painful (10/05/2023); much better with mild limitations and pain (10/26/2023);  Goal status: MET  4.  Patient will demonstrate B LE strength equal bilaterally or at least 4+/5 to improve his ability to complete ADLs, IADLs, and leisure activities such as daily walking with less difficulty.  Baseline: L ankle/great toe myotomes 3/5 R ankle/great toe myotomes 4+/5 (09/05/23); significantly improved, now weakest are bilateral great toe extension and L knee flexion  4/5 (10/05/2023);  Goal status: MET (10/26/23)  6.  Patient will report NPRS equal or less than 5/10 during functional activities during the last 2 weeks to improve their abilitly to complete community, work and/or recreational activities with less limitation. Baseline: 8/10 (09/05/23); 7/10 but not daily, uses traction to help (10/03/2023); 5/10 in the last two  weeks - Sunday morning when he did not take his pain meds on Saturday night  - thinks highest would by 2/10 otherwise (10/26/2023);  Goal status: MET   PLAN:  PT FREQUENCY: 2x/week  PT DURATION: 12 weeks  PLANNED INTERVENTIONS: 97164- PT Re-evaluation, 97750- Physical Performance Testing, 97110-Therapeutic exercises, 97530- Therapeutic activity, 97112- Neuromuscular re-education, 97535- Self Care, 02859- Manual therapy, [phone]- Gait training, [phone]- Aquatic Therapy, [phone]- Electrical stimulation (unattended), [phone]- Traction (mechanical), [phone] (1-2 muscles), 20561 (3+ muscles)- Dry Needling, Patient/Family education, Balance training, Stair training, Joint mobilization, Spinal mobilization, DME instructions, Cryotherapy, and Moist heat.  PLAN FOR NEXT SESSION: patient is now discharged to independent management with long term HEP.    Camie SAUNDERS. Juli, PT, DPT, Cert. MDT 11/23/23, 10:45 PM  [facility] [address] [place], [place] [address] P: [phone] I F: [phone]

## 2023-11-29 ENCOUNTER — Ambulatory Visit: Payer: MEDICAID | Admitting: Family Medicine

## 2023-11-30 ENCOUNTER — Ambulatory Visit (INDEPENDENT_AMBULATORY_CARE_PROVIDER_SITE_OTHER): Payer: MEDICAID | Admitting: Clinical

## 2023-11-30 DIAGNOSIS — F325 Major depressive disorder, single episode, in full remission: Secondary | ICD-10-CM

## 2023-11-30 NOTE — Progress Notes (Signed)
 [facility] Counselor/Therapist Progress Note  Patient ID: TAYVIEN KANE, MRN: [id number],    Date: 11/30/2023  Time Spent: 8:37am - 9:21am : 44 minutes   Treatment Type: Individual Therapy  Reported Symptoms: interrupted sleep, dreams of falling  Mental Status Exam: Appearance:  Neat and Well Groomed     Behavior: Appropriate  Motor: Normal  Speech/Language:  Clear and Coherent and Normal Rate  Affect: Appropriate  Mood: normal  Thought process: normal  Thought content:   WNL  Sensory/Perceptual disturbances:   WNL  Orientation: oriented to person, place, time/date, and situation  Attention: Good  Concentration: Good  Memory: WNL  Fund of knowledge:  Good  Insight:   Good  Judgment:  Good  Impulse Control: Good   Risk Assessment: Danger to Self:  No Patient denied current suicidal ideation  Self-injurious Behavior: No Danger to Others: NoPatient denied current homicidal ideation Duty to Warn:no Physical Aggression / Violence:No  Access to Firearms a concern: No  Gang Involvement:No   Subjective:  Patient stated, pretty good in response to events since last session. Patient stated, I haven't been sleeping good for the past couple of weeks, for about two and a half weeks now. Patient reported patient has been experiencing dreams of patient falling and patient reported waking up sweating. Patient stated, I try to go bed with a clear mind.  Patient stated, I haven't been having negative thoughts about nothing. Patient reported patient has experienced worry related to obtaining food. Patient stated, my mood's been good.  Patient reported experiencing vivid dreams if patient forgets to take Zoloft . Patient stated, its kind of scary in reference to dreams. Patient reported experiencing side effects (constipation) when taking Zoloft . Patient reported waking up at 2:30/3 am and reported going to bed at 7/7:30 pm. Patient reported feeling tired at  approximately 10 am and returns to sleep. Patient reported when returning to sleep patient may stay in bed for the duration of the day. Patient reported thoughts of how am I going to make ends meet. Patient reported patient feels he does not receive emotional support when discussing concerns with family and stated, that hurts. Patient reported a limited supply of food for the last three weeks.    Interventions: Cognitive Behavioral Therapy and task centered. Clinician conducted session in person at clinician's office at [facility]. Reviewed events since last session and assessed for changes. Discussed patient's report of difficulty sleeping. Explored triggers for decline in sleep. Reviewed patient's sleep routine. Explored thoughts and feelings associated with staying in bed. Discussed initiating a conversation with PCP to review patient's concerns related to medication and sleep. Discussed and provided information for local food and utility resources. Clinician requested for homework patient complete a sleep diary.   Collaboration of Care: Other not required at this time   Diagnosis:  Major depressive disorder, single episode, in full remission     Plan: Patient is to utilize Cognitive Behavioral Therapy, thought re-framing, mindfulness, positive activities, and coping strategies to maintain stability in mood  Frequency: monthly  Modality: individual      Long-term goal:   Maintain stability in mood 7 days per week per patient's report Target Date: 09/29/24  Progress: established 09/30/23    Short-term goal:  Identify and implement strategies to prevent relapse in mood Target Date: 07/05/24  Progress: established 09/30/23    Practice coping strategies daily to maintain stability in mood Target Date: 07/05/24  Progress: established 09/30/23    Maintain participation in enjoyable  activities 7 days per week Target Date: 07/05/24  Progress: established 09/30/23    patient will verbalize  positive statements regarding self and his ability to cope with life stressors Target Date: 07/06/23  Progress: as of 09/30/23 patient reported patient feels this goal has been met    Verbalize patient's thoughts and feelings to through effective communication strategies per patient's report Target Date: 07/06/23  Progress: as of 09/30/23 patient reported patient feels this goal has been met    Identify, challenge, and replace negative thought patterns and negative self talk that contribute to feelings of depression with positive thoughts, beliefs, and positive self talk per patient's report Target Date: 07/06/23  Progress: as of 09/30/23 patient reported patient feels this goal has been met       Darice Seats, LCSW

## 2023-11-30 NOTE — Progress Notes (Signed)
   Darice Seats, LCSW

## 2023-12-07 ENCOUNTER — Encounter: Payer: Self-pay | Admitting: Family Medicine

## 2023-12-07 ENCOUNTER — Other Ambulatory Visit: Payer: Self-pay

## 2023-12-07 ENCOUNTER — Ambulatory Visit (INDEPENDENT_AMBULATORY_CARE_PROVIDER_SITE_OTHER): Payer: MEDICAID | Admitting: Family Medicine

## 2023-12-07 VITALS — BP 108/72 | HR 77 | Temp 98.0°F | Ht 73.0 in | Wt 202.4 lb

## 2023-12-07 DIAGNOSIS — N1831 Chronic kidney disease, stage 3a: Secondary | ICD-10-CM

## 2023-12-07 DIAGNOSIS — F330 Major depressive disorder, recurrent, mild: Secondary | ICD-10-CM

## 2023-12-07 DIAGNOSIS — E782 Mixed hyperlipidemia: Secondary | ICD-10-CM

## 2023-12-07 DIAGNOSIS — Z23 Encounter for immunization: Secondary | ICD-10-CM

## 2023-12-07 DIAGNOSIS — F411 Generalized anxiety disorder: Secondary | ICD-10-CM

## 2023-12-07 DIAGNOSIS — G8929 Other chronic pain: Secondary | ICD-10-CM

## 2023-12-07 DIAGNOSIS — R7303 Prediabetes: Secondary | ICD-10-CM

## 2023-12-07 DIAGNOSIS — Z1159 Encounter for screening for other viral diseases: Secondary | ICD-10-CM

## 2023-12-07 DIAGNOSIS — I10 Essential (primary) hypertension: Secondary | ICD-10-CM

## 2023-12-07 DIAGNOSIS — Z9181 History of falling: Secondary | ICD-10-CM | POA: Diagnosis not present

## 2023-12-07 DIAGNOSIS — I7121 Aneurysm of the ascending aorta, without rupture: Secondary | ICD-10-CM

## 2023-12-07 DIAGNOSIS — R202 Paresthesia of skin: Secondary | ICD-10-CM

## 2023-12-07 DIAGNOSIS — M545 Low back pain, unspecified: Secondary | ICD-10-CM | POA: Diagnosis not present

## 2023-12-07 MED ORDER — AMLODIPINE BESYLATE 10 MG PO TABS
10.0000 mg | ORAL_TABLET | Freq: Every day | ORAL | 1 refills | Status: AC
  Filled 2023-12-07 – 2024-01-11 (×2): qty 90, 90d supply, fill #0
  Filled 2024-02-12: qty 90, 90d supply, fill #1

## 2023-12-07 MED ORDER — METHOCARBAMOL 500 MG PO TABS
500.0000 mg | ORAL_TABLET | Freq: Four times a day (QID) | ORAL | 1 refills | Status: AC | PRN
  Filled 2023-12-07 – 2024-02-03 (×2): qty 20, 5d supply, fill #0

## 2023-12-07 MED ORDER — SERTRALINE HCL 100 MG PO TABS
100.0000 mg | ORAL_TABLET | Freq: Every day | ORAL | 3 refills | Status: AC
  Filled 2023-12-07 – 2024-02-03 (×2): qty 30, 30d supply, fill #0
  Filled 2024-02-12: qty 30, 30d supply, fill #1

## 2023-12-07 MED ORDER — CELECOXIB 100 MG PO CAPS
100.0000 mg | ORAL_CAPSULE | Freq: Two times a day (BID) | ORAL | 0 refills | Status: AC
  Filled 2023-12-07 – 2024-02-03 (×2): qty 180, 90d supply, fill #0

## 2023-12-07 MED ORDER — GABAPENTIN 600 MG PO TABS
600.0000 mg | ORAL_TABLET | Freq: Three times a day (TID) | ORAL | 0 refills | Status: AC
  Filled 2023-12-07 – 2024-02-03 (×3): qty 270, 90d supply, fill #0

## 2023-12-07 NOTE — Patient Instructions (Addendum)
 Recommended vaccine to get at pharmacy: RSV and covid booster

## 2023-12-07 NOTE — Progress Notes (Signed)
 Established patient visit   Patient: Larry Maxwell   DOB: 07/29/1965   58 y.o. Male  MRN: [id number] Visit Date: 12/07/2023  Today's healthcare provider: LAURAINE LOISE BUOY, DO   Chief Complaint  Patient presents with   Follow-up    Patient is here today due to a 1 month due to anxiety, states that he was put on Zoloft  last visit.  Reports that he is having nightmares and night sweats.  States since being put on Zoloft  he is still on edge and worry about a lot of things.  States that his back is aching really bad, just got done PT.  Hepatitis B Vaccine-yes   Subjective    HPI Larry Maxwell is a 58 year old male with chronic back pain and depression who presents for follow-up and management of his symptoms.  He has persistent lower back pain, described as severe, which worsened after a fall on wet leaves approximately one and a half weeks ago. Despite completing physical therapy, the fall exacerbated his pain. He takes gabapentin  and Celebrex  as prescribed but is uncertain of their effectiveness. Methocarbamol  is used as needed, with a prescription of twenty tablets at a time. Physical therapy provided some relief, but he experienced an asthma attack during one session due to the heat. Previous treatments, including epidural steroid injections and shot therapy, offered limited relief, and he is hesitant about surgical intervention.  He experiences vivid dreams and episodes of waking up sweating and breathless for the past three to four months, which he associates with sertraline  use. Although he has been on sertraline  for a longer period, these symptoms started more recently. He discusses these episodes with his therapist and is unsure if they are related to his medication regimen, which includes gabapentin  and Zoloft  taken at night. His mood is depressed most of the time, with mixed emotions and a lack of motivation to engage in activities. Energy levels fluctuate, sometimes leading to  social withdrawal.  He manages COPD with Advair , Incruse Ellipta , and albuterol , using a nebulizer as needed. He recently ran out of Advair . He takes his medications regularly, including blood pressure medications, and keeps albuterol  with him at all times. No recent chest pain or palpitations. He has a history of asthma and experienced an asthma attack during physical therapy.      Medications: Outpatient Medications Prior to Visit  Medication Sig   albuterol  (VENTOLIN  HFA) 108 (90 Base) MCG/ACT inhaler Inhale 2 puffs into the lungs every 6 (six) hours as needed for wheezing or shortness of breath.   benzonatate  (TESSALON ) 100 MG capsule Take 1 capsule (100 mg total) by mouth 2 (two) times daily as needed for cough.   chlorthalidone  (HYGROTON ) 25 MG tablet Take 1 tablet (25 mg total) by mouth daily.   fluticasone -salmeterol (ADVAIR  HFA) 230-21 MCG/ACT inhaler Inhale 2 puffs into the lungs 2 (two) times daily.   ipratropium-albuterol  (DUONEB) 0.5-2.5 (3) MG/3ML SOLN Take 3 mLs by nebulization every 6 (six) hours as needed.   loratadine  (CLARITIN ) 10 MG tablet Take 1 tablet (10 mg total) by mouth daily.   montelukast  (SINGULAIR ) 10 MG tablet Take 1 tablet (10 mg total) by mouth at bedtime.   naloxone (NARCAN) nasal spray 4 mg/0.1 mL Place into the nose.   omeprazole  (PRILOSEC) 20 MG capsule Take 1 capsule (20 mg total) by mouth daily.   rosuvastatin  (CRESTOR ) 10 MG tablet Take 1 tablet (10 mg total) by mouth daily.   Spacer/Aero-Holding Raguel (  AEROCHAMBER MINI CHAMBER) DEVI Use as directed   umeclidinium bromide  (INCRUSE ELLIPTA ) 62.5 MCG/ACT AEPB Inhale 1 puff into the lungs daily.   valsartan  (DIOVAN ) 80 MG tablet Take 1 tablet (80 mg total) by mouth daily.   [DISCONTINUED] amLODipine  (NORVASC ) 10 MG tablet Take 1 tablet (10 mg total) by mouth daily for 30 doses.   [DISCONTINUED] celecoxib  (CELEBREX ) 100 MG capsule Take 1 capsule (100 mg total) by mouth 2 (two) times daily.    [DISCONTINUED] gabapentin  (NEURONTIN ) 300 MG capsule Take 1 capsule (300 mg total) by mouth in the morning AND 1 capsule (300 mg total) daily AND 2 capsules (600 mg total) at bedtime.   [DISCONTINUED] methocarbamol  (ROBAXIN ) 500 MG tablet Take 1 tablet (500 mg total) by mouth every 6 (six) hours as needed for muscle spasms.   [DISCONTINUED] sertraline  (ZOLOFT ) 50 MG tablet Take 1 tablet (50 mg total) by mouth daily.   No facility-administered medications prior to visit.        Objective    BP 108/72 (BP Location: Left Arm, Patient Position: Sitting, Cuff Size: Normal)   Pulse 77   Temp 98 F (36.7 C) (Oral)   Ht 6' 1 (1.854 m)   Wt 202 lb 6.4 oz (91.8 kg)   SpO2 100%   BMI 26.70 kg/m     Physical Exam Vitals reviewed.  Constitutional:      General: He is not in acute distress.    Appearance: Normal appearance. He is not diaphoretic.  HENT:     Head: Normocephalic and atraumatic.  Eyes:     General: No scleral icterus.    Conjunctiva/sclera: Conjunctivae normal.  Cardiovascular:     Rate and Rhythm: Normal rate and regular rhythm.     Pulses: Normal pulses.     Heart sounds: Normal heart sounds. No murmur heard. Pulmonary:     Effort: Pulmonary effort is normal. No respiratory distress.     Breath sounds: Normal breath sounds. No wheezing or rhonchi.  Musculoskeletal:     Cervical back: Neck supple.     Right lower leg: No edema.     Left lower leg: No edema.  Lymphadenopathy:     Cervical: No cervical adenopathy.  Skin:    General: Skin is warm and dry.     Findings: No rash.  Neurological:     Mental Status: He is alert and oriented to person, place, and time. Mental status is at baseline.  Psychiatric:        Mood and Affect: Mood normal.        Behavior: Behavior normal.      No results found for any visits on 12/07/23.  Assessment & Plan    Status post fall  Chronic midline low back pain without sciatica -     Ambulatory referral to Pain Clinic -      Ambulatory referral to Orthopedics -     Celecoxib ; Take 1 capsule (100 mg total) by mouth 2 (two) times daily.  Dispense: 180 capsule; Refill: 0 -     Methocarbamol ; Take 1 tablet (500 mg total) by mouth every 6 (six) hours as needed for muscle spasms.  Dispense: 20 tablet; Refill: 1 -     Gabapentin ; Take 1 tablet (600 mg total) by mouth 3 (three) times daily.  Dispense: 270 tablet; Refill: 0  Mild episode of recurrent major depressive disorder -     Sertraline  HCl; Take 1 tablet (100 mg total) by mouth daily.  Dispense: 30 tablet;  Refill: 3  Generalized anxiety disorder -     Sertraline  HCl; Take 1 tablet (100 mg total) by mouth daily.  Dispense: 30 tablet; Refill: 3  Primary hypertension -     Comprehensive metabolic panel with GFR -     Microalbumin / creatinine urine ratio -     amLODIPine  Besylate; Take 1 tablet (10 mg total) by mouth daily for 30 doses.  Dispense: 90 tablet; Refill: 1  Mixed hyperlipidemia -     Lipid panel  Chronic kidney disease, stage 3a (HCC) -     Microalbumin / creatinine urine ratio  Aneurysm of ascending aorta without rupture -     CT Angio Chest Pulmonary Embolism (PE) W or WO Contrast; Future  Prediabetes -     Hemoglobin A1c  Need for hepatitis B booster vaccination -     Heplisav-B  (HepB-CPG) Vaccine     Status post fall; chronic midline low back pain without sciatica Pain persists despite previous physical therapy. Prefers non-surgical options. Current pain management with gabapentin  and Celebrex  is ongoing but effectiveness is uncertain. - Referred to orthopedics for evaluation and recommendations. - Referred to pain management for epidural steroid injections consideration. - Increased gabapentin  dosage. - Continue Celebrex  and methocarbamol  as needed.  Depression and generalized anxiety disorder Symptoms exacerbated with vivid dreams and sweating. Current sertraline  may not be fully effective. Open to dosage increase. - Increased  sertraline  dosage.  Chronic obstructive pulmonary disease (COPD) Managed with Advair , Incruse Ellipta , and albuterol . Advised RSV vaccine due to increased risk. - Ensured refills for Advair  and Incruse Ellipta . - Advised obtaining RSV vaccine at the pharmacy.  Primary hypertension Chronic, stable. - Continue valsartan  80 mg daily, chlorthalidone  25 mg daily, and amlodipine  10 mg daily  Mixed hyperlipidemia Chronic, managed with rosuvastatin  10 mg daily.  Recheck lipid panel today.  No changes today.  Chronic kidney disease, stage 3a Noted.  Continue to optimize risk factors.  Check uACR today.  Aneurysm of ascending aorta without rupture Noted on previous imaging.  Due for annual imaging follow-up; will recheck today with CT angio chest as noted.  General Health Maintenance Up to date on Hepatitis B vaccination. RSV and COVID booster recommended. - Advised obtaining RSV vaccine at the pharmacy. - Advised obtaining COVID booster at the pharmacy.    Return in about 3 months (around 03/08/2024) for Chronic f/u.      I discussed the assessment and treatment plan with the patient  The patient was provided an opportunity to ask questions and all were answered. The patient agreed with the plan and demonstrated an understanding of the instructions.   The patient was advised to call back or seek an in-person evaluation if the symptoms worsen or if the condition fails to improve as anticipated.    LAURAINE LOISE BUOY, DO  [facility] [phone] (phone) [phone] (fax)  [facility]

## 2023-12-09 ENCOUNTER — Other Ambulatory Visit: Payer: MEDICAID

## 2023-12-09 NOTE — Patient Outreach (Signed)
 Referral received from Pardue, Sarah N, DO for assistance with care management needs. This patient is enrolled in (name of health plan) and has associated care management benefits. I reached out to the health plan today to refer the patient to the assigned health plan care management team member.   Interventions:  Larry Maxwell was advised to anticipate contact from the health plan for follow up about care management services and resources.  Patient did receive resources sw sent. Patient is connected with [facility].  Thersia Hoar, HEDWIG, MHA [place]  [facility] Social Worker, Population Health [phone]

## 2023-12-09 NOTE — Patient Instructions (Signed)
 Visit Information  Thank you for taking time to visit with me today.   Tailored Plan Medicaid On July 26, 2022 some people on [place] Medicaid will move to a new kind of Medicaid health plan called a Tailored Plan. Tailored Plans cover your doctor visits, prescription drugs, and health care services.    If your [place] Medicaid will move to a Tailored Plan, you should have gotten a letter and welcome packet. If you're not sure, call your [place] Medicaid Enrollment Broker at [phone] and ask.  Check out these free materials, in Spanish and English, to learn more about your Tailored Plan: [email or web address]/Tailored-Plans/Toolkit  Tailored Care Management Services  TCM services are available to you now. If you are a Tailored Plan member or will be and want information about Tailored Care Management Services including rides to appointments and community and home services, call the Care Management provider for your county of residence:    [facility] [place], [place])  Member Services: [phone] Behavioral Health Crisis Line: [phone], [place], [place], [place], [place])  Member Services: [phone] Behavioral Health Crisis Line: 9158519204     Please call the Suicide and Crisis Lifeline: 988 call the [place]  National Suicide Prevention Lifeline: (704)581-5582 or TTY: (360)559-0160 TTY (641)189-4627) to talk to a trained counselor call 1-800-273-TALK (toll free, 24 hour hotline) call 911 if you are experiencing a Mental Health or Behavioral Health Crisis or need someone to talk to.  Care plan and visit instructions communicated with the patient verbally today. Patient agrees to receive a copy in MyChart. Active MyChart status and patient understanding of how to access instructions and care plan via MyChart confirmed with patient.     Thersia Hoar, HEDWIG, MHA [place]  [facility] Social Worker, Population Health [phone]

## 2023-12-11 ENCOUNTER — Other Ambulatory Visit (HOSPITAL_COMMUNITY): Payer: Self-pay

## 2023-12-12 ENCOUNTER — Other Ambulatory Visit: Payer: Self-pay

## 2023-12-13 ENCOUNTER — Ambulatory Visit
Admission: RE | Admit: 2023-12-13 | Discharge: 2023-12-13 | Disposition: A | Discharge status: Home / Self Care | Payer: MEDICAID | Source: Ambulatory Visit | Attending: Family Medicine | Admitting: Family Medicine

## 2023-12-13 DIAGNOSIS — I7121 Aneurysm of the ascending aorta, without rupture: Secondary | ICD-10-CM | POA: Diagnosis present

## 2023-12-13 MED ORDER — IOHEXOL 350 MG/ML SOLN
75.0000 mL | Freq: Once | INTRAVENOUS | Status: AC | PRN
  Administered 2023-12-13: 75 mL via INTRAVENOUS

## 2023-12-19 ENCOUNTER — Other Ambulatory Visit: Payer: Self-pay

## 2023-12-21 ENCOUNTER — Encounter: Payer: Self-pay | Admitting: Family Medicine

## 2023-12-21 ENCOUNTER — Ambulatory Visit: Payer: Self-pay | Admitting: Family Medicine

## 2024-01-11 ENCOUNTER — Other Ambulatory Visit (HOSPITAL_COMMUNITY): Payer: Self-pay

## 2024-01-11 ENCOUNTER — Other Ambulatory Visit: Payer: Self-pay

## 2024-01-11 ENCOUNTER — Telehealth: Payer: Self-pay | Admitting: Family Medicine

## 2024-01-11 ENCOUNTER — Other Ambulatory Visit: Payer: Self-pay | Admitting: Family Medicine

## 2024-01-11 ENCOUNTER — Ambulatory Visit: Payer: MEDICAID | Admitting: Clinical

## 2024-01-11 DIAGNOSIS — I10 Essential (primary) hypertension: Secondary | ICD-10-CM

## 2024-01-11 DIAGNOSIS — F325 Major depressive disorder, single episode, in full remission: Secondary | ICD-10-CM

## 2024-01-11 MED ORDER — VALSARTAN 80 MG PO TABS
80.0000 mg | ORAL_TABLET | Freq: Every day | ORAL | 3 refills | Status: AC
  Filled 2024-01-11 – 2024-02-12 (×2): qty 90, 90d supply, fill #0

## 2024-01-11 NOTE — Progress Notes (Signed)
 [facility] Counselor/Therapist Progress Note  Patient ID: Larry Maxwell, MRN: [id number],    Date: 01/11/2024  Time Spent: 8:35am - 8:45am : 10 minutes   Treatment Type: video call  Reported Symptoms: depressed mood for 1 month  Mental Status Exam: Appearance:  Neat and Well Groomed     Behavior: Appropriate  Motor: Normal  Speech/Language:  Clear and Coherent and Normal Rate  Affect: Appropriate  Mood: Patient reported depressed mood for 1 month  Thought process: normal  Thought content:   WNL  Sensory/Perceptual disturbances:   WNL  Orientation: oriented to person, place, time/date, and situation  Attention: Good  Concentration: Good  Memory: WNL  Fund of knowledge:  Good  Insight:   Good  Judgment:  Good  Impulse Control: Good   Risk Assessment: Danger to Self:  No no SI/HI reported during video call Self-injurious Behavior: No Danger to Others: No Duty to Warn:no Physical Aggression / Violence:No  Access to Firearms a concern: No  Gang Involvement:No   Subjective: Patient reported patient is currently visiting daughter in [place] . Patient reported depressed mood for 1 month and requested a referral to a psychiatrist due to depressive symptoms prior to ending video call.    Interventions: Clinician spoke with patient via caregility video from clinician's office at [facility]. Patient provided verbal consent to proceed with telehealth session and is aware of limitations of telephone or video visits. Patient participated in video call from patient's daughter's home in [place] . Clinician discussed requirement for patient to be located in   for clinician to be able to provide therapy services and assisted patient in rescheduling appointment to January 23, 2024 at 10:30am.    Collaboration of Care: discussed consent required for referral to psychiatrist   Diagnosis:  Major depressive disorder, single episode, in full remission      Plan: Patient is to utilize [employer] Therapy, thought re-framing, mindfulness, positive activities, and coping strategies to maintain stability in mood  Frequency: monthly  Modality: individual      Long-term goal:   Maintain stability in mood 7 days per week per patient's report Target Date: 09/29/24  Progress: progressing    Short-term goal:  Identify and implement strategies to prevent relapse in mood Target Date: 07/05/24  Progress: progressing    Practice coping strategies daily to maintain stability in mood Target Date: 07/05/24  Progress: progressing    Maintain participation in enjoyable activities 7 days per week Target Date: 07/05/24  Progress: progressing    patient will verbalize positive statements regarding self and his ability to cope with life stressors Target Date: 07/06/23  Progress: as of 09/30/23 patient reported patient feels this goal has been met    Verbalize patient's thoughts and feelings to through effective communication strategies per patient's report Target Date: 07/06/23  Progress: as of 09/30/23 patient reported patient feels this goal has been met    Identify, challenge, and replace negative thought patterns and negative self talk that contribute to feelings of depression with positive thoughts, beliefs, and positive self talk per patient's report Target Date: 07/06/23  Progress: as of 09/30/23 patient reported patient feels this goal has been met    Darice Seats, LCSW

## 2024-01-11 NOTE — Progress Notes (Signed)
   Darice Seats, LCSW

## 2024-01-11 NOTE — Telephone Encounter (Signed)
 1- Patient wants to get an order for MRI of back due severe back pain 2 - needs an order for a cane.  Patient will come back and pick up RX.    Call patient when the order is ready.  [phone]

## 2024-01-12 ENCOUNTER — Ambulatory Visit: Payer: Self-pay

## 2024-01-12 ENCOUNTER — Other Ambulatory Visit: Payer: Self-pay

## 2024-01-12 NOTE — Telephone Encounter (Signed)
 FYI Only or Action Required?: Action required by provider: update on patient condition.  Patient was last seen in primary care on 12/07/2023 by Larry Lauraine SAILOR, DO.  Called Nurse Triage reporting Back Pain.  Symptoms began several years ago.  Interventions attempted: Prescription medications: gabapentin , celebrex , robaxin .  Symptoms are: gradually worsening.  Triage Disposition: See HCP Within 4 Hours (Or PCP Triage)  Patient/caregiver understands and will follow disposition?: Yes          Copied from CRM #[id number]. Topic: Clinical - Red Word Triage >> Jan 12, 2024  8:49 AM Larry Maxwell wrote: [facility] that prompted transfer to Nurse Triage: back is in extreme pain.. radiating down left leg.. barely gets out of bed Reason for Disposition  [1] SEVERE back pain (e.Maxwell., excruciating, unable to do any normal activities) AND [2] not improved 2 hours after pain medicine  Answer Assessment - Initial Assessment Questions 1. ONSET: When did the pain begin? (e.Maxwell., minutes, hours, days)     Chronic, states he had an accident a couple of years ago. When asked about current flare up he states over a year.  2. LOCATION: Where does it hurt? (upper, mid or lower back)     Lower.  3. SEVERITY: How bad is the pain?  (e.Maxwell., Scale 1-10; mild, moderate, or severe)     10/10.  4. PATTERN: Is the pain constant? (e.Maxwell., yes, no; constant, intermittent)      Constant.  5. RADIATION: Does the pain shoot into your legs or somewhere else?     Left leg.  6. CAUSE:  What do you think is causing the back pain?      Arthritis and sciatica.  7. BACK OVERUSE:  Any recent lifting of heavy objects, strenuous work or exercise?     Fall a month ago, landed on his left side.  8. MEDICINES: What have you taken so far for the pain? (e.Maxwell., nothing, acetaminophen, NSAIDS)     Gabapentin , Robaxin , celebrex .  9. NEUROLOGIC SYMPTOMS: Do you have any weakness, numbness, or problems with  bowel/bladder control?     No. Patient originally answered that his left foot drags and has weakness but then states he can walk on it and lift it up but is just weak and painful. Patient confirmed he is able to bear weight and ambulate on left leg.  10. OTHER SYMPTOMS: Do you have any other symptoms? (e.Maxwell., fever, abdomen pain, burning with urination, blood in urine)       No abdominal pain, fever or incontinence. He states he has urinary frequency.  Protocols used: Back Pain-A-AH

## 2024-01-23 ENCOUNTER — Ambulatory Visit: Payer: MEDICAID | Admitting: Clinical

## 2024-01-23 DIAGNOSIS — F330 Major depressive disorder, recurrent, mild: Secondary | ICD-10-CM | POA: Diagnosis not present

## 2024-01-23 NOTE — Progress Notes (Signed)
   Darice Seats, LCSW

## 2024-01-23 NOTE — Progress Notes (Signed)
 "  [facility] Counselor/Therapist Progress Note  Patient ID: Larry Maxwell, MRN: [id number],    Date: 01/23/2024  Time Spent: 10:32am - 11:24am : 52 minutes   Treatment Type: Individual Therapy  Reported Symptoms: depressed mood, decreased sleep, does not want to get out of bed or leave house at times   Mental Status Exam: Appearance:  Neat and Well Groomed     Behavior: Appropriate  Motor: Normal  Speech/Language:  Clear and Coherent and Normal Rate  Affect: Appropriate  Mood: normal  Thought process: normal  Thought content:   WNL  Sensory/Perceptual disturbances:   WNL  Orientation: oriented to person, place, time/date, and situation  Attention: Good  Concentration: Good  Memory: WNL  Fund of knowledge:  Good  Insight:   Good  Judgment:  Good  Impulse Control: Good   Risk Assessment: Danger to Self:  No Patient denied current suicidal ideation  Self-injurious Behavior: No Danger to Others: No Patient denied current homicidal ideation Duty to Warn:no Physical Aggression / Violence:No  Access to Firearms a concern: No  Gang Involvement:No   Subjective: Patient reported thoughts and bad dreams related to patient's past. Patient reported decrease in sleep and reported up and down at night in reference to sleep. Patient reported patient discussed decrease in sleep and dreams with medical provider and provider increased Zoloft  dosage. Patient stated, now I'm not having any dreams. Patient reported waking up at night with thoughts related to patient's past and a previous relationship. Patient reported the following thoughts related to patient's past: why did I go through the situation I was going through, why did they do the things they did to me, what did I do wrong, where did I go wrong. Patient reported recent holidays were a trigger for thoughts related to the past. Patient reported depressed mood, doesn't want to get out of bed or leave his home at  times.  Patient reported patient's case manager recently discussed with patient changing therapist due to insurance/coverage changes. Patient reported sleeping during day, feeling groggy when waking up, waking up at night. Patient stated, I'm alright, I feel pretty good in response to current mood.    Interventions: Cognitive Behavioral Therapy. Clinician conducted session via caregility video from clinician's home office. Patient provided verbal consent to proceed with telehealth session and is aware of limitations of telephone or video visits. Patient participated in session from patient's home. Reviewed events since last session and assessed for changes. Discussed patient's concerns related to sleep. Explored and identified thoughts associated with decrease in sleep. Discussed depressive symptoms. Explored and identified triggers for depressed mood and thoughts associated with patient's past. Provided psycho education related to depression. Discussed changing frequency of sessions to every two weeks. Reviewed patient's concerns related to insurance coverage and impact on patient's participation in therapy. Clinician requested for homework patient complete a sleep diary.     Collaboration of Care: not required at this time   Diagnosis:  Major depressive disorder, recurrent, mild     Plan: Patient is to utilize [employer] Therapy, thought re-framing, mindfulness, positive activities, and coping strategies to maintain stability in mood  Frequency: every two weeks  Modality: individual      Long-term goal:   Maintain stability in mood 7 days per week per patient's report Target Date: 09/29/24  Progress: progressing    Short-term goal:  Identify and implement strategies to prevent relapse in mood Target Date: 07/05/24  Progress: progressing    Practice coping strategies  daily to maintain stability in mood Target Date: 07/05/24  Progress: progressing    Maintain participation in  enjoyable activities 7 days per week Target Date: 07/05/24  Progress: progressing    patient will verbalize positive statements regarding self and his ability to cope with life stressors Target Date: 07/06/23  Progress: as of 09/30/23 patient reported patient feels this goal has been met    Verbalize patient's thoughts and feelings to through effective communication strategies per patient's report Target Date: 07/06/23  Progress: as of 09/30/23 patient reported patient feels this goal has been met    Identify, challenge, and replace negative thought patterns and negative self talk that contribute to feelings of depression with positive thoughts, beliefs, and positive self talk per patient's report Target Date: 07/06/23  Progress: as of 09/30/23 patient reported patient feels this goal has been met     Darice Seats, LCSW    "

## 2024-01-24 ENCOUNTER — Ambulatory Visit (INDEPENDENT_AMBULATORY_CARE_PROVIDER_SITE_OTHER): Payer: MEDICAID | Admitting: Family Medicine

## 2024-01-24 ENCOUNTER — Encounter: Payer: Self-pay | Admitting: Family Medicine

## 2024-01-24 VITALS — BP 111/82 | HR 81 | Temp 98.2°F | Ht 73.0 in | Wt 207.7 lb

## 2024-01-24 DIAGNOSIS — G8929 Other chronic pain: Secondary | ICD-10-CM | POA: Diagnosis not present

## 2024-01-24 DIAGNOSIS — M5442 Lumbago with sciatica, left side: Secondary | ICD-10-CM | POA: Diagnosis not present

## 2024-01-24 DIAGNOSIS — M79605 Pain in left leg: Secondary | ICD-10-CM | POA: Diagnosis not present

## 2024-01-24 NOTE — Progress Notes (Signed)
 "     Established patient visit   Patient: Larry Maxwell   DOB: Jan 09, 1966   58 y.o. Male  MRN: [id number] Visit Date: 01/24/2024  Today's healthcare provider: LAURAINE LOISE BUOY, DO   Chief Complaint  Patient presents with   Follow-up    Patient is here for 2 month follow up, reports that his lower back is killing him and he has been dealing with this for about 2 weeks.   Subjective    HPI IZEAH Maxwell is a 58 year old male with chronic low back pain and sciatica who presents with worsening pain and difficulty obtaining an MRI.  He has experienced worsening of his chronic low back pain for several months, with the pain radiating down the back of his left leg. The pain is severe, affecting daily activities, and worsens with movement. Current medications include gabapentin  and Celebrex , which provide insufficient relief.  He has a history of arthritis in his back, with previous x-rays showing issues with his L1 and L5 vertebrae. He completed two full sessions of physical therapy this year, with the most recent ending in October, but only experienced partial relief.  He has been denied an MRI by Medicaid, which is necessary before receiving epidural steroid injections, a treatment that has been beneficial in the past. He is frustrated with the denial, believing the MRI is crucial for further treatment.  He also experiences numbness and tingling in his left arm. He uses a pillow under or between his legs at night to alleviate discomfort, indicating the impact on his sleep.  He notes approval for disability due to his back pain, a longstanding issue. Despite efforts to stay active, he notes that even minimal movement now can trigger significant pain. No swelling in the leg, but numbness and tingling in the left arm are present.      Medications: Show/hide medication list[1]  Review of Systems  Musculoskeletal:  Positive for arthralgias and back pain.  Skin:  Negative for color  change and rash.  Neurological:  Positive for numbness (Numbness and tingling in the left arm since June.).        Objective    BP 111/82 (BP Location: Right Arm, Patient Position: Sitting, Cuff Size: Large)   Pulse 81   Temp 98.2 F (36.8 C) (Oral)   Ht 6' 1 (1.854 m)   Wt 207 lb 11.2 oz (94.2 kg)   SpO2 99%   BMI 27.40 kg/m     Physical Exam Vitals and nursing note reviewed.  Constitutional:      General: He is not in acute distress.    Appearance: Normal appearance.  HENT:     Head: Normocephalic and atraumatic.  Eyes:     General: No scleral icterus.    Conjunctiva/sclera: Conjunctivae normal.  Cardiovascular:     Rate and Rhythm: Normal rate.  Pulmonary:     Effort: Pulmonary effort is normal.  Skin:    Comments: +severe tenderness to palpation overlying varicosities of left posterior knee.  Neurological:     Mental Status: He is alert and oriented to person, place, and time. Mental status is at baseline.  Psychiatric:        Mood and Affect: Mood normal.        Behavior: Behavior normal.      No results found for any visits on 01/24/24.  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 Pain radiates down the left leg. Gabapentin  and Celebrex  used for pain management. Has undergone physical therapy (two full sessions of two visits per week for three months) in the course of the past year with only partial improvement of his pain; his most recent sessions ended 11/23/2023. He continues to experience ongoing pain along the top of his buttocks on the left, extending down the back of his leg. Pain is worsened by both flexion and extension. Needs MRI to rule out  HNP/stenosis in order to be able to have ESI  - Resent MRI request for spine evaluation. - Continue gabapentin  and Celebrex  for pain management.  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 - Ordered ultrasound of the left leg to evaluate for varicose veins.    Return in about 1 month (around 02/24/2024) for CPE, Chronic f/u.      I discussed the assessment and treatment plan with the patient  The patient was provided an opportunity to ask questions and all were answered. The patient agreed with the plan and demonstrated an understanding of the instructions.   The patient was advised to call back or seek an in-person evaluation if the symptoms worsen or if the condition fails to improve as anticipated.    LAURAINE LOISE BUOY, DO  [facility] [phone] (phone) [phone] (fax)  [facility]    [1]  Outpatient Medications Prior to Visit  Medication Sig   albuterol  (VENTOLIN  HFA) 108 (90 Base) MCG/ACT inhaler Inhale 2 puffs into the lungs every 6 (six) hours as needed for wheezing or shortness of breath.   amLODipine  (NORVASC ) 10 MG tablet Take 1 tablet (10 mg total) by mouth daily.   benzonatate  (TESSALON ) 100 MG capsule Take 1 capsule (100 mg total) by mouth 2 (two) times daily as needed for cough.   celecoxib  (CELEBREX ) 100 MG capsule Take 1 capsule (100 mg total) by mouth 2 (two) times daily.   chlorthalidone  (HYGROTON ) 25 MG tablet Take 1 tablet (25 mg total) by mouth daily.   fluticasone -salmeterol (ADVAIR  HFA) 230-21 MCG/ACT inhaler Inhale 2 puffs into the lungs 2 (two) times daily.   gabapentin  (NEURONTIN ) 600 MG tablet Take 1 tablet (600 mg total) by mouth 3 (three) times daily.   ipratropium-albuterol  (DUONEB) 0.5-2.5 (3) MG/3ML SOLN Take 3 mLs by nebulization every 6 (six) hours as needed.   loratadine  (CLARITIN ) 10 MG tablet Take 1 tablet (10 mg total) by mouth daily.   methocarbamol   (ROBAXIN ) 500 MG tablet Take 1 tablet (500 mg total) by mouth every 6 (six) hours as needed for muscle spasms.   montelukast  (SINGULAIR ) 10 MG tablet Take 1 tablet (10 mg total) by mouth at bedtime.   naloxone (NARCAN) nasal spray 4 mg/0.1 mL Place into the nose.   omeprazole  (PRILOSEC) 20 MG capsule Take 1 capsule (20 mg total) by mouth daily.   rosuvastatin  (CRESTOR ) 10 MG tablet Take 1 tablet (10 mg total) by mouth daily.   sertraline  (ZOLOFT ) 100 MG tablet Take 1 tablet (100 mg total) by mouth daily.   Spacer/Aero-Holding Chambers (AEROCHAMBER MINI CHAMBER) DEVI Use as directed   umeclidinium bromide  (INCRUSE ELLIPTA ) 62.5 MCG/ACT AEPB Inhale  1 puff into the lungs daily.   valsartan  (DIOVAN ) 80 MG tablet Take 1 tablet (80 mg total) by mouth daily.   No facility-administered medications prior to visit.   "

## 2024-01-25 ENCOUNTER — Ambulatory Visit
Admission: RE | Admit: 2024-01-25 | Discharge: 2024-01-25 | Disposition: A | Discharge status: Home / Self Care | Payer: MEDICAID | Source: Ambulatory Visit | Attending: Family Medicine | Admitting: Family Medicine

## 2024-01-25 DIAGNOSIS — M79605 Pain in left leg: Secondary | ICD-10-CM | POA: Diagnosis present

## 2024-01-31 ENCOUNTER — Ambulatory Visit: Admission: RE | Admit: 2024-01-31 | Payer: MEDICAID | Source: Ambulatory Visit

## 2024-02-01 ENCOUNTER — Telehealth: Payer: Self-pay

## 2024-02-01 ENCOUNTER — Other Ambulatory Visit: Payer: MEDICAID

## 2024-02-01 NOTE — Telephone Encounter (Signed)
 Copied from CRM #[id number]. Topic: Clinical - Medication Question >> Feb 01, 2024  9:12 AM Montie POUR wrote: Reason for CRM:  Larry Maxwell needs to see if Dr. Donzella would please call him in medication to help with his claustrophobia while he is having an MRI tomorrow morning at 10:30 AM. Please send order to [facility] MRI Department by tomorrow morning.

## 2024-02-02 ENCOUNTER — Ambulatory Visit
Admission: RE | Admit: 2024-02-02 | Discharge: 2024-02-02 | Disposition: A | Discharge status: Home / Self Care | Payer: MEDICAID | Source: Ambulatory Visit | Attending: Family Medicine | Admitting: Family Medicine

## 2024-02-02 DIAGNOSIS — M5442 Lumbago with sciatica, left side: Secondary | ICD-10-CM | POA: Diagnosis present

## 2024-02-02 DIAGNOSIS — G8929 Other chronic pain: Secondary | ICD-10-CM | POA: Diagnosis present

## 2024-02-03 ENCOUNTER — Other Ambulatory Visit (HOSPITAL_COMMUNITY): Payer: Self-pay

## 2024-02-03 ENCOUNTER — Telehealth: Payer: Self-pay | Admitting: Family Medicine

## 2024-02-03 ENCOUNTER — Ambulatory Visit: Payer: Self-pay | Admitting: Family Medicine

## 2024-02-03 ENCOUNTER — Other Ambulatory Visit: Payer: Self-pay

## 2024-02-03 NOTE — Telephone Encounter (Unsigned)
 Copied from CRM [id number]. Topic: Clinical - Order For Equipment >> Feb 03, 2024  8:06 AM Tiffini S wrote: Reason for CRM: : Patient asked for a prescription for a single foldable walking cain- needs to a foldable cain/ he was given a prescription that he lost with other documents in his transportation ride. The cain helps with balance for waking       Please call the patient back with an update at [phone]

## 2024-02-07 ENCOUNTER — Ambulatory Visit: Payer: MEDICAID | Admitting: Clinical

## 2024-02-07 DIAGNOSIS — F330 Major depressive disorder, recurrent, mild: Secondary | ICD-10-CM

## 2024-02-07 NOTE — Progress Notes (Signed)
 "  [facility] Counselor/Therapist Progress Note  Patient ID: Larry Maxwell, MRN: [id number],    Date: 02/07/2024  Time Spent: 8:50am - 9:34am : 44 minutes   Treatment Type: Individual Therapy  Reported Symptoms:  experiencing back pain during session, irritability at times, fatigue, crying at times  Mental Status Exam: Appearance:  Neat and Well Groomed     Behavior: Appropriate  Motor: Normal  Speech/Language:  Clear and Coherent and Normal Rate  Affect: Appropriate  Mood: normal  Thought process: normal  Thought content:   WNL  Sensory/Perceptual disturbances:   Patient reported experiencing pain in patient's back during session  Orientation: oriented to person, place, time/date, and situation  Attention: Good  Concentration: Good  Memory: WNL  Fund of knowledge:  Good  Insight:   Good  Judgment:  Good  Impulse Control: Good   Risk Assessment: Danger to Self:  No Patient denied current suicidal ideation  Self-injurious Behavior: No Danger to Others: No Patient denied current homicidal ideation Duty to Warn:no Physical Aggression / Violence:No  Access to Firearms a concern: No  Gang Involvement:No   Subjective:  Patient stated, things have really changed, I was dealing with so much, not getting as much sleep because I've been in so much pain. Patient reported diagnoses of a slipped disc, arthritis, and scoliosis in patient's back. Patient reported back pain and pain that radiates down patient's left leg. Patient reported back pain is impacting patient's sleep. Patient stated, I'm in so much pain. Patient stated, I'm trying to weigh my options right now in reference to treatment options. Patient reported pain after sitting and reported difficulty walking at times. Patient reported patient plans to discuss treatment options with provider today. Patient stated, I'm trying to deal with the pain the best way I can. Patient reported irritability and  fatigue. Patient reported crying at times due to pain. Patient stated, I can't get no relief. Patient stated,  I kept telling myself I can do this in reference to recent MRI. Patient reported waking up 2-3 times per night due to pain.   Interventions: Cognitive Behavioral Therapy. Clinician conducted session in person at clinician's office at [facility]. Reviewed events since last session and assessed for changes. Provided supportive therapy and active listening as patient discussed back pain. Explored impact of back pain on patient's mood and sleep. Reviewed patient's sleep diary. Reviewed use of positive self talk. Clinician requested for homework patient continue sleep diary.    Collaboration of Care: not required at this time   Diagnosis:  Major depressive disorder, recurrent, mild     Plan: Patient is to utilize [employer], thought re-framing, mindfulness, positive activities, and coping strategies to maintain stability in mood  Frequency: every two weeks  Modality: individual      Long-term goal:   Maintain stability in mood 7 days per week per patient's report Target Date: 09/29/24  Progress: progressing    Short-term goal:  Identify and implement strategies to prevent relapse in mood Target Date: 07/05/24  Progress: progressing    Practice coping strategies daily to maintain stability in mood Target Date: 07/05/24  Progress: progressing    Maintain participation in enjoyable activities 7 days per week Target Date: 07/05/24  Progress: progressing    patient will verbalize positive statements regarding self and his ability to cope with life stressors Target Date: 07/06/23  Progress: as of 09/30/23 patient reported patient feels this goal has been met    Verbalize patient's  thoughts and feelings to through effective communication strategies per patient's report Target Date: 07/06/23  Progress: as of 09/30/23 patient reported patient feels this goal has  been met    Identify, challenge, and replace negative thought patterns and negative self talk that contribute to feelings of depression with positive thoughts, beliefs, and positive self talk per patient's report Target Date: 07/06/23  Progress: as of 09/30/23 patient reported patient feels this goal has been met    Darice Seats, LCSW    "

## 2024-02-07 NOTE — Progress Notes (Signed)
   Darice Seats, LCSW

## 2024-02-09 ENCOUNTER — Ambulatory Visit: Admitting: Family Medicine

## 2024-02-13 ENCOUNTER — Other Ambulatory Visit (HOSPITAL_COMMUNITY): Payer: Self-pay

## 2024-02-13 ENCOUNTER — Other Ambulatory Visit: Payer: Self-pay

## 2024-02-14 ENCOUNTER — Other Ambulatory Visit: Payer: Self-pay

## 2024-02-14 ENCOUNTER — Other Ambulatory Visit (HOSPITAL_COMMUNITY): Payer: Self-pay

## 2024-02-14 ENCOUNTER — Encounter (HOSPITAL_COMMUNITY): Payer: Self-pay

## 2024-02-14 MED ORDER — METHYLPREDNISOLONE 4 MG PO TBPK
ORAL_TABLET | ORAL | 0 refills | Status: AC
  Filled 2024-02-14 (×2): qty 21, 6d supply, fill #0

## 2024-02-14 MED ORDER — TRAMADOL HCL 50 MG PO TABS
50.0000 mg | ORAL_TABLET | Freq: Four times a day (QID) | ORAL | 0 refills | Status: AC | PRN
  Filled 2024-02-14 (×2): qty 20, 5d supply, fill #0

## 2024-02-15 ENCOUNTER — Encounter: Payer: Self-pay | Admitting: Neurosurgery

## 2024-02-17 ENCOUNTER — Other Ambulatory Visit: Payer: Self-pay

## 2024-02-20 ENCOUNTER — Ambulatory Visit: Payer: MEDICAID | Admitting: Pulmonary Disease

## 2024-02-21 ENCOUNTER — Other Ambulatory Visit: Payer: Self-pay

## 2024-02-21 MED ORDER — LORAZEPAM 1 MG PO TABS
ORAL_TABLET | ORAL | 0 refills | Status: AC
  Filled 2024-02-21: qty 2, 1d supply, fill #0

## 2024-02-22 ENCOUNTER — Ambulatory Visit: Payer: MEDICAID | Admitting: Clinical

## 2024-02-24 ENCOUNTER — Encounter: Payer: Self-pay | Admitting: Neurosurgery

## 2024-02-27 ENCOUNTER — Encounter: Payer: MEDICAID | Admitting: Family Medicine

## 2024-02-27 ENCOUNTER — Other Ambulatory Visit (HOSPITAL_COMMUNITY): Payer: Self-pay

## 2024-02-28 ENCOUNTER — Encounter: Payer: MEDICAID | Admitting: Family Medicine

## 2024-03-07 ENCOUNTER — Ambulatory Visit: Payer: MEDICAID | Admitting: Clinical

## 2024-03-09 ENCOUNTER — Encounter: Payer: MEDICAID | Admitting: Family Medicine

## 2024-03-12 ENCOUNTER — Ambulatory Visit: Payer: MEDICAID | Admitting: Pulmonary Disease

## 2024-03-13 ENCOUNTER — Ambulatory Visit: Payer: MEDICAID | Admitting: Neurosurgery

## 2024-03-20 ENCOUNTER — Ambulatory Visit: Payer: MEDICAID | Admitting: Clinical
